# Patient Record
Sex: Male | Born: 1935 | Race: White | Hispanic: No | Smoking: Former smoker
Health system: Southern US, Community
[De-identification: ages and names within clinical notes are randomized; demographics above are authoritative.]

## PROBLEM LIST (undated history)

## (undated) DIAGNOSIS — I4891 Unspecified atrial fibrillation: Secondary | ICD-10-CM

## (undated) DIAGNOSIS — E785 Hyperlipidemia, unspecified: Secondary | ICD-10-CM

## (undated) DIAGNOSIS — M1990 Unspecified osteoarthritis, unspecified site: Secondary | ICD-10-CM

## (undated) DIAGNOSIS — IMO0001 Reserved for inherently not codable concepts without codable children: Secondary | ICD-10-CM

## (undated) DIAGNOSIS — C8590 Non-Hodgkin lymphoma, unspecified, unspecified site: Secondary | ICD-10-CM

## (undated) DIAGNOSIS — I10 Essential (primary) hypertension: Secondary | ICD-10-CM

## (undated) DIAGNOSIS — Z9089 Acquired absence of other organs: Secondary | ICD-10-CM

## (undated) DIAGNOSIS — I2510 Atherosclerotic heart disease of native coronary artery without angina pectoris: Secondary | ICD-10-CM

## (undated) DIAGNOSIS — N400 Enlarged prostate without lower urinary tract symptoms: Secondary | ICD-10-CM

## (undated) DIAGNOSIS — D0360 Melanoma in situ of unspecified upper limb, including shoulder: Secondary | ICD-10-CM

## (undated) DIAGNOSIS — I629 Nontraumatic intracranial hemorrhage, unspecified: Secondary | ICD-10-CM

## (undated) DIAGNOSIS — D696 Thrombocytopenia, unspecified: Secondary | ICD-10-CM

## (undated) DIAGNOSIS — E119 Type 2 diabetes mellitus without complications: Secondary | ICD-10-CM

## (undated) HISTORY — DX: Non-Hodgkin lymphoma, unspecified, unspecified site: C85.90

## (undated) HISTORY — DX: Hyperlipidemia, unspecified: E78.5

## (undated) HISTORY — DX: Type 2 diabetes mellitus without complications: E11.9

## (undated) HISTORY — DX: Essential (primary) hypertension: I10

## (undated) HISTORY — DX: Acquired absence of other organs: Z90.89

## (undated) HISTORY — DX: Thrombocytopenia, unspecified: D69.6

## (undated) HISTORY — DX: Nontraumatic intracranial hemorrhage, unspecified: I62.9

## (undated) HISTORY — DX: Melanoma in situ of unspecified upper limb, including shoulder: D03.60

## (undated) HISTORY — DX: Reserved for inherently not codable concepts without codable children: IMO0001

## (undated) HISTORY — PX: CHOLECYSTECTOMY: SHX55

## (undated) HISTORY — PX: TONSILLECTOMY AND ADENOIDECTOMY: SUR1326

## (undated) HISTORY — DX: Unspecified atrial fibrillation: I48.91

## (undated) HISTORY — DX: Atherosclerotic heart disease of native coronary artery without angina pectoris: I25.10

## (undated) HISTORY — DX: Unspecified osteoarthritis, unspecified site: M19.90

## (undated) HISTORY — PX: CATARACT EXTRACTION: SUR2

## (undated) HISTORY — DX: Benign prostatic hyperplasia without lower urinary tract symptoms: N40.0

---
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
## 2002-07-11 ENCOUNTER — Encounter: Payer: Self-pay | Admitting: Internal Medicine

## 2005-07-14 ENCOUNTER — Encounter: Payer: Self-pay | Admitting: Internal Medicine

## 2005-12-28 ENCOUNTER — Ambulatory Visit: Payer: Self-pay | Admitting: Internal Medicine

## 2006-02-01 ENCOUNTER — Ambulatory Visit: Payer: Self-pay | Admitting: Cardiology

## 2006-02-22 ENCOUNTER — Ambulatory Visit: Payer: Self-pay

## 2006-02-22 ENCOUNTER — Encounter: Payer: Self-pay | Admitting: Internal Medicine

## 2006-03-30 ENCOUNTER — Ambulatory Visit: Payer: Self-pay | Admitting: Internal Medicine

## 2006-05-29 ENCOUNTER — Encounter: Payer: Self-pay | Admitting: Internal Medicine

## 2006-07-28 DIAGNOSIS — I4891 Unspecified atrial fibrillation: Secondary | ICD-10-CM | POA: Insufficient documentation

## 2006-07-28 DIAGNOSIS — E785 Hyperlipidemia, unspecified: Secondary | ICD-10-CM

## 2006-07-28 DIAGNOSIS — E1142 Type 2 diabetes mellitus with diabetic polyneuropathy: Secondary | ICD-10-CM | POA: Insufficient documentation

## 2006-07-28 DIAGNOSIS — I10 Essential (primary) hypertension: Secondary | ICD-10-CM

## 2006-07-28 DIAGNOSIS — I2510 Atherosclerotic heart disease of native coronary artery without angina pectoris: Secondary | ICD-10-CM

## 2006-07-28 DIAGNOSIS — N138 Other obstructive and reflux uropathy: Secondary | ICD-10-CM

## 2006-07-28 DIAGNOSIS — N401 Enlarged prostate with lower urinary tract symptoms: Secondary | ICD-10-CM

## 2006-08-03 ENCOUNTER — Ambulatory Visit: Payer: Self-pay | Admitting: Internal Medicine

## 2006-08-04 LAB — CONVERTED CEMR LAB
ALT: 20 units/L (ref 0–40)
BUN: 10 mg/dL (ref 6–23)
CO2: 33 meq/L — ABNORMAL HIGH (ref 19–32)
Calcium: 9.7 mg/dL (ref 8.4–10.5)
Chloride: 100 meq/L (ref 96–112)
Cholesterol: 151 mg/dL (ref 0–200)
Creatinine, Ser: 0.9 mg/dL (ref 0.4–1.5)
GFR calc Af Amer: 104 mL/min
GFR calc non Af Amer: 86 mL/min
Glucose, Bld: 83 mg/dL (ref 70–99)
HDL: 47.1 mg/dL (ref >39.0)
Hgb A1c MFr Bld: 6.1 % — ABNORMAL HIGH (ref 4.6–6.0)
LDL Cholesterol: 88 mg/dL (ref 0–99)
Potassium: 4 meq/L (ref 3.5–5.1)
Sodium: 139 meq/L (ref 135–145)
Total CHOL/HDL Ratio: 3.2
Triglycerides: 82 mg/dL (ref 0–149)
VLDL: 16 mg/dL (ref 0–40)

## 2006-12-05 ENCOUNTER — Ambulatory Visit: Payer: Self-pay | Admitting: Internal Medicine

## 2006-12-06 LAB — CONVERTED CEMR LAB
Albumin: 4.6 g/dL (ref 3.5–5.2)
BUN: 8 mg/dL (ref 6–23)
CO2: 33 meq/L — ABNORMAL HIGH (ref 19–32)
Calcium: 9.9 mg/dL (ref 8.4–10.5)
Chloride: 97 meq/L (ref 96–112)
Creatinine, Ser: 0.7 mg/dL (ref 0.4–1.5)
Creatinine,U: 18 mg/dL
GFR calc Af Amer: 140 mL/min
GFR calc non Af Amer: 115 mL/min
Glucose, Bld: 91 mg/dL (ref 70–99)
Hgb A1c MFr Bld: 5.9 % (ref 4.6–6.0)
Microalb Creat Ratio: 11.1 mg/g (ref 0.0–30.0)
Microalb, Ur: 0.2 mg/dL (ref 0.0–1.9)
Phosphorus: 3.7 mg/dL (ref 2.3–4.6)
Potassium: 4.2 meq/L (ref 3.5–5.1)
Sodium: 136 meq/L (ref 135–145)

## 2007-01-05 ENCOUNTER — Encounter: Payer: Self-pay | Admitting: Internal Medicine

## 2007-01-29 ENCOUNTER — Ambulatory Visit: Payer: Self-pay | Admitting: Internal Medicine

## 2007-04-10 ENCOUNTER — Ambulatory Visit: Payer: Self-pay | Admitting: Internal Medicine

## 2007-04-11 LAB — CONVERTED CEMR LAB
ALT: 19 units/L (ref 0–53)
AST: 25 units/L (ref 0–37)
Albumin: 4.8 g/dL (ref 3.5–5.2)
Alkaline Phosphatase: 41 units/L (ref 39–117)
BUN: 6 mg/dL (ref 6–23)
Bilirubin, Direct: 0.2 mg/dL (ref 0.0–0.3)
CO2: 34 meq/L — ABNORMAL HIGH (ref 19–32)
Calcium: 10 mg/dL (ref 8.4–10.5)
Chloride: 98 meq/L (ref 96–112)
Cholesterol: 140 mg/dL (ref 0–200)
Creatinine, Ser: 0.9 mg/dL (ref 0.4–1.5)
GFR calc Af Amer: 104 mL/min
GFR calc non Af Amer: 86 mL/min
Glucose, Bld: 87 mg/dL (ref 70–99)
HDL: 49.8 mg/dL (ref >39.0)
Hgb A1c MFr Bld: 5.9 % (ref 4.6–6.0)
LDL Cholesterol: 75 mg/dL (ref 0–99)
Phosphorus: 3.7 mg/dL (ref 2.3–4.6)
Potassium: 4.4 meq/L (ref 3.5–5.1)
Sodium: 139 meq/L (ref 135–145)
Total Bilirubin: 1.1 mg/dL (ref 0.3–1.2)
Total CHOL/HDL Ratio: 2.8
Total Protein: 7.2 g/dL (ref 6.0–8.3)
Triglycerides: 76 mg/dL (ref 0–149)
VLDL: 15 mg/dL (ref 0–40)

## 2007-05-09 ENCOUNTER — Ambulatory Visit: Payer: Self-pay | Admitting: Cardiology

## 2007-08-10 ENCOUNTER — Ambulatory Visit: Payer: Self-pay | Admitting: Internal Medicine

## 2007-08-14 LAB — CONVERTED CEMR LAB
ALT: 18 units/L (ref 0–53)
AST: 23 units/L (ref 0–37)
Albumin: 4.8 g/dL (ref 3.5–5.2)
Alkaline Phosphatase: 39 units/L (ref 39–117)
BUN: 8 mg/dL (ref 6–23)
Basophils Absolute: 0 10*3/uL (ref 0.0–0.1)
Basophils Relative: 0.3 % (ref 0.0–1.0)
Bilirubin, Direct: 0.1 mg/dL (ref 0.0–0.3)
CO2: 32 meq/L (ref 19–32)
Calcium: 9.9 mg/dL (ref 8.4–10.5)
Chloride: 98 meq/L (ref 96–112)
Creatinine, Ser: 0.8 mg/dL (ref 0.4–1.5)
Eosinophils Absolute: 0 10*3/uL (ref 0.0–0.7)
Eosinophils Relative: 1 % (ref 0.0–5.0)
GFR calc Af Amer: 119 mL/min
GFR calc non Af Amer: 99 mL/min
Glucose, Bld: 79 mg/dL (ref 70–99)
HCT: 44.8 % (ref 39.0–52.0)
Hemoglobin: 15.7 g/dL (ref 13.0–17.0)
Hgb A1c MFr Bld: 6.1 % — ABNORMAL HIGH (ref 4.6–6.0)
Lymphocytes Relative: 39.7 % (ref 12.0–46.0)
MCHC: 35 g/dL (ref 30.0–36.0)
MCV: 96.3 fL (ref 78.0–100.0)
Monocytes Absolute: 0.6 10*3/uL (ref 0.1–1.0)
Monocytes Relative: 14.6 % — ABNORMAL HIGH (ref 3.0–12.0)
Neutro Abs: 2 10*3/uL (ref 1.4–7.7)
Neutrophils Relative %: 44.4 % (ref 43.0–77.0)
Phosphorus: 3 mg/dL (ref 2.3–4.6)
Platelets: 165 10*3/uL (ref 150–400)
Potassium: 4.2 meq/L (ref 3.5–5.1)
RBC: 4.65 M/uL (ref 4.22–5.81)
RDW: 12.3 % (ref 11.5–14.6)
Sodium: 140 meq/L (ref 135–145)
TSH: 0.66 microintl units/mL (ref 0.35–5.50)
Total Bilirubin: 1.1 mg/dL (ref 0.3–1.2)
Total Protein: 7.4 g/dL (ref 6.0–8.3)
WBC: 4.3 10*3/uL — ABNORMAL LOW (ref 4.5–10.5)

## 2007-12-10 ENCOUNTER — Ambulatory Visit: Payer: Self-pay | Admitting: Internal Medicine

## 2008-04-11 ENCOUNTER — Ambulatory Visit: Payer: Self-pay | Admitting: Internal Medicine

## 2008-04-14 LAB — CONVERTED CEMR LAB
ALT: 16 U/L
AST: 21 U/L
Albumin: 4.5 g/dL
Alkaline Phosphatase: 39 U/L
BUN: 11 mg/dL
Basophils Absolute: 0 K/uL
Basophils Relative: 0.4 %
Bilirubin, Direct: 0.2 mg/dL
CO2: 32 meq/L
Calcium: 9.6 mg/dL
Chloride: 99 meq/L
Cholesterol: 128 mg/dL
Creatinine, Ser: 0.8 mg/dL
Creatinine,U: 32.1 mg/dL
Eosinophils Absolute: 0 K/uL
Eosinophils Relative: 0.7 %
GFR calc Af Amer: 119 mL/min
GFR calc non Af Amer: 99 mL/min
Glucose, Bld: 89 mg/dL
HCT: 43.3 %
HDL: 44.7 mg/dL
Hemoglobin: 15 g/dL
Hgb A1c MFr Bld: 6.1 % — ABNORMAL HIGH
LDL Cholesterol: 69 mg/dL
Lymphocytes Relative: 36.1 %
MCHC: 34.7 g/dL
MCV: 97 fL
Microalb Creat Ratio: 24.9 mg/g
Microalb, Ur: 0.8 mg/dL
Monocytes Absolute: 0.6 K/uL
Monocytes Relative: 13.2 % — ABNORMAL HIGH
Neutro Abs: 2.3 K/uL
Neutrophils Relative %: 49.6 %
Phosphorus: 3.9 mg/dL
Platelets: 156 K/uL
Potassium: 4.3 meq/L
RBC: 4.46 M/uL
RDW: 12.3 %
Sodium: 137 meq/L
TSH: 2.23 u[IU]/mL
Total Bilirubin: 1.1 mg/dL
Total CHOL/HDL Ratio: 2.9
Total Protein: 6.7 g/dL
Triglycerides: 71 mg/dL
VLDL: 14 mg/dL
WBC: 4.5 10*3/microliter

## 2008-05-06 ENCOUNTER — Ambulatory Visit: Payer: Self-pay | Admitting: Cardiology

## 2008-06-12 ENCOUNTER — Telehealth: Payer: Self-pay | Admitting: Internal Medicine

## 2008-08-04 ENCOUNTER — Ambulatory Visit: Payer: Self-pay | Admitting: Internal Medicine

## 2008-08-29 ENCOUNTER — Ambulatory Visit: Payer: Self-pay | Admitting: Family Medicine

## 2009-01-16 ENCOUNTER — Ambulatory Visit: Payer: Self-pay | Admitting: Internal Medicine

## 2009-01-19 LAB — CONVERTED CEMR LAB
ALT: 23 units/L (ref 0–53)
AST: 31 units/L (ref 0–37)
Alkaline Phosphatase: 40 units/L (ref 39–117)
BUN: 8 mg/dL (ref 6–23)
Basophils Absolute: 0 10*3/uL (ref 0.0–0.1)
Bilirubin, Direct: 0.2 mg/dL (ref 0.0–0.3)
Calcium: 9.5 mg/dL (ref 8.4–10.5)
Cholesterol: 139 mg/dL (ref 0–200)
Creatinine, Ser: 0.7 mg/dL (ref 0.4–1.5)
Eosinophils Absolute: 0 10*3/uL (ref 0.0–0.7)
GFR calc non Af Amer: 114.55 mL/min (ref >60)
Glucose, Bld: 92 mg/dL (ref 70–99)
Lymphocytes Relative: 29.8 % (ref 12.0–46.0)
MCHC: 34.6 g/dL (ref 30.0–36.0)
Monocytes Relative: 12.6 % — ABNORMAL HIGH (ref 3.0–12.0)
Phosphorus: 3.9 mg/dL (ref 2.3–4.6)
Platelets: 144 10*3/uL — ABNORMAL LOW (ref 150.0–400.0)
Potassium: 4.1 meq/L (ref 3.5–5.1)
RDW: 12.5 % (ref 11.5–14.6)
TSH: 2.47 microintl units/mL (ref 0.35–5.50)
Total Protein: 7 g/dL (ref 6.0–8.3)
VLDL: 14.6 mg/dL (ref 0.0–40.0)

## 2009-04-06 ENCOUNTER — Encounter: Payer: Self-pay | Admitting: Internal Medicine

## 2009-06-05 ENCOUNTER — Ambulatory Visit: Payer: Self-pay | Admitting: Cardiovascular Disease

## 2009-06-09 ENCOUNTER — Telehealth: Payer: Self-pay | Admitting: Internal Medicine

## 2009-06-10 ENCOUNTER — Ambulatory Visit: Payer: Self-pay | Admitting: Internal Medicine

## 2009-06-19 ENCOUNTER — Ambulatory Visit: Payer: Self-pay | Admitting: Cardiovascular Disease

## 2009-08-07 ENCOUNTER — Ambulatory Visit: Payer: Self-pay | Admitting: Internal Medicine

## 2009-08-11 LAB — CONVERTED CEMR LAB
Basophils Relative: 0.5 % (ref 0.0–3.0)
Eosinophils Absolute: 0 10*3/uL (ref 0.0–0.7)
Eosinophils Relative: 0.7 % (ref 0.0–5.0)
Hgb A1c MFr Bld: 5.8 % (ref 4.6–6.5)
Lymphocytes Relative: 34 % (ref 12.0–46.0)
MCHC: 34.2 g/dL (ref 30.0–36.0)
MCV: 97.6 fL (ref 78.0–100.0)
Monocytes Absolute: 0.6 10*3/uL (ref 0.1–1.0)
Neutrophils Relative %: 50.3 % (ref 43.0–77.0)
Platelets: 132 10*3/uL — ABNORMAL LOW (ref 150.0–400.0)
RBC: 4.24 M/uL (ref 4.22–5.81)
WBC: 3.9 10*3/uL — ABNORMAL LOW (ref 4.5–10.5)

## 2009-10-08 ENCOUNTER — Encounter: Payer: Self-pay | Admitting: Internal Medicine

## 2009-11-03 ENCOUNTER — Ambulatory Visit: Payer: Self-pay | Admitting: Cardiovascular Disease

## 2009-12-14 ENCOUNTER — Ambulatory Visit: Payer: Self-pay | Admitting: Internal Medicine

## 2010-01-04 ENCOUNTER — Telehealth: Payer: Self-pay | Admitting: Internal Medicine

## 2010-01-04 ENCOUNTER — Telehealth: Payer: Self-pay | Admitting: Cardiovascular Disease

## 2010-01-08 ENCOUNTER — Encounter: Payer: Self-pay | Admitting: Internal Medicine

## 2010-03-22 ENCOUNTER — Encounter: Payer: Self-pay | Admitting: Internal Medicine

## 2010-03-22 LAB — HM DIABETES EYE EXAM

## 2010-03-31 ENCOUNTER — Other Ambulatory Visit: Payer: Self-pay | Admitting: Internal Medicine

## 2010-03-31 ENCOUNTER — Ambulatory Visit
Admission: RE | Admit: 2010-03-31 | Discharge: 2010-03-31 | Payer: Self-pay | Source: Home / Self Care | Attending: Internal Medicine | Admitting: Internal Medicine

## 2010-03-31 LAB — CBC WITH DIFFERENTIAL/PLATELET
Basophils Absolute: 0 10*3/uL (ref 0.0–0.1)
Basophils Relative: 0.4 % (ref 0.0–3.0)
Eosinophils Absolute: 0 10*3/uL (ref 0.0–0.7)
Eosinophils Relative: 1 % (ref 0.0–5.0)
HCT: 38.5 % — ABNORMAL LOW (ref 39.0–52.0)
Hemoglobin: 13.7 g/dL (ref 13.0–17.0)
Lymphocytes Relative: 33.7 % (ref 12.0–46.0)
Lymphs Abs: 1.5 10*3/uL (ref 0.7–4.0)
MCHC: 35.6 g/dL (ref 30.0–36.0)
MCV: 96.5 fl (ref 78.0–100.0)
Monocytes Absolute: 0.5 10*3/uL (ref 0.1–1.0)
Monocytes Relative: 12.4 % — ABNORMAL HIGH (ref 3.0–12.0)
Neutro Abs: 2.3 10*3/uL (ref 1.4–7.7)
Neutrophils Relative %: 52.5 % (ref 43.0–77.0)
Platelets: 119 10*3/uL — ABNORMAL LOW (ref 150.0–400.0)
RBC: 3.99 Mil/uL — ABNORMAL LOW (ref 4.22–5.81)
RDW: 13.7 % (ref 11.5–14.6)
WBC: 4.3 10*3/uL — ABNORMAL LOW (ref 4.5–10.5)

## 2010-03-31 LAB — RENAL FUNCTION PANEL
Albumin: 4.5 g/dL (ref 3.5–5.2)
BUN: 11 mg/dL (ref 6–23)
CO2: 33 mEq/L — ABNORMAL HIGH (ref 19–32)
Calcium: 9.6 mg/dL (ref 8.4–10.5)
Chloride: 96 mEq/L (ref 96–112)
Creatinine, Ser: 0.6 mg/dL (ref 0.4–1.5)
GFR: 144.77 mL/min (ref >60.00)
Glucose, Bld: 79 mg/dL (ref 70–99)
Phosphorus: 3.6 mg/dL (ref 2.3–4.6)
Potassium: 4.8 mEq/L (ref 3.5–5.1)
Sodium: 135 mEq/L (ref 135–145)

## 2010-03-31 LAB — TSH: TSH: 2.21 u[IU]/mL (ref 0.35–5.50)

## 2010-03-31 LAB — LIPID PANEL
Cholesterol: 122 mg/dL (ref 0–200)
HDL: 54 mg/dL (ref >39.00)
LDL Cholesterol: 58 mg/dL (ref 0–99)
Total CHOL/HDL Ratio: 2
Triglycerides: 51 mg/dL (ref 0.0–149.0)
VLDL: 10.2 mg/dL (ref 0.0–40.0)

## 2010-03-31 LAB — HM DIABETES FOOT EXAM

## 2010-03-31 LAB — HEPATIC FUNCTION PANEL
ALT: 19 U/L (ref 0–53)
AST: 22 U/L (ref 0–37)
Albumin: 4.5 g/dL (ref 3.5–5.2)
Alkaline Phosphatase: 39 U/L (ref 39–117)
Bilirubin, Direct: 0.2 mg/dL (ref 0.0–0.3)
Total Bilirubin: 1.1 mg/dL (ref 0.3–1.2)
Total Protein: 6.7 g/dL (ref 6.0–8.3)

## 2010-03-31 LAB — HEMOGLOBIN A1C: Hgb A1c MFr Bld: 5.9 % (ref 4.6–6.5)

## 2010-04-13 NOTE — Assessment & Plan Note (Signed)
Summary: PULLED MUSCLE, OK PER DEE/NT   Vital Signs:  Patient profile:   77 year old male Weight:      140 pounds Temp:     98 degrees F oral BP sitting:   120 / 68  (left arm) Cuff size:   large  Vitals Entered By: Mervin Hack CMA Duncan Dull) (June 10, 2009 12:52 PM) CC: pulled muscle   History of Present Illness: Doing exercises about 1 month ago Noted sudden pain in right low back Felt it would just improve but it is worsening  Gets intermittent sharp, stabbing pain that are more severe Grabbing feeling hard to walk at times  No radiation up back or to legs No leg weakness  Hasn't used any meds, heat, etc  Allergies: No Known Drug Allergies  Past History:  Past medical, surgical, family and social histories (including risk factors) reviewed for relevance to current acute and chronic problems.  Past Medical History: Reviewed history from 08/10/2007 and no changes required. Atrial fibrillation--1980's--------------------------------------------Dr Wall Coronary artery disease--1995 Hyperlipidemia--1990's Hypertension--1990's Benign prostatic hypertrophy------------------------------------Dr Cope Lymphoma(?)--2002 NIDDM with neuropathy----------------------------------------Dr [facility] (podiatry)  Past Surgical History: Reviewed history from 07/28/2006 and no changes required. 1931 T&A 1949 LIH 1982--Cholecystectomy 1980's--Cranial hemorrhage while on coumadin 1986--melanoma (L shoulder) 1999--several nevi removed 2002--lumps on head (cancer?)  Family History: Reviewed history from 07/28/2006 and no changes required. Dad died @91  old age, had prostate cancer Mom died@65   CHF?, DM, HTN 1 sister with arthritis No history of colon cancer  Social History: Reviewed history from 12/31/2008 and no changes required. Producer, television/film/video Married--3 sons Mila [place], [place], [place]) Former Smoker--quit 1982, 20 pack year history Alcohol  use-yes Enjoys golf and bowling Regular Exercise - yes Drug Use - no  Review of Systems       No change in bowel or bladder habits  Physical Exam  General:  alert.  NAD Msk:  no spine tenderness mild spasm and tenderness along lumbar paraspinals SLR negative bilaterally fairly normal back flexion Extremities:  no edema Neurologic:  strength normal in all extremities and DTRs symmetrical and normal.   Gait not ataxic but leans forward   Impression & Recommendations:  Problem # 1:  BACK PAIN (ICD-724.5) Assessment New  clearly seems to be just muscular will try heat, very short course of muscle relaxer and NSAIDs if not improving, will make PT referral  Orders: Prescription Created Electronically [phone])  Complete Medication List: 1)  Multivitamins Caps (Multiple vitamin) .Marland Kitchen.. 1 daily 2)  Diltiazem Hcl Cr 120 Mg Xr12h-cap (Diltiazem hcl) .Marland Kitchen.. 1 tab by mouth daily 3)  Aspirin 325 Mg Tabs (Aspirin) .Marland Kitchen.. 1 daily 4)  Hydrochlorothiazide 25 Mg Tabs (Hydrochlorothiazide) .Marland Kitchen.. 1 daily 5)  Crestor 5 Mg Tabs (Rosuvastatin calcium) .Marland Kitchen.. 1 every other day 6)  Lisinopril 20 Mg Tabs (Lisinopril) .Marland Kitchen.. 1 daily 7)  Onetouch Ultra Test Strp (Glucose blood) .... Use as directed, dx: 250.00 patient tests once daily. 8)  Onetouch Ultra Control Soln (Blood glucose calibration) .... Use as directed 9)  Onetouch Ultrasoft Lancets Misc (Lancets) .... Use as directed dx: 250.00 patient tests once daily 10)  Chlorzoxazone 500 Mg Tabs (Chlorzoxazone) .Marland Kitchen.. 1 tab by mouth three times a day as needed for muscle spasm  Patient Instructions: 1)  Please try a heat rub over the painful area--like ben gay or icy hot 2)  Please try ibuprofen --  400mg  (2 of the 200mg  tabs) three times a day after meals for 1-2 weeks 3)  use the prescription muscle  relaxer if you get the sharp pains 4)  If the pain is no better in 1-2 weeks, call for referral to the physical therapists on campus for treatment 5)  Keep  your May appt Prescriptions: CHLORZOXAZONE 500 MG TABS (CHLORZOXAZONE) 1 tab by mouth three times a day as needed for muscle spasm  #30 x 0   Entered and Authorized by:   Cindee Salt MD   Signed by:   Cindee Salt MD on 06/10/2009   Method used:   Electronically to        [facility] [phone]* (retail)       [address] [place], [place]  [address]       Ph: [phone]       Fax: [phone]   RxID:   [id number]   Current Allergies (reviewed today): No known allergies

## 2010-04-13 NOTE — Letter (Signed)
Summary: [place] [facility]  [place] [facility]   Imported By: Maryln Gottron 10/12/2009 15:04:18  _____________________________________________________________________  External Attachment:    Type:   Image     Comment:   External Document

## 2010-04-13 NOTE — Progress Notes (Signed)
Summary: RX  Phone Note Refill Request Call back at Home Phone [phone] Message from:  Patient on January 04, 2010 8:46 AM  Refills Requested: Medication #1:  DILTIAZEM HCL 30 MG TABS Take 1 tablet by mouth two times a day   Notes: 90 DAY SUPPLY FOR 1 YEAR SEND IT TO THE PT'S HOME ADDRESS  Initial call taken by: Harlon Flor,  January 04, 2010 8:47 AM    Prescriptions: DILTIAZEM HCL 30 MG TABS (DILTIAZEM HCL) Take 1 tablet by mouth two times a day  #180 x 3   Entered by:   Bishop Dublin, CMA   Authorized by:   Dossie Arbour MD   Signed by:   Bishop Dublin, CMA on 01/04/2010   Method used:   Print then Give to Patient   RxID:   [id number]

## 2010-04-13 NOTE — Assessment & Plan Note (Signed)
Summary: EKG/AMD  Nurse Visit   Allergies: No Known Drug Allergies  Orders Added: 1)  EKG w/ Interpretation [93000]  Appended Document: EKG/AMD EKG looks better per Dr Mariah Milling, called pt to let him know and check on dizziness.  pt states dizziness is better. told pt to cont current regiment. Cala Bradford :)

## 2010-04-13 NOTE — Progress Notes (Signed)
Summary: shingles vaccine  Phone Note Call from Patient Call back at Home Phone [phone]   Caller: Patient Call For: Cindee Salt MD Summary of Call: Patient is asking for a rx for the shingles vaccine be faxed  to [facility] on [address]  Initial call taken by: Melody Comas,  January 04, 2010 4:47 PM  Follow-up for Phone Call        [facility] [place], Spoke with patient and advised results.  Follow-up by: Mervin Hack CMA Duncan Dull),  January 04, 2010 5:05 PM    New/Updated Medications: ZOSTAVAX [id number] UNT/0.65ML SOLR (ZOSTER VACCINE LIVE) inject 0.32ml IM Prescriptions: ZOSTAVAX [id number] UNT/0.65ML SOLR (ZOSTER VACCINE LIVE) inject 0.28ml IM  #1 x 0   Entered by:   Mervin Hack CMA (AAMA)   Authorized by:   Cindee Salt MD   Signed by:   Mervin Hack CMA (AAMA) on 01/04/2010   Method used:   Electronically to        [facility]. [address] [phone]* (retail)       [address] [place], [place]  [address]       Ph: [phone]       Fax: [phone]   RxID:   [id number]

## 2010-04-13 NOTE — Letter (Signed)
Summary: [facility]  [facility]   Imported By: Lanelle Bal 05/05/2009 09:40:11  _____________________________________________________________________  External Attachment:    Type:   Image     Comment:   External Document  Appended Document: [facility]    Clinical Lists Changes  Observations: Added new observation of DIAB EYE EX: No diabetic retinopathy.   Cataracts noted and referral made (04/26/2009 20:12)       Diabetic Eye Exam  Procedure date:  04/26/2009  Findings:      No diabetic retinopathy.   Cataracts noted and referral made

## 2010-04-13 NOTE — Miscellaneous (Signed)
Summary: ZOSTAVAX  Clinical Lists Changes  Observations: Added new observation of ZOSTAVAX: Zostavax (01/05/2010 9:46)      Other Immunization History:    Zostavax # 1:  Zostavax (01/05/2010)

## 2010-04-13 NOTE — Assessment & Plan Note (Signed)
Summary: ec6   Visit Type:  Follow-up Referring Provider:  WALL Primary Provider:  Cindee Salt MD  CC:  gets slightly lightheaded with quick changes in positions.  History of Present Illness: Jimmy Riggs is a patient of Dr. Alphonsus Sias with history of chronic atrial fibrillation, hypertension, hyperlipidemia, nonobstructive coronary artery disease and a negative stress test in December 2007 with ejection fraction 65% who presents for routine followup. He was last seen February 2010 by Dr. wall.  He states that he's been doing well he does have episodes of dizziness sometimes when he walks. He tries to walk at least a mile several times per week. He denies any chest pain, syncope, diaphoresis. No significant lower extremity edema. He's been taking his medications without any difficulty.  Labs from 2010 shows well-controlled cholesterol of 130, LDL 70, HDL greater than 40  Current Problems (verified): 1)  Uri  (ICD-465.9) 2)  Chest Pain  (ICD-786.50) 3)  Carbuncle and Furuncle of Buttock  (ICD-680.5) 4)  Diabetes Mellitus, Type II, With Neurological Complications  (ICD-250.60) 5)  Benign Prostatic Hypertrophy  (ICD-600.00) 6)  Hypertension  (ICD-401.9) 7)  Hyperlipidemia  (ICD-272.4) 8)  Coronary Artery Disease  (ICD-414.00) 9)  Atrial Fibrillation  (ICD-427.31)  Current Medications (verified): 1)  Multivitamins  Caps (Multiple Vitamin) .Marland Kitchen.. 1 Daily 2)  Cartia Xt 240 Mg Cp24 (Diltiazem Hcl Coated Beads) .Marland Kitchen.. 1 Daily 3)  Aspirin 325 Mg Tabs (Aspirin) .Marland Kitchen.. 1 Daily 4)  Hydrochlorothiazide 25 Mg Tabs (Hydrochlorothiazide) .Marland Kitchen.. 1 Daily 5)  Crestor 5 Mg Tabs (Rosuvastatin Calcium) .Marland Kitchen.. 1 Every Other Day 6)  Lisinopril 20 Mg Tabs (Lisinopril) .Marland Kitchen.. 1 Daily 7)  Onetouch Ultra Test  Strp (Glucose Blood) .... Use As Directed, Dx: 250.00 Patient Tests Once Daily. 8)  Onetouch Ultra Control  Soln (Blood Glucose Calibration) .... Use As Directed 9)  Onetouch Ultrasoft Lancets  Misc (Lancets)  .... Use As Directed Dx: 250.00 Patient Tests Once Daily  Allergies (verified): No Known Drug Allergies  Review of Systems  The patient denies fever, weight loss, weight gain, vision loss, decreased hearing, hoarseness, chest pain, syncope, dyspnea on exertion, peripheral edema, prolonged cough, abdominal pain, incontinence, muscle weakness, depression, and enlarged lymph nodes.         Dizziness  Vital Signs:  Patient profile:   77 year old male Height:      66 inches Weight:      145.50 pounds BMI:     23.57 Pulse rate:   51 / minute Pulse rhythm:   regular BP sitting:   130 / 58  (left arm) Cuff size:   regular  Vitals Entered By: Charlena Cross, RN, BSN (June 05, 2009 9:53 AM)  Physical Exam  General:  elderly gentleman in no apparent distress, HEENT exam is benign, oropharynx is clear, neck is supple with no JVP or carotid bruits, heart sounds are regular with normal S1-S2 and no murmurs appreciated, lungs are clear to auscultation with no wheezes Rales, abdominal exam is benign, no significant lower extremity edema, neurologic exam is nonfocal skin is dry, pulses are equal and symmetrical in his upper and lower extremities.    EKG  Procedure date:  06/05/2009  Findings:      atrial fibrillation with ventricular rate 51 beats per minute, no significant ST or T wave changes noted, left anterior fascicular block.  Impression & Recommendations:  Problem # 1:  ATRIAL FIBRILLATION (ICD-427.31) he is bradycardic today, has episodes of dizziness. Previous heart rates last year was  55. We will decrease his Cardizem from 240 mg daily to 120 mg daily.we have suggested that he have a repeat EKG in 2 weeks' time to make sure that his rate has improved.  His updated medication list for this problem includes:    Aspirin 325 Mg Tabs (Aspirin) .Marland Kitchen... 1 daily  Problem # 2:  HYPERTENSION (ICD-401.9) History of hypertension though this is reasonably well controlled. We will cut  back on his diltiazem and monitor his blood pressure. I've asked him to help [place] with this at home.  His updated medication list for this problem includes:    Diltiazem Hcl Cr 120 Mg Xr12h-cap (Diltiazem hcl) .Marland Kitchen... 1 tab by mouth daily    Aspirin 325 Mg Tabs (Aspirin) .Marland Kitchen... 1 daily    Hydrochlorothiazide 25 Mg Tabs (Hydrochlorothiazide) .Marland Kitchen... 1 daily    Lisinopril 20 Mg Tabs (Lisinopril) .Marland Kitchen... 1 daily  Problem # 3:  CORONARY ARTERY DISEASE (ICD-414.00) He turned he takes Crestor with excellent lipid control. He is on aspirin he is a nonsmoker.  His updated medication list for this problem includes:    Diltiazem Hcl Cr 120 Mg Xr12h-cap (Diltiazem hcl) .Marland Kitchen... 1 tab by mouth daily    Aspirin 325 Mg Tabs (Aspirin) .Marland Kitchen... 1 daily    Lisinopril 20 Mg Tabs (Lisinopril) .Marland Kitchen... 1 daily  Patient Instructions: 1)  Your physician recommends that you schedule a follow-up appointment in: 2 weeks for EKG.  If everything looks ok we will see you in 6 months.  2)  Your physician has recommended you make the following change in your medication: decrease diltiazem to 120mg  daily Prescriptions: DILTIAZEM HCL CR 120 MG XR12H-CAP (DILTIAZEM HCL) 1 tab by mouth daily  #30 x 6   Entered by:   Charlena Cross, RN, BSN   Authorized by:   Dossie Arbour MD   Signed by:   Charlena Cross, RN, BSN on 06/05/2009   Method used:   Electronically to        [facility]. [address] [phone]* (retail)       [address] [place], [place]  [id number]       Ph: [phone]       Fax: [phone]   RxID:   [id number]

## 2010-04-13 NOTE — Assessment & Plan Note (Signed)
Summary: F6M/AMD   Visit Type:  Follow-up Referring Provider:  WALL Primary Provider:  Cindee Salt MD  CC:  "Doing well".  Denies chest pain or shortness of breath..  History of Present Illness: Jimmy Riggs is a patient of Dr. Alphonsus Sias with history of chronic atrial fibrillation, not on Coumadin due to a remote history of fall with subdural hematoma, hypertension, hyperlipidemia, nonobstructive coronary artery disease and a negative stress test in December 2007 with ejection fraction 65% who presents for routine followup. On his visit today, he seems to have problems with memory/recall.  he continues to have episodes of dizziness almost daily when he first wakes up in the morning. He goes for a walk for one to one and 1/2 miles and this seems to make his dizziness go away. Through the course of the day, he then typically feels better.  He denies any falls or balance problems.He denies any chest pain, syncope, diaphoresis. No significant lower extremity edema. He's been taking his medications without any difficulty.  Labs from 2010 shows well-controlled cholesterol of 130, LDL 70, HDL greater than 40  EKG shows atrial fibrillation with ventricular rate of 49 beats per minute, nonspecific T wave abnormality in leads V4 through V6  Current Medications (verified): 1)  Diltiazem Hcl Er Beads 240 Mg Xr24h-Cap (Diltiazem Hcl Er Beads) .... One Tablet Once Daily 2)  Aspirin 325 Mg Tabs (Aspirin) .Marland Kitchen.. 1 Daily 3)  Hydrochlorothiazide 25 Mg Tabs (Hydrochlorothiazide) .Marland Kitchen.. 1 Daily 4)  Crestor 5 Mg Tabs (Rosuvastatin Calcium) .Marland Kitchen.. 1 Every Other Day 5)  Lisinopril 20 Mg Tabs (Lisinopril) .Marland Kitchen.. 1 Daily 6)  Chlorzoxazone 500 Mg Tabs (Chlorzoxazone) .Marland Kitchen.. 1 Tab By Mouth Three Times A Day As Needed For Muscle Spasm 7)  Onetouch Ultra Test  Strp (Glucose Blood) .... Use As Directed, Dx: 250.00 Patient Tests Once Daily. 8)  Onetouch Ultra Control  Soln (Blood Glucose Calibration) .... Use As Directed 9)   Onetouch Ultrasoft Lancets  Misc (Lancets) .... Use As Directed Dx: 250.00 Patient Tests Once Daily 10)  Multivitamins  Caps (Multiple Vitamin) .Marland Kitchen.. 1 Daily  Allergies (verified): No Known Drug Allergies  Past History:  Past Medical History: Last updated: 08/07/2009 Atrial fibrillation--1980's--------------------------------------------Dr Wall Coronary artery disease--1995 Hyperlipidemia--1990's Hypertension--1990's Benign prostatic hypertrophy------------------------------------Dr Cope Lymphoma(?)--2002 NIDDM with neuropathy----------------------------------------Dr [facility] (podiatry) Osteoarthritis  Past Surgical History: Last updated: 08/07/2009 1931 T&A 1949 LIH 1982--Cholecystectomy 1980's--Cranial hemorrhage while on coumadin 1986--melanoma (L shoulder) 1999--several nevi removed 2002--lumps on head (cancer?) 2011 Cataracts OU  Family History: Last updated: 2006/08/19 Dad died @91  old age, had prostate cancer Mom died@65   CHF?, DM, HTN 1 sister with arthritis No history of colon cancer  Social History: Last updated: 12/31/2008 Retired--electrical engineer Married--3 sons Jerline Pain [place], [place], [place]) Former Smoker--quit 1982, 20 pack year history Alcohol use-yes Enjoys golf and bowling Regular Exercise - yes Drug Use - no  Risk Factors: Exercise: yes (12/31/2008)  Risk Factors: Smoking Status: quit (08/19/06)  Review of Systems  The patient denies fever, weight loss, weight gain, vision loss, decreased hearing, hoarseness, chest pain, syncope, dyspnea on exertion, peripheral edema, prolonged cough, abdominal pain, incontinence, muscle weakness, depression, and enlarged lymph nodes.         Dizzy in the AM almost daily  Vital Signs:  Patient profile:   77 year old male Height:      66 inches Weight:      140 pounds BMI:     22.68 Pulse rate:   68 / minute BP sitting:  151 / 86  (left arm) Cuff size:   regular  Vitals Entered By: Bishop Dublin, CMA (November 03, 2009 10:07 AM)  Physical Exam  General:  alert and normal appearance.   Head:  no maxillary sinus ttp Neck:  supple, no masses, no thyromegaly, no carotid bruits, and no cervical lymphadenopathy.   Lungs:  normal respiratory effort and normal breath sounds.   Heart:  normal rate, no murmur, no gallop, and irregular rhythm.   Abdomen:  soft and non-tender.   Msk:  Back normal, normal gait. Muscle strength and tone normal. Pulses:  pulses normal in all 4 extremities Extremities:  No clubbing or cyanosis. Neurologic:  Alert and oriented, poor memory Skin:  Intact without lesions or rashes. Psych:  Normal affect.   Impression & Recommendations:  Problem # 1:  ATRIAL FIBRILLATION (ICD-427.31) chronic atrial fibrillation, not on warfarin due to a history of falls. His rate is slow and likely contributing to his dizziness in the morning. Given his history of falls, we will decrease his diltiazem in an effort to increase his heart rate. We will change his Cardizem CD to  diltiazem 30 mg b.i.d. I have asked him to call me if he continues to have episodes of dizziness in the morning  His updated medication list for this problem includes:    Aspirin 325 Mg Tabs (Aspirin) .Marland Kitchen... 1 daily  Orders: EKG w/ Interpretation (93000)  Problem # 2:  CORONARY ARTERY DISEASE (ICD-414.00) Mild nonobstructive coronary artery disease. We will continue aggressive lipid management.  His updated medication list for this problem includes:    Diltiazem Hcl 30 Mg Tabs (Diltiazem hcl) .Marland Kitchen... Take 1 tablet by mouth two times a day    Aspirin 325 Mg Tabs (Aspirin) .Marland Kitchen... 1 daily    Lisinopril 20 Mg Tabs (Lisinopril) .Marland Kitchen... 1 daily  Orders: EKG w/ Interpretation (93000)  Problem # 3:  HYPERLIPIDEMIA (ICD-272.4) Cholesterol is well controlled on his current regimen of Crestor daily.  His updated medication list for this problem includes:    Crestor 5 Mg Tabs (Rosuvastatin calcium) .Marland Kitchen...  1 every other day  Problem # 4:  HYPERTENSION (ICD-401.9) We will continue to monitor his blood pressure and have asked him to do so at home. Interestingly, he takes his blood pressure everyday though he cannot remember what the numbers are. On repeat check, his systolic was 140, diastolic 85.  His updated medication list for this problem includes:    Diltiazem Hcl 30 Mg Tabs (Diltiazem hcl) .Marland Kitchen... Take 1 tablet by mouth two times a day    Aspirin 325 Mg Tabs (Aspirin) .Marland Kitchen... 1 daily    Hydrochlorothiazide 25 Mg Tabs (Hydrochlorothiazide) .Marland Kitchen... 1 daily    Lisinopril 20 Mg Tabs (Lisinopril) .Marland Kitchen... 1 daily  Patient Instructions: 1)  Your physician has recommended you make the following change in your medication: STOP diltiazem 240mg  START diltiazem 30mg  two times a day  2)  Your physician wants you to follow-up in:   6 months You will receive a reminder letter in the mail two months in advance. If you don't receive a letter, please call our office to schedule the follow-up appointment. 3)  Your physician has requested that you regularly monitor and record your blood pressure readings at home.  4)  Please call office if you are still dizzy after medication change.  Prescriptions: DILTIAZEM HCL 30 MG TABS (DILTIAZEM HCL) Take 1 tablet by mouth two times a day  #60 x 2   Entered by:  Benedict Needy, RN   Authorized by:   Dossie Arbour MD   Signed by:   Benedict Needy, RN on 11/03/2009   Method used:   Electronically to        [facility]. [address] [phone]* (retail)       [address] [place], [place]  [id number]       Ph: [phone]       Fax: [phone]   RxID:   [id number]

## 2010-04-13 NOTE — Assessment & Plan Note (Signed)
Summary: follow up   Vital Signs:  Patient profile:   77 year old male Height:      66 inches Weight:      139 pounds BMI:     22.52 Temp:     97.5 degrees F oral Pulse rate:   84 / minute Pulse rhythm:   regular BP sitting:   140 / 80  (left arm) Cuff size:   regular  Vitals Entered By: Delilah Shan CMA Duncan Dull) (Aug 07, 2009 10:36 AM) CC: Follow up   History of Present Illness: Doing about the same Had right then left cataract surgery Still with some blurriness up close ---needs reader  Heart has been fine on decreased meds No fainting or sig dizziness No palpitations  No chest pain No SOB  Checks sugars daily Highest is 130s No sores or pain in feet  Voiding okay nocturia x 1-2  Notes some pain in medial left knee Still able to walk okay--gets it irritated No limitatin of motion no swelling hasn't used anything for this  Allergies: No Known Drug Allergies  Past History:  Past medical, surgical, family and social histories (including risk factors) reviewed for relevance to current acute and chronic problems.  Past Medical History: Atrial fibrillation--1980's--------------------------------------------Dr Wall Coronary artery disease--1995 Hyperlipidemia--1990's Hypertension--1990's Benign prostatic hypertrophy------------------------------------Dr Cope Lymphoma(?)--2002 NIDDM with neuropathy----------------------------------------Dr Hyatt (podiatry) Osteoarthritis  Past Surgical History: 1931 T&A 1949 LIH 1982--Cholecystectomy 1980's--Cranial hemorrhage while on coumadin 1986--melanoma (L shoulder) 1999--several nevi removed 2002--lumps on head (cancer?) 2011 Cataracts OU  Family History: Reviewed history from 07/28/2006 and no changes required. Dad died @91  old age, had prostate cancer Mom died@65   CHF?, DM, HTN 1 sister with arthritis No history of colon cancer  Social History: Reviewed history from 12/31/2008 and no changes  required. Producer, television/film/video Married--3 sons Mila Merry, [place], [place]) Former Smoker--quit 1982, 20 pack year history Alcohol use-yes Enjoys golf and bowling Regular Exercise - yes Drug Use - no  Review of Systems       appetite is fine weight stable sleeps okay  Physical Exam  General:  alert and normal appearance.   Neck:  supple, no masses, no thyromegaly, no carotid bruits, and no cervical lymphadenopathy.   Lungs:  normal respiratory effort and normal breath sounds.   Heart:  normal rate, no murmur, no gallop, and irregular rhythm.   Abdomen:  soft and non-tender.   Msk:  left knee is thickened No effusion no meniscus or ligament findings full ROM Pulses:  1+ on right, trace to absent on left Extremities:  no edema Skin:  no suspicious lesions and no ulcerations.   Psych:  normally interactive, good eye contact, not anxious appearing, and not depressed appearing.    Diabetes Management Exam:    Foot Exam (with socks and/or shoes not present):       Sensory-Pinprick/Light touch:          Left medial foot (L-4): diminished          Left dorsal foot (L-5): diminished          Left lateral foot (S-1): diminished          Right medial foot (L-4): diminished          Right dorsal foot (L-5): diminished          Right lateral foot (S-1): diminished       Inspection:          Left foot: normal  Right foot: normal       Nails:          Left foot: fungal infection          Right foot: fungal infection   Impression & Recommendations:  Problem # 1:  OSTEOARTHRITIS (ICD-715.90) Assessment New in knee not really limiting discussed using tylenol as needed   Problem # 2:  DIABETES MELLITUS, TYPE II, WITH NEUROLOGICAL COMPLICATIONS (ICD-250.60) Assessment: Unchanged  will just check labs  His updated medication list for this problem includes:    Aspirin 325 Mg Tabs (Aspirin) .Marland Kitchen... 1 daily    Lisinopril 20 Mg Tabs (Lisinopril) .Marland Kitchen... 1  daily  Labs Reviewed: Creat: 0.7 (01/16/2009)     Last Eye Exam: No diabetic retinopathy.   Cataracts noted and referral made (04/26/2009) Reviewed HgBA1c results: 5.9 (01/16/2009)  6.1 (04/11/2008)  Orders: TLB-CBC Platelet - w/Differential (85025-CBCD) Venipuncture (16109) TLB-A1C / Hgb A1C (Glycohemoglobin) (83036-A1C)  Problem # 3:  HYPERTENSION (ICD-401.9) Assessment: Unchanged good control bradycardia improved on lower diltiazem  His updated medication list for this problem includes:    Diltiazem Hcl Cr 120 Mg Xr12h-cap (Diltiazem hcl) .Marland Kitchen... 1 tab by mouth daily    Hydrochlorothiazide 25 Mg Tabs (Hydrochlorothiazide) .Marland Kitchen... 1 daily    Lisinopril 20 Mg Tabs (Lisinopril) .Marland Kitchen... 1 daily  BP today: 140/80 Prior BP: 120/68 (06/10/2009)  Prior 10 Yr Risk Heart Disease: N/A (08/03/2006)  Labs Reviewed: K+: 4.1 (01/16/2009) Creat: : 0.7 (01/16/2009)   Chol: 139 (01/16/2009)   HDL: 50.80 (01/16/2009)   LDL: 74 (01/16/2009)   TG: 73.0 (01/16/2009)  Problem # 4:  ATRIAL FIBRILLATION (ICD-427.31) Assessment: Comment Only rate better no dizziness  on aspirin  His updated medication list for this problem includes:    Diltiazem Hcl Cr 120 Mg Xr12h-cap (Diltiazem hcl) .Marland Kitchen... 1 tab by mouth daily    Aspirin 325 Mg Tabs (Aspirin) .Marland Kitchen... 1 daily  Problem # 5:  BENIGN PROSTATIC HYPERTROPHY (ICD-600.00) Assessment: Comment Only voiding okay  Complete Medication List: 1)  Diltiazem Hcl Cr 120 Mg Xr12h-cap (Diltiazem hcl) .Marland Kitchen.. 1 tab by mouth daily 2)  Aspirin 325 Mg Tabs (Aspirin) .Marland Kitchen.. 1 daily 3)  Hydrochlorothiazide 25 Mg Tabs (Hydrochlorothiazide) .Marland Kitchen.. 1 daily 4)  Crestor 5 Mg Tabs (Rosuvastatin calcium) .Marland Kitchen.. 1 every other day 5)  Lisinopril 20 Mg Tabs (Lisinopril) .Marland Kitchen.. 1 daily 6)  Chlorzoxazone 500 Mg Tabs (Chlorzoxazone) .Marland Kitchen.. 1 tab by mouth three times a day as needed for muscle spasm 7)  Onetouch Ultra Test Strp (Glucose blood) .... Use as directed, dx: 250.00 patient tests  once daily. 8)  Onetouch Ultra Control Soln (Blood glucose calibration) .... Use as directed 9)  Onetouch Ultrasoft Lancets Misc (Lancets) .... Use as directed dx: 250.00 patient tests once daily 10)  Multivitamins Caps (Multiple vitamin) .Marland Kitchen.. 1 daily  Patient Instructions: 1)  Please schedule a follow-up appointment in 4 months .  Prescriptions: LISINOPRIL 20 MG TABS (LISINOPRIL) 1 daily  #90 x 3   Entered and Authorized by:   Cindee Salt MD   Signed by:   Cindee Salt MD on 08/07/2009   Method used:   Print then Give to Patient   RxID:   [id number] CRESTOR 5 MG TABS (ROSUVASTATIN CALCIUM) 1 every other day  #45 x 3   Entered and Authorized by:   Cindee Salt MD   Signed by:   Cindee Salt MD on 08/07/2009   Method used:   Print then Give to Patient  RxID:   [id number] HYDROCHLOROTHIAZIDE 25 MG TABS (HYDROCHLOROTHIAZIDE) 1 daily  #90 x 3   Entered and Authorized by:   Cindee Salt MD   Signed by:   Cindee Salt MD on 08/07/2009   Method used:   Print then Give to Patient   RxID:   [id number] DILTIAZEM HCL CR 120 MG XR12H-CAP (DILTIAZEM HCL) 1 tab by mouth daily  #90 x 3   Entered and Authorized by:   Cindee Salt MD   Signed by:   Cindee Salt MD on 08/07/2009   Method used:   Print then Give to Patient   RxID:   [id number]   Current Allergies (reviewed today): No known allergies

## 2010-04-13 NOTE — Assessment & Plan Note (Signed)
Summary: 4 MONTH FOLLOW UP/[facility]   Vital Signs:  Patient profile:   77 year old male Weight:      139 pounds Temp:     97.8 degrees F oral Pulse rate:   68 / minute Pulse rhythm:   regular BP sitting:   150 / 80  (left arm) Cuff size:   large  Vitals Entered By: Mervin Hack CMA Duncan Dull) (December 14, 2009 10:41 AM) CC: 4 month follow-up   History of Present Illness: has noticed some increased BP at times Gets over 140 but not specific about the numbers Clearly higher on lower diltiazem  Occ gets slight pains in chest as well Usually occurs at rest when sitting around in evening Not bad but is "new" No diaphoresis, SOB or nausea Present for a few minutes then just goes away  No palpitations No dizziness or syncope Still gets lightheadedness --like when he just starts off walking  No edema  Still checks sugars fine as usual--  under 130   Still with slow voids Noctuira x 1-2 --no change no pain  Allergies: No Known Drug Allergies  Past History:  Past medical, surgical, family and social histories (including risk factors) reviewed for relevance to current acute and chronic problems.  Past Medical History: Reviewed history from 08/07/2009 and no changes required. Atrial fibrillation--1980's--------------------------------------------Dr Wall Coronary artery disease--1995 Hyperlipidemia--1990's Hypertension--1990's Benign prostatic hypertrophy------------------------------------Dr Cope Lymphoma(?)--2002 NIDDM with neuropathy----------------------------------------Dr [facility] (podiatry) Osteoarthritis  Past Surgical History: Reviewed history from 08/07/2009 and no changes required. 1931 T&A 1949 LIH 1982--Cholecystectomy 1980's--Cranial hemorrhage while on coumadin 1986--melanoma (L shoulder) 1999--several nevi removed 2002--lumps on head (cancer?) 2011 Cataracts OU  Family History: Reviewed history from 07/28/2006 and no changes required. Dad died @91  old  age, had prostate cancer Mom died@65   CHF?, DM, HTN 1 sister with arthritis No history of colon cancer  Social History: Reviewed history from 12/31/2008 and no changes required. Producer, television/film/video Married--3 sons Mila Merry, [place], [place]) Former Smoker--quit 1982, 20 pack year history Alcohol use-yes Enjoys golf and bowling Regular Exercise - yes Drug Use - no  Review of Systems       No sig arthritis issues appetite is okay weight stable generally sleeps okay  Physical Exam  General:  alert and normal appearance.   Neck:  supple, no masses, no thyromegaly, no carotid bruits, and no cervical lymphadenopathy.   Lungs:  normal respiratory effort, no intercostal retractions, no accessory muscle use, and normal breath sounds.   Heart:  normal rate, no murmur, no gallop, and irregular rhythm.  HR  ~72 Msk:  no joint tenderness.   slight lump along medial side of left knee--posteriorly (?muscular) Pulses:  absent on left, 1+ on right Extremities:  no edema Skin:  no suspicious lesions and no ulcerations.   Psych:  normally interactive, good eye contact, not anxious appearing, and not depressed appearing.    Diabetes Management Exam:    Foot Exam (with socks and/or shoes not present):       Sensory-Pinprick/Light touch:          Left medial foot (L-4): normal          Left dorsal foot (L-5): normal          Left lateral foot (S-1): normal          Right medial foot (L-4): normal          Right dorsal foot (L-5): normal          Right lateral foot (  S-1): normal       Inspection:          Left foot: abnormal             Comments: dry flaking skin along plantar surface          Right foot: abnormal             Comments: dry flaking skin along plantar surface       Nails:          Left foot: fungal infection          Right foot: fungal infection   Impression & Recommendations:  Problem # 1:  ATRIAL FIBRILLATION (ICD-427.31) Assessment Unchanged rate  normal now on asa due to past bleed  His updated medication list for this problem includes:    Diltiazem Hcl 30 Mg Tabs (Diltiazem hcl) .Marland Kitchen... Take 1 tablet by mouth two times a day    Aspirin 325 Mg Tabs (Aspirin) .Marland Kitchen... 1 daily  Problem # 2:  CORONARY ARTERY DISEASE (ICD-414.00) Assessment: Unchanged vague chest symptoms since decreased diltiazem---doesn't seem ischemic continue current meds  His updated medication list for this problem includes:    Diltiazem Hcl 30 Mg Tabs (Diltiazem hcl) .Marland Kitchen... Take 1 tablet by mouth two times a day    Aspirin 325 Mg Tabs (Aspirin) .Marland Kitchen... 1 daily    Hydrochlorothiazide 25 Mg Tabs (Hydrochlorothiazide) .Marland Kitchen... 1 daily    Lisinopril 20 Mg Tabs (Lisinopril) .Marland Kitchen... 1 daily  Problem # 3:  OSTEOARTHRITIS (ICD-715.90) Assessment: Unchanged notes weakening in knees discussed exercise program limited by care his wife needs  Problem # 4:  DIABETES MELLITUS, TYPE II, WITH NEUROLOGICAL COMPLICATIONS (ICD-250.60) Assessment: Unchanged normoglycemic without meds no changes needed  His updated medication list for this problem includes:    Aspirin 325 Mg Tabs (Aspirin) .Marland Kitchen... 1 daily    Lisinopril 20 Mg Tabs (Lisinopril) .Marland Kitchen... 1 daily  Labs Reviewed: Creat: 0.7 (01/16/2009)     Last Eye Exam: No diabetic retinopathy.   Cataracts noted and referral made (04/26/2009) Reviewed HgBA1c results: 5.8 (08/07/2009)  5.9 (01/16/2009)  Complete Medication List: 1)  Diltiazem Hcl 30 Mg Tabs (Diltiazem hcl) .... Take 1 tablet by mouth two times a day 2)  Aspirin 325 Mg Tabs (Aspirin) .Marland Kitchen.. 1 daily 3)  Hydrochlorothiazide 25 Mg Tabs (Hydrochlorothiazide) .Marland Kitchen.. 1 daily 4)  Crestor 5 Mg Tabs (Rosuvastatin calcium) .Marland Kitchen.. 1 every other day 5)  Lisinopril 20 Mg Tabs (Lisinopril) .Marland Kitchen.. 1 daily 6)  Chlorzoxazone 500 Mg Tabs (Chlorzoxazone) .Marland Kitchen.. 1 tab by mouth three times a day as needed for muscle spasm 7)  Onetouch Ultra Test Strp (Glucose blood) .... Use as directed, dx: 250.00  patient tests once daily. 8)  Onetouch Ultra Control Soln (Blood glucose calibration) .... Use as directed 9)  Onetouch Ultrasoft Lancets Misc (Lancets) .... Use as directed dx: 250.00 patient tests once daily 10)  Multivitamins Caps (Multiple vitamin) .Marland Kitchen.. 1 daily  Patient Instructions: 1)  Please schedule a follow-up appointment in 4 months .   Current Allergies (reviewed today): No known allergies

## 2010-04-13 NOTE — Progress Notes (Signed)
Summary: Pulled muscle  Phone Note Call from Patient   Caller: Patient Call For: Jimmy Salt MD Summary of Call: Patient states he has a pulled muscle in this back, hip area on the right side.  Had this for a couple of weeks,  now it's getting worse where he can barely move at times.  He hasn't taken any pain medication or used any heating pad etc.  Wants to be seen either today or tomorrow if possible.  Spoke with [facility] and she told me to put him in tomorrow 03/30 at 12:30.  Patient advised. Initial call taken by: Linde Gillis CMA Duncan Dull),  June 09, 2009 11:44 AM

## 2010-04-15 NOTE — Assessment & Plan Note (Signed)
Summary: 4 m f/u dlo   Vital Signs:  Patient profile:   77 year old male Weight:      137 pounds Temp:     97.9 degrees F oral Pulse rate:   71 / minute Pulse rhythm:   regular BP sitting:   142 / 88  (left arm) Cuff size:   large  Vitals Entered By: Mervin Hack CMA Duncan Dull) (March 31, 2010 10:55 AM) CC: 4 month follow-up   History of Present Illness: Has had sig stress Wife fell, got intracerebral bleed. Was briefly at [facility] and then health care Now back home for about 1 week seems to be settling back into routine  Still checks sugars daily Generally under 140 No sores or pain in feet Had eye exam  Checks BP  Has been slightly higher of late due to less exercise when wife ill Now back to [facility] center [facility] doesn't remember the numbers  No chest pain No SOB No edema  Voids okay stable nocturia  Still with knee pain swelling there is some better (left)  Allergies: No Known Drug Allergies  Past History:  Past medical, surgical, family and social histories (including risk factors) reviewed for relevance to current acute and chronic problems.  Past Medical History: Reviewed history from 08/07/2009 and no changes required. 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 1931 T&A 1949 LIH 1982--Cholecystectomy 1980's--Cranial hemorrhage while on coumadin 1986--melanoma (L shoulder) 1999--several nevi removed 2002--lumps on head (cancer?) 2011 Cataracts OU  Family History: Reviewed history from 07/28/2006 and no changes required. Dad died @91  old age, had prostate  cancer Mom died@65   CHF?, DM, HTN 1 sister with arthritis No history of colon cancer  Social History: Reviewed history from 12/31/2008 and no changes required. Producer, television/film/video Married--3 sons Mila Merry, [place], [place]) Former Smoker--quit 1982, 20 pack year history Alcohol use-yes Enjoys golf and bowling Regular Exercise - yes Drug Use - no  Review of Systems       weight fairly stable despite the stress Now getting back to a more normal sleep schedule---wife's schedule had been reversed and was keeping him up at night Occ AM dizziness  Physical Exam  General:  alert and normal appearance.   Neck:  supple, no masses, no thyromegaly, no carotid bruits, and no cervical lymphadenopathy.   Lungs:  normal respiratory effort, no intercostal retractions, no accessory muscle use, and normal breath sounds.   Heart:  normal rate, regular rhythm, no murmur, and no gallop.   Msk:  no joint tenderness and no joint swelling.   Points to medial hamstring insertion as swelling point Pulses:  normal on right, faint in left foot Extremities:  no edema Skin:  no suspicious lesions and no ulcerations.   Psych:  normally interactive, good eye contact, not anxious appearing, and not depressed appearing.    Diabetes Management Exam:    Foot Exam (with socks and/or shoes not present):       Sensory-Pinprick/Light touch:          Left medial foot (L-4): normal          Left dorsal foot (L-5): normal          Left lateral foot (S-1): normal          Right medial foot (L-4): normal          Right  dorsal foot (L-5): normal          Right lateral foot (S-1): normal       Inspection:          Left foot: abnormal             Comments: scaling, slight redness on plantar surface          Right foot: abnormal             Comments: scaling, slight redness on plantar surface       Nails:          Left foot: fungal infection          Right foot: fungal infection   Impression &  Recommendations:  Problem # 1:  DIABETES MELLITUS, TYPE II, WITH NEUROLOGICAL COMPLICATIONS (ICD-250.60) Assessment Unchanged  seems to still have good contol will check labs  His updated medication list for this problem includes:    Lisinopril 20 Mg Tabs (Lisinopril) .Marland Kitchen... 1 daily    Aspirin 325 Mg Tabs (Aspirin) .Marland Kitchen... 1 daily  Labs Reviewed: Creat: 0.7 (01/16/2009)     Last Eye Exam: No diabetic retinopathy.   Cataracts noted and referral made (04/26/2009) Reviewed HgBA1c results: 5.8 (08/07/2009)  5.9 (01/16/2009)  Orders: TLB-A1C / Hgb A1C (Glycohemoglobin) (83036-A1C)  Problem # 2:  HYPERTENSION (ICD-401.9) Assessment: Unchanged  BP still okay will check labs  His updated medication list for this problem includes:    Diltiazem Hcl 30 Mg Tabs (Diltiazem hcl) .Marland Kitchen... Take 1 tablet by mouth two times a day    Hydrochlorothiazide 25 Mg Tabs (Hydrochlorothiazide) .Marland Kitchen... 1 daily    Lisinopril 20 Mg Tabs (Lisinopril) .Marland Kitchen... 1 daily  BP today: 142/88 Prior BP: 150/80 (12/14/2009)  Prior 10 Yr Risk Heart Disease: N/A (08/03/2006)  Labs Reviewed: K+: 4.1 (01/16/2009) Creat: : 0.7 (01/16/2009)   Chol: 139 (01/16/2009)   HDL: 50.80 (01/16/2009)   LDL: 74 (01/16/2009)   TG: 73.0 (01/16/2009)  Orders: TLB-Renal Function Panel (80069-RENAL) TLB-CBC Platelet - w/Differential (85025-CBCD) TLB-TSH (Thyroid Stimulating Hormone) (84443-TSH) Venipuncture (16109)  Problem # 3:  ATRIAL FIBRILLATION (ICD-427.31) rate normal now on asa due to past bleed  His updated medication list for this problem includes:    Diltiazem Hcl 30 Mg Tabs (Diltiazem hcl) .Marland Kitchen... Take 1 tablet by mouth two times a day    Aspirin 325 Mg Tabs (Aspirin) .Marland Kitchen... 1 daily  Problem # 4:  HYPERLIPIDEMIA (ICD-272.4) Assessment: Unchanged  doing okay due for labs  His updated medication list for this problem includes:    Crestor 5 Mg Tabs (Rosuvastatin calcium) .Marland Kitchen... 1 every other day  Labs Reviewed: SGOT:  31 (01/16/2009)   SGPT: 23 (01/16/2009)  Prior 10 Yr Risk Heart Disease: N/A (08/03/2006)   HDL:50.80 (01/16/2009), 44.7 (04/11/2008)  LDL:74 (01/16/2009), 69 (04/11/2008)  Chol:139 (01/16/2009), 128 (04/11/2008)  Trig:73.0 (01/16/2009), 71 (04/11/2008)  Orders: TLB-Lipid Panel (80061-LIPID) TLB-Hepatic/Liver Function Pnl (80076-HEPATIC)  Problem # 5:  OSTEOARTHRITIS (ICD-715.90) Assessment: Unchanged reasonably stable able to walk okay  Complete Medication List: 1)  Diltiazem Hcl 30 Mg Tabs (Diltiazem hcl) .... Take 1 tablet by mouth two times a day 2)  Hydrochlorothiazide 25 Mg Tabs (Hydrochlorothiazide) .Marland Kitchen.. 1 daily 3)  Crestor 5 Mg Tabs (Rosuvastatin calcium) .Marland Kitchen.. 1 every other day 4)  Lisinopril 20 Mg Tabs (Lisinopril) .Marland Kitchen.. 1 daily 5)  Chlorzoxazone 500 Mg Tabs (Chlorzoxazone) .Marland Kitchen.. 1 tab by mouth three times a day as needed for muscle spasm 6)  Onetouch Ultra Test Strp (Glucose blood) .Marland KitchenMarland KitchenMarland Kitchen  Use as directed, dx: 250.00 patient tests once daily. 7)  Onetouch Ultrasoft Lancets Misc (Lancets) .... Use as directed dx: 250.00 patient tests once daily 8)  Multivitamins Caps (Multiple vitamin) .Marland Kitchen.. 1 daily 9)  Aspirin 325 Mg Tabs (Aspirin) .Marland Kitchen.. 1 daily  Patient Instructions: 1)  Please schedule a follow-up appointment in 4 months .    Orders Added: 1)  Est. Patient Level IV [99214] 2)  TLB-A1C / Hgb A1C (Glycohemoglobin) [83036-A1C] 3)  TLB-Renal Function Panel [80069-RENAL] 4)  TLB-CBC Platelet - w/Differential [85025-CBCD] 5)  TLB-TSH (Thyroid Stimulating Hormone) [84443-TSH] 6)  Venipuncture [36415] 7)  TLB-Lipid Panel [80061-LIPID] 8)  TLB-Hepatic/Liver Function Pnl [80076-HEPATIC]    Current Allergies (reviewed today): No known allergies

## 2010-04-29 NOTE — Letter (Signed)
Summary: [facility]  [facility]   Imported By: Kassie Mends 04/19/2010 11:03:55  _____________________________________________________________________  External Attachment:    Type:   Image     Comment:   External Document  Appended Document: [facility]    Clinical Lists Changes  Observations: Added new observation of DIAB EYE EX: No diabetic retinopathy.    (03/22/2010 15:32)       Diabetic Eye Exam  Procedure date:  03/22/2010  Findings:      No diabetic retinopathy.

## 2010-05-10 ENCOUNTER — Encounter: Payer: Self-pay | Admitting: Cardiovascular Disease

## 2010-05-10 ENCOUNTER — Ambulatory Visit (INDEPENDENT_AMBULATORY_CARE_PROVIDER_SITE_OTHER): Payer: Medicare Other | Admitting: Cardiovascular Disease

## 2010-05-10 DIAGNOSIS — I10 Essential (primary) hypertension: Secondary | ICD-10-CM

## 2010-05-10 DIAGNOSIS — E785 Hyperlipidemia, unspecified: Secondary | ICD-10-CM

## 2010-05-10 DIAGNOSIS — I4891 Unspecified atrial fibrillation: Secondary | ICD-10-CM

## 2010-05-10 DIAGNOSIS — R42 Dizziness and giddiness: Secondary | ICD-10-CM | POA: Insufficient documentation

## 2010-05-20 NOTE — Assessment & Plan Note (Signed)
Summary: F/U 6 MONTHS/SAB   Visit Type:  Follow-up Referring Provider:  WALL Primary Provider:  Cindee Salt MD  CC:  c/o numbness & tingling in left arm.Marland Kitchen  History of Present Illness: Jimmy Riggs is a patient of Dr. Alphonsus Sias with history of chronic atrial fibrillation, not on Coumadin due to a remote history of fall with subdural hematoma, hypertension, hyperlipidemia, nonobstructive coronary artery disease and a negative stress test in December 2007 with ejection fraction 65% who presents for routine followup.   On his last clinic visit, we decreased his diltiazem to 30 mg b.i.d. He was bradycardic, had symptoms of dizziness with standing. Today he reports that he has not noticed much of a change in his dizziness. Though when pushed about details, he states he is unsteady when he gets up too quickly and that his legs are sometimes weak. He has not had any episodes of falling.  We did discuss his atrial fibrillation with him and he does not want to be on anticoagulation  He denies any chest pain, syncope, diaphoresis. No significant lower extremity edema. He's been taking his medications without any difficulty.  EKG shows atrial fibrillation with ventricular rate 68 beats per minute, intraventricular conduction delay, left anterior fascicular block  Current Medications (verified): 1)  Diltiazem Hcl 30 Mg Tabs (Diltiazem Hcl) .... Take 1 Tablet By Mouth Two Times A Day 2)  Hydrochlorothiazide 25 Mg Tabs (Hydrochlorothiazide) .Marland Kitchen.. 1 Daily 3)  Crestor 5 Mg Tabs (Rosuvastatin Calcium) .Marland Kitchen.. 1 Every Other Day 4)  Lisinopril 20 Mg Tabs (Lisinopril) .Marland Kitchen.. 1 Daily 5)  Chlorzoxazone 500 Mg Tabs (Chlorzoxazone) .Marland Kitchen.. 1 Tab By Mouth Three Times A Day As Needed For Muscle Spasm 6)  Onetouch Ultra Test  Strp (Glucose Blood) .... Use As Directed, Dx: 250.00 Patient Tests Once Daily. 7)  Onetouch Ultrasoft Lancets  Misc (Lancets) .... Use As Directed Dx: 250.00 Patient Tests Once Daily 8)   Multivitamins  Caps (Multiple Vitamin) .Marland Kitchen.. 1 Daily 9)  Aspirin 325 Mg Tabs (Aspirin) .Marland Kitchen.. 1 Daily  Allergies (verified): No Known Drug Allergies  Past History:  Past Medical History: Last updated: 08/07/2009 Atrial fibrillation--1980's--------------------------------------------Dr Wall Coronary artery disease--1995 Hyperlipidemia--1990's Hypertension--1990's Benign prostatic hypertrophy------------------------------------Dr Cope Lymphoma(?)--2002 NIDDM with neuropathy----------------------------------------Dr [facility] (podiatry) Osteoarthritis  Past Surgical History: Last updated: 08/07/2009 1931 T&A 1949 LIH 1982--Cholecystectomy 1980's--Cranial hemorrhage while on coumadin 1986--melanoma (L shoulder) 1999--several nevi removed 2002--lumps on head (cancer?) 2011 Cataracts OU  Family History: Last updated: Aug 10, 2006 Dad died @91  old age, had prostate cancer Mom died@65   CHF?, DM, HTN 1 sister with arthritis No history of colon cancer  Social History: Last updated: 12/31/2008 Retired--electrical engineer Married--3 sons Jerline Pain [place], [place], [place]) Former Smoker--quit 1982, 20 pack year history Alcohol use-yes Enjoys golf and bowling Regular Exercise - yes Drug Use - no  Risk Factors: Exercise: yes (12/31/2008)  Risk Factors: Smoking Status: quit (08/10/2006)  Review of Systems  The patient denies fever, weight loss, weight gain, vision loss, decreased hearing, hoarseness, chest pain, syncope, dyspnea on exertion, peripheral edema, prolonged cough, abdominal pain, incontinence, muscle weakness, depression, and enlarged lymph nodes.         dizziness with standing, weak legs  Vital Signs:  Patient profile:   77 year old male Height:      66 inches Weight:      137 pounds BMI:     22.19 Pulse rate:   76 / minute BP sitting:   161 / 90  (left arm) Cuff size:   regular  Vitals Entered By: Bishop Dublin, CMA (May 10, 2010 10:17 AM)  Physical  Exam  General:  Well developed, well nourished, in no acute distress. Head:  normocephalic and atraumatic Neck:  Neck supple, no JVD. No masses, thyromegaly or abnormal cervical nodes. Lungs:  Clear bilaterally to auscultation and percussion. Heart:  Non-displaced PMI, chest non-tender; regular rate and rhythm, S1, S2 without murmurs, rubs or gallops. Carotid upstroke normal, no bruit.  Pedals normal pulses. No edema, no varicosities. Abdomen:  Bowel sounds positive; abdomen soft and non-tender without masses Msk:  Back normal, normal gait. Muscle strength and tone normal. Pulses:  pulses normal in all 4 extremities Extremities:  No clubbing or cyanosis. Neurologic:  Alert and oriented x 3. Skin:  Intact without lesions or rashes. Psych:  Normal affect.   Impression & Recommendations:  Problem # 1:  CORONARY ARTERY DISEASE (ICD-414.00) mild coronary artery disease by previous studies. No recent chest pain. No further workup ordered.  His updated medication list for this problem includes:    Diltiazem Hcl 30 Mg Tabs (Diltiazem hcl) .Marland Kitchen... Take 1 tablet by mouth two times a day    Lisinopril 20 Mg Tabs (Lisinopril) .Marland Kitchen... 1 daily    Aspirin 325 Mg Tabs (Aspirin) .Marland Kitchen... 1 daily  Problem # 2:  ATRIAL FIBRILLATION (ICD-427.31) Chronic atrial fibrillation. Rate is well-controlled on lower dose diltiazem Continue diltiazem two times a day. he does not want anticoagulation after long discussion of risks and benefits.  His updated medication list for this problem includes:    Aspirin 325 Mg Tabs (Aspirin) .Marland Kitchen... 1 daily  Orders: EKG w/ Interpretation (93000)  Problem # 3:  HYPERLIPIDEMIA (ICD-272.4) Cholesterol is at goal with LDL close to 70.  His updated medication list for this problem includes:    Crestor 5 Mg Tabs (Rosuvastatin calcium) .Marland Kitchen... 1 every other day  Problem # 4:  DIZZINESS (ICD-780.4) etiology of his dizziness is uncertain. His blood pressure is not low, he has had no  improvement with significant reduction of his medication. I suspect some of his "dizziness "is some unsteadiness in his legs. Details of his dizziness is sparing and he does not have very good specifics.  Problem # 5:  HYPERTENSION (ICD-401.9) initial blood pressure was elevated though recheck of his blood pressure was 140/85. No other medication changes made at this time.  His updated medication list for this problem includes:    Diltiazem Hcl 30 Mg Tabs (Diltiazem hcl) .Marland Kitchen... Take 1 tablet by mouth two times a day    Hydrochlorothiazide 25 Mg Tabs (Hydrochlorothiazide) .Marland Kitchen... 1 daily    Lisinopril 20 Mg Tabs (Lisinopril) .Marland Kitchen... 1 daily    Aspirin 325 Mg Tabs (Aspirin) .Marland Kitchen... 1 daily

## 2010-07-27 NOTE — Assessment & Plan Note (Signed)
Jimmy Riggs, Jimmy Riggs                         MRN:          [id number]  DATE:05/06/2008                            DOB:          02/10/1925    Jimmy Riggs comes in today for followup.  He is totally asymptomatic.  He  takes remarkably good care of himself, walks on a daily basis.  He is  having no symptoms of angina, ischemic equivalents, tachy palpitations,  presyncope, or syncope.   PROBLEM LIST:  1. Chronic atrial fibrillation.  Rate control and aspirin.  He has had      a previous subdural hematoma years ago in [place] on Coumadin.  2. Nonobstructive coronary artery disease.  Negative stress Myoview      December 2007, EF 65% with a reasonable heart rate response and      control with exercise.  3. Hypertension.  4. Hyperlipidemia.   CURRENT MEDICATIONS:  1. Lisinopril 20 mg per day.  2. Hydrochlorothiazide 25 mg per day.  3. Cartia XT 240 mg a day.  4. Crestor 5 mg a day.  5. Aspirin 325 mg a day.   Recent blood work showed a normal liver panel, BMET, TSH, hemoglobin A1c  borderline high at 6.1%, total cholesterol 128, triglycerides 71, HDL  44.7, LDL 69.   PHYSICAL EXAMINATION:  VITAL SIGNS:  His blood pressure is 140/80, his  pulse 56 and atrial fib.  EKG shows left axis deviation, stable from his  previous ECG.  His weight is 156, down 5.  HEENT:  Normal.  NECK:  Supple.  Carotid upstrokes are equal bilaterally without bruits.  No JVD.  Thyroid is not enlarged.  Trachea is midline.  LUNGS:  Clear to  auscultation and percussion.  HEART:  Irregular rate and rhythm.  No gallop.  ABDOMEN:  Soft, good bowel sounds.  No midline bruit.  No hepatomegaly.  EXTREMITIES:  No cyanosis, clubbing, or edema.  Pulses are present.  NEURO:  Intact.   ASSESSMENT AND PLAN:  Jimmy Riggs is doing well.  I have made no changes  in his medical program.  We will see him back in a year.     Thomas C. Daleen Squibb, MD, [facility]  Electronically Signed   TCW/MedQ  DD: 05/06/2008  DT: 05/07/2008  Job #: [id number]

## 2010-07-27 NOTE — Assessment & Plan Note (Signed)
[facility] OFFICE NOTE   KEMONTE, ULLMAN                         MRN:          [id number]  DATE:05/08/2007                            DOB:          01/20/1925    Mr. Bunton returns today.   PROBLEM LIST:  1. Chronic atrial fibrillation.  We are going with rate control and      treated with aspirin.  He had a previous subdural hematoma years      ago in [place] on Coumadin.  2. Nonobstructive coronary artery disease.  He had a negative stress      Myoview February 22, 2006.  Ejection fraction was 65%, heart rate      response and control was reasonable with exercise.  3. Hypertension.   He has no complaints today except occasional postural dizziness.  I told  he could expect some of this.   CURRENT MEDICATIONS:  1. Lisinopril 20 mg a day.  2. Hydrochlorothiazide 25 mg a day.  3. Cardia XT 240 mg a day.  4. Crestor 5 mg every other day.  5. Aspirin 325 mg a day.   His blood pressure 132/80; his pulse is 55 and sinus brady. EKG confirms  atrial fib with a left axis deviation.  He has ST-segment flattening and  T-wave inversion laterally.  His weight is 161. He is in no acute  distress.  HEENT:  Unchanged.  Carotids are full without bruits.  No  JVD.  Thyroid is not enlarged.  Trachea is midline.  LUNGS:  Clear.  HEART:  Reveals a slow rate and rhythm that is irregular.  Variable S1-  S2.  ABDOMEN: Soft, good bowel sounds.  No midline bruits.  EXTREMITIES:  reveal no edema.  Pulses are intact.  NEURO:  Exam is intact.   Mr. Braxton is doing well from our standpoint.  I have made no changes in  his program.  Will see him back again in a year.     Thomas C. Daleen Squibb, MD, [facility]  Electronically Signed    TCW/MedQ  DD: 05/09/2007  DT: 05/09/2007  Job #: [id number]

## 2010-07-30 NOTE — Assessment & Plan Note (Signed)
[facility] OFFICE NOTE   Jimmy Riggs                         MRN:          [id number]  DATE:02/01/2006                            DOB:          10/16/1924    I was asked by Dr. Tillman Abide to evaluate and establish cardiology care  with [name]. He just moved here several months ago from [place],  [place].  He has children here and actually lived in [place] for five years  prior to this.   HISTORY OF PRESENT ILLNESS:  Jimmy Riggs is a 77 year old married white male  who has a long cardiac history.  He has a history of atrial fibrillation  dating back to the 1980s.  He actually fell years ago and experienced a  subdural hematoma that had to be evacuated in [place].  He has been on  aspirin ever since.  His aspirin dose was decreased several years ago from  325 a day to 81 mg a day.   He also has a history of nonobstructive coronary artery disease since the  mid 90s. Apparently his last catheterization in January, 2002 showed a 50%  LAD, 30% RCA, normal left ventricular systolic function.  He is due a  Myoview and an echo per his previous cardiologist in [place].   He has no symptoms of ischemia or angina at present.  He is fairly active,  walking a mile or two a day.  He denies any presyncope, syncope, or tachy  palpitations.   His past medical history is also significant for hypercholesterolemia, on  Crestor, followed by Dr. Alphonsus Sias.  Type 2 diabetes, and hypertension.  He has  a history of lymphoma, BPH, melanoma of his shoulder, status post excision,  history of cholecystectomy, history of a TURP, and a history of cranial  hematoma and craniotomy.   CURRENT MEDICATIONS:  1. Lisinopril 20 mg a day.  2. HCTZ 25 mg a day.  3. Cartia XT 240 daily.  4. Aspirin 81 mg a day.  5. Crestor 5 mg every other day.   He has no known drug allergies.   He does not smoke.  He smoked and  quit in 1981.  He does not use any  recreational drugs.  He drinks alcohol, probably one glass of wine per week.  He does enjoy caffeinated beverages.   His family history is remarkable for premature coronary disease in his  mother.   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  He has  three children.  He is married.  He just moved here, as mentioned in the  HPI.   REVIEW OF SYSTEMS:  CONSTITUTIONAL:  No fever, weight loss, or other  constitutional symptoms.  HEENT:  No recent visual changes, hearing loss, or  changes.  No recent dental problems.  He does wears glasses.  NECK:  No  stiffness, no pain.  No history of masses.  No history of thyroid  enlargement or thyroid disease.  CHEST:  See HPI for cardiac.  Pulmonary-  wise, he  has no major dyspnea on exertion, cough, congestion, hemoptysis.  GASTROINTESTINAL:  No recent history of nausea, vomiting, dysphagia, reflux  symptoms, melena, hematochezia, hematemesis.  GENITOURINARY:  Other than  some frequency, is negative.  EXTREMITIES:  He has some chronic degenerative  joint disease but no major problems with myalgias or arthralgias.  SKIN:  He  is due a skin check for melanomatous screen soon.  He is very protective  with the sun.  ENDOCRINE:  Negative or unremarkable.   PHYSICAL EXAMINATION:  VITAL SIGNS:  His blood pressure is 130/79.  His  pulse is 52 and irregular.  EKG confirms atrial fib with a left anterior  fascicular block and nonspecific ST segment T wave changes.  He is 5 feet 8  and weighs 152 pounds.  HEENT:  Sclerae are clear.  Male balding pattern.  He wears glasses.  Facial  asymmetry is normal.  Dentition satisfactory.  PERRLA.  Extraocular  movements are intact.  Sclerae are clear.  NECK:  Carotids are equal bilaterally without bruits.  There is no JVD.  Thyroid is not enlarged.  His neck is supple, no tenderness.  There is no  lymphadenopathy.  LUNGS:  Clear to auscultation.  HEART:  A  nondisplaced PMI.  He has an irregular rate and rhythm without  murmur, rub or gallop.  ABDOMEN:  Soft with good bowel sounds.  There is no midline bruit.  There is  no hepatomegaly.  EXTREMITIES:  No clubbing, cyanosis or edema.  Pulses were present.  NEUROLOGIC:  Intact.  MUSCULOSKELETAL:  Some chronic degenerative changes, particularly in his  metaphalangeal joints in his hands.  LYMPH:  Negative.   ASSESSMENT:  1. Chronic atrial fibrillation with a slow ventricular rate.  He is      totally asymptomatic and doing well with his current medical therapy.      I do think he needs to be on a 325 mg aspirin per day since the data      more strongly supports stroke prevention with that dose versus 81.  He      agrees to change.  2. Hypertension:  Under relatively good control.  3. Nonobstructive coronary disease.  He is asymptomatic at present but      says he is due a stress nuclear study.  4. Hyperlipidemia.   PLAN:  1. A 2D echocardiogram to follow up on LV function, mitral valve function,      and right-sided pressures.  2. A stress nuclear study.  He prefers to an exercise treadmill.  We will      do this on Cartia XT to see about rate control.  3. Increase aspirin from 81 to 325.  4. If his tests are stable, will see him back in a year.     Jimmy C. Daleen Squibb, MD, [facility]  Electronically Signed    TCW/MedQ  DD: 02/01/2006  DT: 02/01/2006  Job #: [id number]   cc:   Karie Schwalbe, MD

## 2010-08-03 ENCOUNTER — Encounter: Payer: Self-pay | Admitting: Internal Medicine

## 2010-08-04 ENCOUNTER — Encounter: Payer: Self-pay | Admitting: Internal Medicine

## 2010-08-04 ENCOUNTER — Ambulatory Visit (INDEPENDENT_AMBULATORY_CARE_PROVIDER_SITE_OTHER): Payer: Medicare Other | Admitting: Internal Medicine

## 2010-08-04 VITALS — BP 157/90 | HR 71 | Temp 97.6°F | Ht 66.0 in | Wt 137.0 lb

## 2010-08-04 DIAGNOSIS — E785 Hyperlipidemia, unspecified: Secondary | ICD-10-CM

## 2010-08-04 DIAGNOSIS — E1149 Type 2 diabetes mellitus with other diabetic neurological complication: Secondary | ICD-10-CM

## 2010-08-04 DIAGNOSIS — I10 Essential (primary) hypertension: Secondary | ICD-10-CM

## 2010-08-04 DIAGNOSIS — N400 Enlarged prostate without lower urinary tract symptoms: Secondary | ICD-10-CM

## 2010-08-04 DIAGNOSIS — I4891 Unspecified atrial fibrillation: Secondary | ICD-10-CM

## 2010-08-04 DIAGNOSIS — I2510 Atherosclerotic heart disease of native coronary artery without angina pectoris: Secondary | ICD-10-CM

## 2010-08-04 NOTE — Progress Notes (Signed)
Subjective:    Patient ID: Jimmy Riggs, male    DOB: 15-Nov-1924, 77 y.o.   MRN: [id number]  HPI Has had anger issues dealing with wife in Memory Care He is freq told things and he forgets or claims he isn't informed He doesn't notice other memory problems----no troubles with bills, getting lost, etc  No heart problems No palpitaitons No chest pain  No SOB Doing regular walking---routine off some by needing to help with AM care with wife due to her agitation Checks BP daily--140/90 usually   Checks sugars daily Forgot record--often below 100 and almost never above 120  Voids okay  Still has some mild dizziness if he gets up too quickly Nothing that limits him  Current outpatient prescriptions:aspirin 325 MG EC tablet, Take 325 mg by mouth daily.  , Disp: , Rfl: ;  diltiazem (CARDIZEM CD) 120 MG 24 hr capsule, Take 1 by mouth twice daily, Disp: , Rfl: ;  diltiazem (CARDIZEM) 30 MG tablet, Take 30 mg by mouth 2 (two) times daily.  , Disp: , Rfl: ;  hydrochlorothiazide 25 MG tablet, Take 25 mg by mouth daily.  , Disp: , Rfl:  lisinopril (PRINIVIL,ZESTRIL) 20 MG tablet, Take 20 mg by mouth daily.  , Disp: , Rfl: ;  Multiple Vitamin (MULTIVITAMIN) tablet, Take 1 tablet by mouth daily.  , Disp: , Rfl: ;  rosuvastatin (CRESTOR) 5 MG tablet, Take 5 mg by mouth every other day.  , Disp: , Rfl: ;  DISCONTD: chlorzoxazone (PARAFON) 500 MG tablet, Take 500 mg by mouth 3 (three) times daily as needed.  , Disp: , Rfl:   Past Medical History  Diagnosis Date  . Atrial fibrillation   . Coronary artery disease   . Hypertension   . Hyperlipidemia   . Benign prostatic hypertrophy   . Lymphoma   . NIDDM (non-insulin dependent diabetes mellitus)   . Osteoarthritis   . Acute intra-cranial hemorrhage     w/ coumadin  . Melanoma in situ of shoulder   . S/P tonsillectomy and adenoidectomy 1931  . Inguinal hernia     Past Surgical History  Procedure Date  . Tonsillectomy and adenoidectomy   .  Cholecystectomy   . Cataract extraction     Family History  Problem Relation Age of Onset  . Prostate cancer Father   . Heart failure Mother   . Diabetes Mother   . Hypertension Mother   . Arthritis Sister     History   Social History  . Marital Status: Married    Spouse Name: N/A    Number of Children: 3  . Years of Education: N/A   Occupational History  . retired- Acupuncturist    Social History Main Topics  . Smoking status: Former Smoker -- 20 years    Quit date: 03/14/1980  . Smokeless tobacco: Never Used  . Alcohol Use: Yes  . Drug Use: No  . Sexually Active:    Other Topics Concern  . Not on file   Social History Narrative   Enjoys golf and bowling   Review of Systems Sleeps okay Appetite fine Weight fairly stable    Objective:   Physical Exam  Constitutional: He appears well-developed and well-nourished. No distress.  Neck: Normal range of motion. Neck supple.  Cardiovascular: Normal rate, normal heart sounds and intact distal pulses.  Exam reveals no gallop.   No murmur heard.      Irregular Faint but palpable pedal pulses  Pulmonary/Chest: Effort normal. No  respiratory distress. He has no wheezes. He has no rales.       Decreased breath sounds but clear  Abdominal: Soft. There is no tenderness.  Musculoskeletal: Normal range of motion. He exhibits no edema and no tenderness.  Lymphadenopathy:    He has no cervical adenopathy.  Skin:       Slight plantar erythema around nails and plantar feet No ulcers  Psychiatric: His behavior is normal. Judgment and thought content normal.       Quick anger Limited insight into his behavior          Assessment & Plan:

## 2010-09-03 ENCOUNTER — Other Ambulatory Visit: Payer: Self-pay | Admitting: Internal Medicine

## 2010-09-20 ENCOUNTER — Other Ambulatory Visit: Payer: Self-pay | Admitting: *Deleted

## 2010-09-20 MED ORDER — DILTIAZEM HCL 30 MG PO TABS: 30.0000 mg | ORAL_TABLET | Freq: Two times a day (BID) | ORAL | Status: DC

## 2010-09-20 MED ORDER — ROSUVASTATIN CALCIUM 5 MG PO TABS: 5.0000 mg | ORAL_TABLET | ORAL | Status: DC

## 2010-09-20 MED ORDER — LISINOPRIL 20 MG PO TABS: 20.0000 mg | ORAL_TABLET | Freq: Every day | ORAL | Status: DC

## 2010-09-20 MED ORDER — HYDROCHLOROTHIAZIDE 25 MG PO TABS: 25.0000 mg | ORAL_TABLET | Freq: Every day | ORAL | Status: DC

## 2010-09-20 NOTE — Telephone Encounter (Signed)
Pt is asking that a 90 day refill on diltiazem [facility].  He says he takes 30 mgs twice a day, which is in centricity, but diltiazem 120 mg's is on med list in epic.  Please advise on whether you increased his dose.

## 2010-09-20 NOTE — Telephone Encounter (Signed)
rx sent to pharmacy by e-script, 30mg  bid

## 2010-09-20 NOTE — Telephone Encounter (Signed)
As far as I know, he is still on 30mg  bid (on Dr Windell Hummingbird note also) Okay to refill x 1 year

## 2010-10-18 ENCOUNTER — Encounter: Payer: Self-pay | Admitting: Cardiovascular Disease

## 2010-10-18 ENCOUNTER — Ambulatory Visit (INDEPENDENT_AMBULATORY_CARE_PROVIDER_SITE_OTHER): Payer: Medicare Other | Admitting: Cardiovascular Disease

## 2010-10-18 DIAGNOSIS — I10 Essential (primary) hypertension: Secondary | ICD-10-CM

## 2010-10-18 DIAGNOSIS — R42 Dizziness and giddiness: Secondary | ICD-10-CM

## 2010-10-18 DIAGNOSIS — I2510 Atherosclerotic heart disease of native coronary artery without angina pectoris: Secondary | ICD-10-CM

## 2010-10-18 DIAGNOSIS — I4891 Unspecified atrial fibrillation: Secondary | ICD-10-CM

## 2010-10-18 DIAGNOSIS — E785 Hyperlipidemia, unspecified: Secondary | ICD-10-CM

## 2010-10-18 MED ORDER — DILTIAZEM HCL ER 120 MG PO CP12: 120.0000 mg | ORAL_CAPSULE | Freq: Every day | ORAL | Status: DC

## 2010-10-18 NOTE — Assessment & Plan Note (Signed)
Currently with no symptoms of angina. No further workup at this time. Continue current medication regimen. 

## 2010-10-18 NOTE — Assessment & Plan Note (Signed)
Adequate rate control on diltiazem and will increase the dose for blood pressure control.

## 2010-10-18 NOTE — Assessment & Plan Note (Signed)
Etiology of his dizziness is uncertain there does not seem to be related to his diltiazem, or blood pressure or even heart rate. He did not want [place] to change his other medications at this time as his symptoms are mild and he is able to keep going without any hesitation despite his symptoms.

## 2010-10-18 NOTE — Assessment & Plan Note (Signed)
Blood pressure is elevated in the office as well as at home. He did not have any improvement in his dizziness by decreasing his diltiazem. We will go back to his previous dose of diltiazem SR 120 mg daily.

## 2010-10-18 NOTE — Patient Instructions (Signed)
You are doing well. Please stop the diltiazem 30 mg twice a day Start the diltiazem 120 mg once a day Please call us if you have new issues that need to be addressed before your next appt.  We will call you for a follow up Appt. In 12 months

## 2010-10-18 NOTE — Progress Notes (Signed)
Patient ID: Jimmy Riggs, male    DOB: 20-Oct-1924, 77 y.o.   MRN: [id number]  HPI Comments: Jimmy Riggs is a patient of Dr. Alphonsus Sias with history of chronic atrial fibrillation, not on Coumadin due to a remote history of fall with subdural hematoma, hypertension, hyperlipidemia, nonobstructive coronary artery disease and a negative stress test in December 2007 with ejection fraction 65% who presents for routine followup.     He reports that his blood pressure has been elevated. His dizziness has not been approved on the decreased diltiazem dose. He reports systolic pressures in the 150s at home. The dizziness does not seem to bother him and he is able to walk a mile in the morning without stopping. Dizziness might be a little bit worse in the morning of the does have mild symptoms later in the day. It is not a spinning, it is a lightheadedness.    he does not want to be on anticoagulation for atrial fibrillation   He denies any chest pain, syncope, diaphoresis. No significant lower extremity edema. He's been taking his medications without any difficulty.   EKG shows atrial fibrillation with ventricular rate 72 beats per minute, Right bundle branch block,  left anterior fascicular block     Outpatient Encounter Prescriptions as of 10/18/2010  Medication Sig Dispense Refill  . aspirin 325 MG EC tablet Take 325 mg by mouth daily.        . hydrochlorothiazide 25 MG tablet Take 1 tablet (25 mg total) by mouth daily.  30 tablet  0  . Lancets (ONETOUCH ULTRASOFT) lancets TEST as directed once daily  100 each  11  . lisinopril (PRINIVIL,ZESTRIL) 20 MG tablet Take 1 tablet (20 mg total) by mouth daily.  90 tablet  3  . Multiple Vitamin (MULTIVITAMIN) tablet Take 1 tablet by mouth daily.        . ONE TOUCH ULTRA TEST test strip TEST as directed once daily  100 each  11  . rosuvastatin (CRESTOR) 5 MG tablet Take 1 tablet (5 mg total) by mouth every other day.  90 tablet  3  .  diltiazem (CARDIZEM) 30 MG  tablet Take 1 tablet (30 mg total) by mouth 2 (two) times daily.  180 tablet  3      Review of Systems  Constitutional: Negative.   HENT: Negative.   Eyes: Negative.   Respiratory: Negative.   Cardiovascular: Negative.   Gastrointestinal: Negative.   Musculoskeletal: Negative.   Skin: Negative.   Neurological: Positive for dizziness.  Hematological: Negative.   Psychiatric/Behavioral: Negative.   All other systems reviewed and are negative.    BP 143/99  Pulse 71  Ht 5\' 7"  (1.702 m)  Wt 137 lb (62.143 kg)  BMI 21.46 kg/m2  Physical Exam  Nursing note and vitals reviewed. Constitutional: He is oriented to person, place, and time. He appears well-developed and well-nourished.  HENT:  Head: Normocephalic.  Nose: Nose normal.  Mouth/Throat: Oropharynx is clear and moist.  Eyes: Conjunctivae are normal. Pupils are equal, round, and reactive to light.  Neck: Normal range of motion. Neck supple. No JVD present.  Cardiovascular: Normal rate, normal heart sounds and intact distal pulses.  An irregularly irregular rhythm present. Exam reveals no gallop and no friction rub.   No murmur heard. Pulmonary/Chest: Effort normal and breath sounds normal. No respiratory distress. He has no wheezes. He has no rales. He exhibits no tenderness.  Abdominal: Soft. Bowel sounds are normal. He exhibits no distension. There is  no tenderness.  Musculoskeletal: Normal range of motion. He exhibits no edema and no tenderness.  Lymphadenopathy:    He has no cervical adenopathy.  Neurological: He is alert and oriented to person, place, and time. Coordination normal.  Skin: Skin is warm and dry. No rash noted. No erythema.  Psychiatric: He has a normal mood and affect. His behavior is normal. Judgment and thought content normal.           Assessment and Plan

## 2010-10-18 NOTE — Assessment & Plan Note (Signed)
Cholesterol is at goal on the current lipid regimen. No changes to the medications were made.  

## 2010-12-08 ENCOUNTER — Ambulatory Visit (INDEPENDENT_AMBULATORY_CARE_PROVIDER_SITE_OTHER): Payer: Medicare Other | Admitting: Internal Medicine

## 2010-12-08 ENCOUNTER — Ambulatory Visit: Payer: Medicare Other | Admitting: Internal Medicine

## 2010-12-08 ENCOUNTER — Encounter: Payer: Self-pay | Admitting: Internal Medicine

## 2010-12-08 VITALS — BP 158/81 | HR 68 | Temp 97.6°F | Ht 67.0 in | Wt 135.0 lb

## 2010-12-08 DIAGNOSIS — Z23 Encounter for immunization: Secondary | ICD-10-CM

## 2010-12-08 DIAGNOSIS — E1149 Type 2 diabetes mellitus with other diabetic neurological complication: Secondary | ICD-10-CM

## 2010-12-08 DIAGNOSIS — I2510 Atherosclerotic heart disease of native coronary artery without angina pectoris: Secondary | ICD-10-CM

## 2010-12-08 DIAGNOSIS — I4891 Unspecified atrial fibrillation: Secondary | ICD-10-CM

## 2010-12-08 DIAGNOSIS — E785 Hyperlipidemia, unspecified: Secondary | ICD-10-CM

## 2010-12-08 DIAGNOSIS — I10 Essential (primary) hypertension: Secondary | ICD-10-CM

## 2010-12-08 LAB — HEPATIC FUNCTION PANEL
ALT: 14 U/L (ref 0–53)
Albumin: 4.5 g/dL (ref 3.5–5.2)
Total Bilirubin: 0.9 mg/dL (ref 0.3–1.2)

## 2010-12-08 LAB — CBC WITH DIFFERENTIAL/PLATELET
Eosinophils Absolute: 0 10*3/uL (ref 0.0–0.7)
Eosinophils Relative: 1.1 % (ref 0.0–5.0)
MCV: 97.5 fl (ref 78.0–100.0)
Monocytes Absolute: 0.7 10*3/uL (ref 0.1–1.0)
Neutrophils Relative %: 50.1 % (ref 43.0–77.0)
Platelets: 143 10*3/uL — ABNORMAL LOW (ref 150.0–400.0)
WBC: 4.2 10*3/uL — ABNORMAL LOW (ref 4.5–10.5)

## 2010-12-08 LAB — LIPID PANEL
Cholesterol: 138 mg/dL (ref 0–200)
HDL: 57.3 mg/dL (ref >39.00)
LDL Cholesterol: 74 mg/dL (ref 0–99)
Triglycerides: 35 mg/dL (ref 0.0–149.0)
VLDL: 7 mg/dL (ref 0.0–40.0)

## 2010-12-08 LAB — BASIC METABOLIC PANEL
BUN: 9 mg/dL (ref 6–23)
Chloride: 96 mEq/L (ref 96–112)
Creatinine, Ser: 0.6 mg/dL (ref 0.4–1.5)

## 2010-12-08 LAB — TSH: TSH: 2.66 u[IU]/mL (ref 0.35–5.50)

## 2010-12-08 NOTE — Assessment & Plan Note (Signed)
Due for labs on crestor

## 2010-12-08 NOTE — Assessment & Plan Note (Signed)
BP Readings from Last 3 Encounters:  12/08/10 158/81  10/18/10 143/99  08/04/10 157/90   Mild persistent elevations that may be safer at his age Mild vague lightheadedness---??orthostatic No changes

## 2010-12-08 NOTE — Assessment & Plan Note (Signed)
Good rate control On aspirin due to past bleeding

## 2010-12-08 NOTE — Assessment & Plan Note (Signed)
Seems to be quiet Stays active without angina

## 2010-12-08 NOTE — Assessment & Plan Note (Signed)
Still seems to have excellent control Will check labs No meds

## 2010-12-08 NOTE — Progress Notes (Signed)
Subjective:    Patient ID: Jimmy Riggs, male    DOB: 15-May-1924, 77 y.o.   MRN: [id number]  HPI Doing okay BP still a little high--but systolic rarely over 150 and diastolics mostly in 80's Still with lightheadedness at times--seems to be mostly upon getting up No syncope  Sugars are good Checks daily and most in 90's No hypoglycemic reactions Keeps up with eye exams  Seems to be adjusting to wife being in Memory Care He spends much of the day there with her Relatively satisfied with her care No sig anger issues now  Asks about zostavax Rx given  No chest pain No SOB Walks regularly without change in exercise tolerance No palpitations No edema  Got right hernia after lifting wife Goes back in No pain   Current Outpatient Prescriptions on File Prior to Visit  Medication Sig Dispense Refill  . aspirin 325 MG EC tablet Take 325 mg by mouth daily.        Marland Kitchen diltiazem (CARDIZEM SR) 120 MG 12 hr capsule Take 1 capsule (120 mg total) by mouth daily.  90 capsule  4  . hydrochlorothiazide 25 MG tablet Take 1 tablet (25 mg total) by mouth daily.  30 tablet  0  . Lancets (ONETOUCH ULTRASOFT) lancets TEST as directed once daily  100 each  11  . lisinopril (PRINIVIL,ZESTRIL) 20 MG tablet Take 1 tablet (20 mg total) by mouth daily.  90 tablet  3  . Multiple Vitamin (MULTIVITAMIN) tablet Take 1 tablet by mouth daily.        . ONE TOUCH ULTRA TEST test strip TEST as directed once daily  100 each  11  . rosuvastatin (CRESTOR) 5 MG tablet Take 1 tablet (5 mg total) by mouth every other day.  90 tablet  3    No Known Allergies  Past Medical History  Diagnosis Date  . Atrial fibrillation   . Coronary artery disease   . Hypertension   . Hyperlipidemia   . Benign prostatic hypertrophy   . Lymphoma   . NIDDM (non-insulin dependent diabetes mellitus)   . Osteoarthritis   . Acute intra-cranial hemorrhage     w/ coumadin  . Melanoma in situ of shoulder   . S/P tonsillectomy and  adenoidectomy 1931  . Inguinal hernia     Past Surgical History  Procedure Date  . Tonsillectomy and adenoidectomy   . Cholecystectomy   . Cataract extraction     Family History  Problem Relation Age of Onset  . Prostate cancer Father   . Heart failure Mother   . Diabetes Mother   . Hypertension Mother   . Arthritis Sister     History   Social History  . Marital Status: Married    Spouse Name: N/A    Number of Children: 3  . Years of Education: N/A   Occupational History  . retired- Acupuncturist    Social History Main Topics  . Smoking status: Former Smoker -- 20 years    Quit date: 03/14/1980  . Smokeless tobacco: Never Used  . Alcohol Use: Yes  . Drug Use: No  . Sexually Active:    Other Topics Concern  . Not on file   Social History Narrative   Enjoys golf and bowling   Review of Systems Appetite is fair but can't eat as much Weight has been drifting down some Sleeps okay Intermittent right side nose bleed    Objective:   Physical Exam  Constitutional: He appears well-developed  and well-nourished. No distress.  HENT:       Small irritated area at distal left nare---discussed using neosporin tid  Neck: Normal range of motion. Neck supple. No thyromegaly present.  Cardiovascular: Normal rate and normal heart sounds.  Exam reveals no gallop.   No murmur heard.      Irregular rhythm Faint pedal pulses  Pulmonary/Chest: Effort normal and breath sounds normal. No respiratory distress. He has no wheezes. He has no rales.  Genitourinary:       Small reducible right inguinal hernia  Musculoskeletal: He exhibits no edema and no tenderness.  Lymphadenopathy:    He has no cervical adenopathy.  Skin:       Mild redness and scaling on plantar feet No ulcers or sig callous  Psychiatric: He has a normal mood and affect. His behavior is normal. Judgment and thought content normal.          Assessment & Plan:

## 2011-04-04 DIAGNOSIS — H524 Presbyopia: Secondary | ICD-10-CM | POA: Diagnosis not present

## 2011-04-04 DIAGNOSIS — Z961 Presence of intraocular lens: Secondary | ICD-10-CM | POA: Diagnosis not present

## 2011-04-04 DIAGNOSIS — E119 Type 2 diabetes mellitus without complications: Secondary | ICD-10-CM | POA: Diagnosis not present

## 2011-04-18 DIAGNOSIS — M79609 Pain in unspecified limb: Secondary | ICD-10-CM | POA: Diagnosis not present

## 2011-04-18 DIAGNOSIS — B351 Tinea unguium: Secondary | ICD-10-CM | POA: Diagnosis not present

## 2011-05-02 ENCOUNTER — Telehealth: Payer: Self-pay | Admitting: Internal Medicine

## 2011-05-02 NOTE — Telephone Encounter (Signed)
Pt is calling to see if he can start seeing you over at [facility]? He is stating that you see his wife over at [facility] in Memory care. He said it would work out better for him to be seen at [facility] instead of coming out to [facility].

## 2011-05-02 NOTE — Telephone Encounter (Signed)
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 If he is really unable to make it out here, I can put him on my home visit list

## 2011-05-02 NOTE — Telephone Encounter (Signed)
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

## 2011-05-02 NOTE — Telephone Encounter (Signed)
Spoke with patient and he stated he spends a lot of time in memory care with his wife and he know's you will be there on Wednesday afternoon and he just need you to look at his rear, because he has a growth and doesn't know what it is. I advised that he would need to schedule an appt here at the office and pt got really upset, he only need you to look to see if he needs a OV? Please advise

## 2011-05-02 NOTE — Telephone Encounter (Signed)
Spoke with patient and advised results   

## 2011-05-02 NOTE — Telephone Encounter (Signed)
Voicemail box not set up, couldn't leave message. Found home number in chart, .left message to have patient return my call.

## 2011-05-25 DIAGNOSIS — L570 Actinic keratosis: Secondary | ICD-10-CM | POA: Diagnosis not present

## 2011-05-25 DIAGNOSIS — L819 Disorder of pigmentation, unspecified: Secondary | ICD-10-CM | POA: Diagnosis not present

## 2011-06-01 ENCOUNTER — Telehealth: Payer: Self-pay | Admitting: Internal Medicine

## 2011-06-01 NOTE — Telephone Encounter (Signed)
Appt has been rescheduled w/ Dr. Sharen Hones on tomorrow at 8:15am. Pt aware .Jimmy Riggs

## 2011-06-01 NOTE — Telephone Encounter (Signed)
Caller: Jimmy Riggs/Patient; PCP: Tillman Abide I.; CB#: [phone];  Call regarding Cuts, Scrapes, Abrasions; Bumped his R hand on some stairs with an abrasion on 05/30/11. Today he has redness and swelling to his hand. Pt is very upset that he has to answer this RN's questions making triage a bit difficult. He is assured his well being is the important factor. There are no appts today which angers him further as he may need to go to UC. RN spoke to [place] in the ofc who makes an appt for 06/12/11 @ 11:45. Hand Injury Protocol.

## 2011-06-01 NOTE — Telephone Encounter (Signed)
Should be with me tomorrow at 1:30PM

## 2011-06-01 NOTE — Telephone Encounter (Signed)
[facility] Triage Call Report Triage Record Num: [id number] Operator: Freddie Breech Patient Name: Jimmy Riggs Call Date & Time: 06/01/2011 1:20:24PM Patient Phone: [phone] PCP: Tillman Abide Patient Gender: Male PCP Fax : [phone] Patient DOB: June 29, 1924 Practice Name: [facility] Reason for Call: Caller: Jimmy Riggs/Patient; PCP: Tillman Abide I.; CB#: [phone]; ; ; Call regarding Cuts, Scrapes, Abrasions; Bumped his R hand on some stairs with an abrasion on 05/30/11. Today he has redness and swelling to his hand. Pt is very upset that he has to answer this RN's questions making triage a bit difficult. He is assured his well being is the important factor. There are no appts today which angers him further as he may need to go to UC. RN spoke to [place] in the ofc who makes an appt for 06/12/11 @ 11:45. Hand Injury Protocol. Protocol(s) Used: Hand Injury Recommended Outcome per Protocol: See Provider within 24 hours Reason for Outcome: New onset mild to moderate pain that has not improved with 24 hours of home care Care Advice: ~ 03/

## 2011-06-02 ENCOUNTER — Encounter: Payer: Self-pay | Admitting: Family Medicine

## 2011-06-02 ENCOUNTER — Ambulatory Visit: Payer: Medicare Other | Admitting: Family Medicine

## 2011-06-02 ENCOUNTER — Ambulatory Visit (INDEPENDENT_AMBULATORY_CARE_PROVIDER_SITE_OTHER): Payer: Medicare Other | Admitting: Family Medicine

## 2011-06-02 VITALS — BP 138/78 | HR 72 | Temp 97.7°F | Wt 149.0 lb

## 2011-06-02 DIAGNOSIS — Z23 Encounter for immunization: Secondary | ICD-10-CM

## 2011-06-02 DIAGNOSIS — L02519 Cutaneous abscess of unspecified hand: Secondary | ICD-10-CM | POA: Diagnosis not present

## 2011-06-02 DIAGNOSIS — L03113 Cellulitis of right upper limb: Secondary | ICD-10-CM | POA: Insufficient documentation

## 2011-06-02 MED ORDER — CEPHALEXIN 500 MG PO CAPS: 500.0000 mg | ORAL_CAPSULE | Freq: Three times a day (TID) | ORAL | Status: AC

## 2011-06-02 NOTE — Patient Instructions (Addendum)
Change dressing twice daily, keep triple antibiotic ointment on skin. Take keflex three times daily for 7 days. Update [place] if pus, spreading redness, or worsening fever/pain Return tomorrow afternoon for recheck.  Cellulitis Cellulitis is an infection of the skin and the tissue beneath it. The area is typically red and tender. It is caused by germs (bacteria) (usually staph or strep) that enter the body through cuts or sores. Cellulitis most commonly occurs in the arms or lower legs.  HOME CARE INSTRUCTIONS   If you are given a prescription for medications which kill germs (antibiotics), take as directed until finished.   If the infection is on the arm or leg, keep the limb elevated as able.   Use a warm cloth several times per day to relieve pain and encourage healing.   See your caregiver for recheck of the infected site as directed if problems arise.   Only take over-the-counter or prescription medicines for pain, discomfort, or fever as directed by your caregiver.  SEEK MEDICAL CARE IF:   The area of redness (inflammation) is spreading, there are red streaks coming from the infected site, or if a part of the infection begins to turn dark in color.   The joint or bone underneath the infected skin becomes painful after the skin has healed.   The infection returns in the same or another area after it seems to have gone away.   A boil or bump swells up. This may be an abscess.   New, unexplained problems such as pain or fever develop.  SEEK IMMEDIATE MEDICAL CARE IF:   You have a fever.   You or your child feels drowsy or lethargic.   There is vomiting, diarrhea, or lasting discomfort or feeling ill (malaise) with muscle aches and pains.  MAKE SURE YOU:   Understand these instructions.   Will watch your condition.   Will get help right away if you are not doing well or get worse.  Document Released: 12/08/2004 Document Revised: 02/17/2011 Document Reviewed:  10/17/2007 [facility] Patient Information 2012 [place], [place].

## 2011-06-02 NOTE — Progress Notes (Signed)
  Subjective:    Patient ID: Jimmy Riggs., male    DOB: April 29, 1924, 78 y.o.   MRN: [id number]  HPI CC: hand infection?  78 yo with h/o HTN, HLD, CAD, h/o lymphoma and melanoma in situ, diet controlled diabetes presents with right hand injury.  At beginning of week (Sunday, Monday), was coming up on stairwell and hit hand in corner.  Tore skin dorsally in 2 places. Has been using antibiotic ointment but may have gotten dirt in it.  Worried infected because red and sore.  Td 12/2005.  May have received at [facility] prior.  Review of Systems Per HPI    Objective:   Physical Exam  Nursing note and vitals reviewed. Constitutional: He appears well-developed and well-nourished. No distress.  Cardiovascular:  Pulses:      Radial pulses are 2+ on the right side, and 2+ on the left side.  Musculoskeletal:       Hands: Skin: Skin is warm and dry. There is erythema.       2 separate abrasions on dorsum of right hand with thin skin pulled back. Erythema warmth and swelling of dorsum of hand present. Slight streaking of erythema up forearm and warmth present. No bony tenderness. No joint tenderness, FROM at wrists and MCP/PIP/DIP. No pus or drainage.       Assessment & Plan:

## 2011-06-02 NOTE — Assessment & Plan Note (Addendum)
After injury that broke skin. Nonpurulent cellulitis - treat with keflex tid x7 days. Return tomorrow afternoon for recheck to ensure healing well.  Soaked hand in saline, then dressed wounds with abx ointment and nonadherent gauze bandage/kerlex. Last tetanus 2007 - tried to call [facility] to see if sooner Td, no answer so we gave Td today.

## 2011-06-03 ENCOUNTER — Encounter: Payer: Self-pay | Admitting: Family Medicine

## 2011-06-03 ENCOUNTER — Ambulatory Visit (INDEPENDENT_AMBULATORY_CARE_PROVIDER_SITE_OTHER): Payer: Medicare Other | Admitting: Family Medicine

## 2011-06-03 VITALS — BP 126/78 | HR 64 | Temp 98.3°F | Wt 149.0 lb

## 2011-06-03 DIAGNOSIS — L03113 Cellulitis of right upper limb: Secondary | ICD-10-CM

## 2011-06-03 DIAGNOSIS — L02519 Cutaneous abscess of unspecified hand: Secondary | ICD-10-CM

## 2011-06-03 DIAGNOSIS — L03119 Cellulitis of unspecified part of limb: Secondary | ICD-10-CM | POA: Diagnosis not present

## 2011-06-03 NOTE — Assessment & Plan Note (Signed)
Improving. Redress today. rtc 4d for recheck.

## 2011-06-03 NOTE — Patient Instructions (Signed)
Improving. Continue treatment as up to now - keflex and dressing change.  May space out to every 2 days.  With changes - wash hand with soap water, and blot dry. Don't rub. Return on Tuesday morning for recheck. Good to see you today, call us with questions.

## 2011-06-03 NOTE — Progress Notes (Signed)
  Subjective:    Patient ID: Jimmy Brick., male    DOB: 07/24/1924, 78 y.o.   MRN: [id number]  HPI CC: f/u hand  78 yo with h/o HTN, HLD, CAD, h/o lymphoma and melanoma in situ, diet controlled diabetes presents with right hand injury.  At beginning of week (Sunday, Monday), was coming up on stairwell and hit hand in corner. Tore skin dorsally in 2 places. Has been using antibiotic ointment but may have gotten dirt in it. Worried infected because red and sore.   Given Td last visit.  Dressed and started on keflex.  Swelling down, redness down as well.  No pain endorsed.  No drainage.  Keeping wrapped  Review of Systems Per HPI    Objective:   Physical Exam  Nursing note and vitals reviewed. Constitutional: He appears well-developed and well-nourished. No distress.  Cardiovascular:  Pulses:      Radial pulses are 2+ on the right side, and 2+ on the left side.  Musculoskeletal:       Hands:      Proximal abrasion closing well.  Skin: Skin is warm and dry.       2 separate abrasions on dorsum of right hand with thin skin pulled back. No bony tenderness. No joint tenderness, FROM at wrists and MCP/PIP/DIP. No pus or drainage.       Assessment & Plan:

## 2011-06-06 ENCOUNTER — Encounter: Payer: Self-pay | Admitting: Internal Medicine

## 2011-06-06 ENCOUNTER — Ambulatory Visit (INDEPENDENT_AMBULATORY_CARE_PROVIDER_SITE_OTHER): Payer: Medicare Other | Admitting: Internal Medicine

## 2011-06-06 VITALS — BP 150/98 | HR 80 | Temp 98.2°F | Wt 147.0 lb

## 2011-06-06 DIAGNOSIS — I4891 Unspecified atrial fibrillation: Secondary | ICD-10-CM

## 2011-06-06 DIAGNOSIS — L03113 Cellulitis of right upper limb: Secondary | ICD-10-CM

## 2011-06-06 DIAGNOSIS — I2510 Atherosclerotic heart disease of native coronary artery without angina pectoris: Secondary | ICD-10-CM | POA: Diagnosis not present

## 2011-06-06 DIAGNOSIS — M170 Bilateral primary osteoarthritis of knee: Secondary | ICD-10-CM

## 2011-06-06 DIAGNOSIS — E1142 Type 2 diabetes mellitus with diabetic polyneuropathy: Secondary | ICD-10-CM | POA: Diagnosis not present

## 2011-06-06 DIAGNOSIS — I10 Essential (primary) hypertension: Secondary | ICD-10-CM

## 2011-06-06 DIAGNOSIS — E1149 Type 2 diabetes mellitus with other diabetic neurological complication: Secondary | ICD-10-CM | POA: Diagnosis not present

## 2011-06-06 DIAGNOSIS — M1710 Unilateral primary osteoarthritis, unspecified knee: Secondary | ICD-10-CM

## 2011-06-06 DIAGNOSIS — L02519 Cutaneous abscess of unspecified hand: Secondary | ICD-10-CM

## 2011-06-06 DIAGNOSIS — L03119 Cellulitis of unspecified part of limb: Secondary | ICD-10-CM | POA: Diagnosis not present

## 2011-06-06 DIAGNOSIS — M1990 Unspecified osteoarthritis, unspecified site: Secondary | ICD-10-CM

## 2011-06-06 LAB — LIPID PANEL
Cholesterol: 141 mg/dL (ref 0–200)
LDL Cholesterol: 73 mg/dL (ref 0–99)
Triglycerides: 61 mg/dL (ref 0.0–149.0)

## 2011-06-06 LAB — CBC WITH DIFFERENTIAL/PLATELET
Basophils Absolute: 0 10*3/uL (ref 0.0–0.1)
Eosinophils Absolute: 0.1 10*3/uL (ref 0.0–0.7)
HCT: 40.7 % (ref 39.0–52.0)
Hemoglobin: 13.6 g/dL (ref 13.0–17.0)
Lymphs Abs: 1.5 10*3/uL (ref 0.7–4.0)
MCHC: 33.5 g/dL (ref 30.0–36.0)
MCV: 96.7 fl (ref 78.0–100.0)
Monocytes Absolute: 0.7 10*3/uL (ref 0.1–1.0)
Monocytes Relative: 15.1 % — ABNORMAL HIGH (ref 3.0–12.0)
Neutro Abs: 2.1 10*3/uL (ref 1.4–7.7)
Platelets: 125 10*3/uL — ABNORMAL LOW (ref 150.0–400.0)
RDW: 13.5 % (ref 11.5–14.6)

## 2011-06-06 LAB — BASIC METABOLIC PANEL
BUN: 11 mg/dL (ref 6–23)
CO2: 31 mEq/L (ref 19–32)
GFR: 117.76 mL/min (ref >60.00)
Glucose, Bld: 89 mg/dL (ref 70–99)
Potassium: 4.4 mEq/L (ref 3.5–5.1)
Sodium: 135 mEq/L (ref 135–145)

## 2011-06-06 LAB — HEPATIC FUNCTION PANEL
AST: 22 U/L (ref 0–37)
Albumin: 4.5 g/dL (ref 3.5–5.2)
Total Bilirubin: 0.8 mg/dL (ref 0.3–1.2)

## 2011-06-06 NOTE — Assessment & Plan Note (Signed)
Seems to still have good control Will check labs Mild left foot numbness but reasonable circulation

## 2011-06-06 NOTE — Progress Notes (Signed)
Subjective:    Patient ID: Jimmy Brick., male    DOB: 1924/11/24, 78 y.o.   MRN: [id number]  HPI Hand is better No fever Pain is gone  Diabetes seems fine Checks daily Run around 100 No Rx and no hypoglycemic  No palpitations Does some exercises---occ gets some left chest pain during rest phase Breathing has been fine No palpitations No edema Some dizziness--if he gets up too fast and when he gets out of bed. May last for some time--a few minutes. Doesn't have to sit down again. No vertigo No falls  Feels his muscle above medial left knee is getting weaker Uses ointment on it No specific regimen for strengthening  Current Outpatient Prescriptions on File Prior to Visit  Medication Sig Dispense Refill  . aspirin 325 MG EC tablet Take 325 mg by mouth daily.        . cephALEXin (KEFLEX) 500 MG capsule Take 1 capsule (500 mg total) by mouth 3 (three) times daily.  21 capsule  0  . diltiazem (CARDIZEM SR) 120 MG 12 hr capsule Take 1 capsule (120 mg total) by mouth daily.  90 capsule  4  . hydrochlorothiazide 25 MG tablet Take 1 tablet (25 mg total) by mouth daily.  30 tablet  0  . Lancets (ONETOUCH ULTRASOFT) lancets TEST as directed once daily  100 each  11  . lisinopril (PRINIVIL,ZESTRIL) 20 MG tablet Take 1 tablet (20 mg total) by mouth daily.  90 tablet  3  . Multiple Vitamin (MULTIVITAMIN) tablet Take 1 tablet by mouth daily.        . ONE TOUCH ULTRA TEST test strip TEST as directed once daily  100 each  11  . rosuvastatin (CRESTOR) 5 MG tablet Take 1 tablet (5 mg total) by mouth every other day.  90 tablet  3    No Known Allergies  Past Medical History  Diagnosis Date  . Atrial fibrillation   . Coronary artery disease   . Hypertension   . Hyperlipidemia   . Benign prostatic hypertrophy   . Lymphoma   . NIDDM (non-insulin dependent diabetes mellitus)   . Osteoarthritis   . Acute intra-cranial hemorrhage     w/ coumadin  . Melanoma in situ of shoulder   . S/P  tonsillectomy and adenoidectomy 1931  . Inguinal hernia     Past Surgical History  Procedure Date  . Tonsillectomy and adenoidectomy   . Cholecystectomy   . Cataract extraction     Family History  Problem Relation Age of Onset  . Prostate cancer Father   . Heart failure Mother   . Diabetes Mother   . Hypertension Mother   . Arthritis Sister     History   Social History  . Marital Status: Married    Spouse Name: N/A    Number of Children: 3  . Years of Education: N/A   Occupational History  . retired- Acupuncturist    Social History Main Topics  . Smoking status: Former Smoker -- 20 years    Quit date: 03/14/1980  . Smokeless tobacco: Never Used  . Alcohol Use: Yes  . Drug Use: No  . Sexually Active:    Other Topics Concern  . Not on file   Social History Narrative   Enjoys golf and bowling   Review of Systems Some sleep problems with hand bandage---otherwise does okay Nocturia x 2 Appetite is okay Wife still in Memory Care---things going "as well as can be expected"  Objective:   Physical Exam  Constitutional: He appears well-developed and well-nourished. No distress.  Neck: Normal range of motion. Neck supple. No thyromegaly present.  Cardiovascular: Normal rate, normal heart sounds and intact distal pulses.  Exam reveals no gallop.   No murmur heard.      Irregular   Pulmonary/Chest: Effort normal and breath sounds normal. No respiratory distress. He has no wheezes. He has no rales.  Musculoskeletal: He exhibits no edema and no tenderness.       Mild left knee effusion No meniscus findings or ligament laxity (though hard to examine as he couldn't relax his leg)  Lymphadenopathy:    He has no cervical adenopathy.  Skin:       Slight bruising in dorsum of right hand but redness, warmth and swelling have resolved  Psychiatric: He has a normal mood and affect. His behavior is normal.          Assessment & Plan:

## 2011-06-06 NOTE — Assessment & Plan Note (Signed)
Has vague chest pain after exertion Doesn't really sound anginal

## 2011-06-06 NOTE — Assessment & Plan Note (Signed)
Mostly the left knee Discussed working on fitness program Elastic brace provided

## 2011-06-06 NOTE — Assessment & Plan Note (Signed)
Improved Just finish out the antibiotics

## 2011-06-06 NOTE — Assessment & Plan Note (Signed)
Good rate control Asa only due to past bleeding

## 2011-06-06 NOTE — Assessment & Plan Note (Signed)
BP Readings from Last 3 Encounters:  06/06/11 150/98  06/03/11 126/78  06/02/11 138/78   Has had good control No changes for now---esp since he has some ongoing orthostasis symptoms Lab Results  Component Value Date   CREATININE 0.6 12/08/2010

## 2011-06-07 ENCOUNTER — Ambulatory Visit: Payer: Medicare Other | Admitting: Family Medicine

## 2011-06-08 ENCOUNTER — Ambulatory Visit: Payer: Medicare Other | Admitting: Internal Medicine

## 2011-06-08 ENCOUNTER — Encounter: Payer: Self-pay | Admitting: *Deleted

## 2011-06-09 DIAGNOSIS — M1712 Unilateral primary osteoarthritis, left knee: Secondary | ICD-10-CM | POA: Insufficient documentation

## 2011-06-09 NOTE — Progress Notes (Signed)
Addended by: Tillman Abide I on: 06/09/2011 12:59 PM   Modules accepted: Orders

## 2011-06-09 NOTE — Assessment & Plan Note (Signed)
He did go to [facility] at my urging He noted instability in knees and sig increase risk of falls Needs more aggressive intervention Will refer to PT

## 2011-06-10 ENCOUNTER — Ambulatory Visit: Payer: Medicare Other | Admitting: Internal Medicine

## 2011-06-14 ENCOUNTER — Ambulatory Visit: Payer: Medicare Other | Admitting: Internal Medicine

## 2011-06-14 DIAGNOSIS — I4891 Unspecified atrial fibrillation: Secondary | ICD-10-CM | POA: Diagnosis not present

## 2011-06-14 DIAGNOSIS — R279 Unspecified lack of coordination: Secondary | ICD-10-CM | POA: Diagnosis not present

## 2011-06-14 DIAGNOSIS — M6281 Muscle weakness (generalized): Secondary | ICD-10-CM | POA: Diagnosis not present

## 2011-06-15 DIAGNOSIS — M6281 Muscle weakness (generalized): Secondary | ICD-10-CM | POA: Diagnosis not present

## 2011-06-15 DIAGNOSIS — R279 Unspecified lack of coordination: Secondary | ICD-10-CM | POA: Diagnosis not present

## 2011-06-15 DIAGNOSIS — I4891 Unspecified atrial fibrillation: Secondary | ICD-10-CM | POA: Diagnosis not present

## 2011-06-17 DIAGNOSIS — I4891 Unspecified atrial fibrillation: Secondary | ICD-10-CM | POA: Diagnosis not present

## 2011-06-17 DIAGNOSIS — R279 Unspecified lack of coordination: Secondary | ICD-10-CM | POA: Diagnosis not present

## 2011-06-17 DIAGNOSIS — M6281 Muscle weakness (generalized): Secondary | ICD-10-CM | POA: Diagnosis not present

## 2011-06-20 DIAGNOSIS — R279 Unspecified lack of coordination: Secondary | ICD-10-CM | POA: Diagnosis not present

## 2011-06-20 DIAGNOSIS — M6281 Muscle weakness (generalized): Secondary | ICD-10-CM | POA: Diagnosis not present

## 2011-06-20 DIAGNOSIS — I4891 Unspecified atrial fibrillation: Secondary | ICD-10-CM | POA: Diagnosis not present

## 2011-06-22 DIAGNOSIS — I4891 Unspecified atrial fibrillation: Secondary | ICD-10-CM | POA: Diagnosis not present

## 2011-06-22 DIAGNOSIS — R279 Unspecified lack of coordination: Secondary | ICD-10-CM | POA: Diagnosis not present

## 2011-06-22 DIAGNOSIS — M6281 Muscle weakness (generalized): Secondary | ICD-10-CM | POA: Diagnosis not present

## 2011-06-24 DIAGNOSIS — I4891 Unspecified atrial fibrillation: Secondary | ICD-10-CM | POA: Diagnosis not present

## 2011-06-24 DIAGNOSIS — R279 Unspecified lack of coordination: Secondary | ICD-10-CM | POA: Diagnosis not present

## 2011-06-24 DIAGNOSIS — M6281 Muscle weakness (generalized): Secondary | ICD-10-CM | POA: Diagnosis not present

## 2011-06-27 DIAGNOSIS — M6281 Muscle weakness (generalized): Secondary | ICD-10-CM | POA: Diagnosis not present

## 2011-06-27 DIAGNOSIS — I4891 Unspecified atrial fibrillation: Secondary | ICD-10-CM | POA: Diagnosis not present

## 2011-06-27 DIAGNOSIS — R279 Unspecified lack of coordination: Secondary | ICD-10-CM | POA: Diagnosis not present

## 2011-06-29 DIAGNOSIS — R279 Unspecified lack of coordination: Secondary | ICD-10-CM | POA: Diagnosis not present

## 2011-06-29 DIAGNOSIS — M6281 Muscle weakness (generalized): Secondary | ICD-10-CM | POA: Diagnosis not present

## 2011-06-29 DIAGNOSIS — I4891 Unspecified atrial fibrillation: Secondary | ICD-10-CM | POA: Diagnosis not present

## 2011-07-01 DIAGNOSIS — R279 Unspecified lack of coordination: Secondary | ICD-10-CM | POA: Diagnosis not present

## 2011-07-01 DIAGNOSIS — I4891 Unspecified atrial fibrillation: Secondary | ICD-10-CM | POA: Diagnosis not present

## 2011-07-01 DIAGNOSIS — M6281 Muscle weakness (generalized): Secondary | ICD-10-CM | POA: Diagnosis not present

## 2011-07-04 DIAGNOSIS — R279 Unspecified lack of coordination: Secondary | ICD-10-CM | POA: Diagnosis not present

## 2011-07-04 DIAGNOSIS — I4891 Unspecified atrial fibrillation: Secondary | ICD-10-CM | POA: Diagnosis not present

## 2011-07-04 DIAGNOSIS — M6281 Muscle weakness (generalized): Secondary | ICD-10-CM | POA: Diagnosis not present

## 2011-07-06 DIAGNOSIS — R279 Unspecified lack of coordination: Secondary | ICD-10-CM | POA: Diagnosis not present

## 2011-07-06 DIAGNOSIS — I4891 Unspecified atrial fibrillation: Secondary | ICD-10-CM | POA: Diagnosis not present

## 2011-07-06 DIAGNOSIS — M6281 Muscle weakness (generalized): Secondary | ICD-10-CM | POA: Diagnosis not present

## 2011-07-08 DIAGNOSIS — M6281 Muscle weakness (generalized): Secondary | ICD-10-CM | POA: Diagnosis not present

## 2011-07-08 DIAGNOSIS — R279 Unspecified lack of coordination: Secondary | ICD-10-CM | POA: Diagnosis not present

## 2011-07-08 DIAGNOSIS — I4891 Unspecified atrial fibrillation: Secondary | ICD-10-CM | POA: Diagnosis not present

## 2011-07-11 DIAGNOSIS — I4891 Unspecified atrial fibrillation: Secondary | ICD-10-CM | POA: Diagnosis not present

## 2011-07-11 DIAGNOSIS — R279 Unspecified lack of coordination: Secondary | ICD-10-CM | POA: Diagnosis not present

## 2011-07-11 DIAGNOSIS — M6281 Muscle weakness (generalized): Secondary | ICD-10-CM | POA: Diagnosis not present

## 2011-07-13 DIAGNOSIS — M6281 Muscle weakness (generalized): Secondary | ICD-10-CM | POA: Diagnosis not present

## 2011-07-13 DIAGNOSIS — I4891 Unspecified atrial fibrillation: Secondary | ICD-10-CM | POA: Diagnosis not present

## 2011-07-13 DIAGNOSIS — R279 Unspecified lack of coordination: Secondary | ICD-10-CM | POA: Diagnosis not present

## 2011-07-15 DIAGNOSIS — M6281 Muscle weakness (generalized): Secondary | ICD-10-CM | POA: Diagnosis not present

## 2011-07-15 DIAGNOSIS — R279 Unspecified lack of coordination: Secondary | ICD-10-CM | POA: Diagnosis not present

## 2011-07-15 DIAGNOSIS — I4891 Unspecified atrial fibrillation: Secondary | ICD-10-CM | POA: Diagnosis not present

## 2011-07-18 DIAGNOSIS — B351 Tinea unguium: Secondary | ICD-10-CM | POA: Diagnosis not present

## 2011-07-18 DIAGNOSIS — M79609 Pain in unspecified limb: Secondary | ICD-10-CM | POA: Diagnosis not present

## 2011-09-19 DIAGNOSIS — M79609 Pain in unspecified limb: Secondary | ICD-10-CM | POA: Diagnosis not present

## 2011-09-19 DIAGNOSIS — B351 Tinea unguium: Secondary | ICD-10-CM | POA: Diagnosis not present

## 2011-09-20 ENCOUNTER — Other Ambulatory Visit: Payer: Self-pay | Admitting: Internal Medicine

## 2011-10-10 ENCOUNTER — Telehealth: Payer: Self-pay | Admitting: Internal Medicine

## 2011-10-10 NOTE — Telephone Encounter (Signed)
Okay to refill all 4 prescription meds for a year Have him set up follow up in the next couple of months It is okay to skip the crestor till it comes by mail order--but can fill #30 locally if he wants it right away

## 2011-10-10 NOTE — Telephone Encounter (Signed)
Pt is needing all 4 meds refilled in his chart, he is not sure of the names. He is needing a year supply and he uses mail order. Pt has ran out of Crestor completely and is needing it to be called into a pharmacy he would like to use [address] on [address]. For this med

## 2011-10-11 ENCOUNTER — Other Ambulatory Visit: Payer: Self-pay | Admitting: *Deleted

## 2011-10-11 DIAGNOSIS — I10 Essential (primary) hypertension: Secondary | ICD-10-CM

## 2011-10-11 MED ORDER — ROSUVASTATIN CALCIUM 5 MG PO TABS: 5.0000 mg | ORAL_TABLET | ORAL | Status: DC

## 2011-10-11 MED ORDER — HYDROCHLOROTHIAZIDE 25 MG PO TABS: 25.0000 mg | ORAL_TABLET | Freq: Every day | ORAL | Status: DC

## 2011-10-11 MED ORDER — DILTIAZEM HCL ER 120 MG PO CP12: 120.0000 mg | ORAL_CAPSULE | Freq: Every day | ORAL | Status: DC

## 2011-10-11 MED ORDER — LISINOPRIL 20 MG PO TABS: 20.0000 mg | ORAL_TABLET | Freq: Every day | ORAL | Status: DC

## 2011-10-11 NOTE — Telephone Encounter (Signed)
Medication sent to mail order pharmacy (4 Rx) and a 30 day Rx sent to local pharmacy for Crestor as requested.  LMOVM of patient.

## 2011-10-11 NOTE — Telephone Encounter (Signed)
Left message asking patient to call back

## 2011-10-19 ENCOUNTER — Ambulatory Visit (INDEPENDENT_AMBULATORY_CARE_PROVIDER_SITE_OTHER): Payer: Medicare Other | Admitting: Cardiovascular Disease

## 2011-10-19 ENCOUNTER — Other Ambulatory Visit: Payer: Self-pay

## 2011-10-19 ENCOUNTER — Encounter: Payer: Self-pay | Admitting: Cardiovascular Disease

## 2011-10-19 VITALS — BP 114/64 | HR 70 | Ht 66.0 in | Wt 148.0 lb

## 2011-10-19 DIAGNOSIS — R42 Dizziness and giddiness: Secondary | ICD-10-CM

## 2011-10-19 DIAGNOSIS — I4891 Unspecified atrial fibrillation: Secondary | ICD-10-CM | POA: Diagnosis not present

## 2011-10-19 DIAGNOSIS — E785 Hyperlipidemia, unspecified: Secondary | ICD-10-CM | POA: Diagnosis not present

## 2011-10-19 DIAGNOSIS — I10 Essential (primary) hypertension: Secondary | ICD-10-CM

## 2011-10-19 DIAGNOSIS — I2510 Atherosclerotic heart disease of native coronary artery without angina pectoris: Secondary | ICD-10-CM

## 2011-10-19 MED ORDER — DILTIAZEM HCL 30 MG PO TABS: 30.0000 mg | ORAL_TABLET | Freq: Every day | ORAL | Status: DC

## 2011-10-19 NOTE — Assessment & Plan Note (Addendum)
Blood pressure is well controlled on today's visit. No changes made to the medications.  ----> We'll continue the diltiazem 120 mg daily. There is some medication confusion at home.

## 2011-10-19 NOTE — Assessment & Plan Note (Signed)
Currently with no symptoms of angina. No further workup at this time. Continue current medication regimen. 

## 2011-10-19 NOTE — Assessment & Plan Note (Signed)
Possibly orthostatic in  Etiology. We will continue him on the minimal dose of diltiazem given his confusion with medicines and as his blood pressure and heart rate are relatively well controlled on diltiazem 30 mg daily .

## 2011-10-19 NOTE — Assessment & Plan Note (Addendum)
[  He reports that he has not been taking diltiazem 120 mg daily for over one year. We had previously suggested he take diltiazem 30 mg twice a day given his previous bradycardia on the higher dose. He is actually taking diltiazem 30 mg daily. We didn't ingest he could increase the dose to diltiazem 30 mg twice a day. This caused considerable confusion in the office. In the end, we suggested he stay on his diltiazem 30 mg daily given his heart rate was adequate and he had no symptoms. He seemed happy with this. He seemed to recall the high-dose diltiazem causing a slow heart rate as well. We called [facility] and they have significant supply of diltiazem 30 mg tablets for him so he can avoid mail-order.]  ---> We called the mail-order company and they report he is taking 120 mg daily. We will cancel the 30 mg dose and continue 120 mg. If he is unable to obtain this through mail-order, we could try a locally, or even change him to verapamil.

## 2011-10-19 NOTE — Assessment & Plan Note (Signed)
Cholesterol is at goal on the current lipid regimen. No changes to the medications were made.  

## 2011-10-19 NOTE — Progress Notes (Addendum)
Patient ID: Jimmy Brick., male    DOB: 1924-09-03, 78 y.o.   MRN: [id number]  HPI Comments: Jimmy Riggs is a patient of Dr. Alphonsus Sias with history of chronic atrial fibrillation, not on Coumadin due to a remote history of fall with subdural hematoma, hypertension, hyperlipidemia, nonobstructive coronary artery disease and a negative stress test in December 2007 with ejection fraction 65% who presents for routine followup.   He continues to have rare episodes of dizziness. He does not seem very concerned about it on today's visit. He reports it has been a chronic issue, "comes and goes". He goes slowly if symptoms present.  He is most worried about his diltiazem. He was taking diltiazem 30 mg daily despite previous instructions to take 30 mg twice a day. He reports that he is uncertain what dose he is taking.  Review of previous notes indicated bradycardia with the higher dose (120 mg daily). His mail-order pharmacy  reported to him that the diltiazem was difficult to obtain. We called his pharmacy locally, [facility], and they report having significant diltiazem 30 mg pills available.  *We have called his mail order prescription company and they have verified that he is taking 120 mg daily. *   He denies any chest pain, syncope, diaphoresis. No significant lower extremity edema.    EKG shows atrial fibrillation with ventricular rate 70 beats per minute, RBBB, left anterior fascicular block   Outpatient Encounter Prescriptions as of 10/19/2011  Medication Sig Dispense Refill  . aspirin 325 MG EC tablet Take 325 mg by mouth daily.        . hydrochlorothiazide (HYDRODIURIL) 25 MG tablet Take 1 tablet (25 mg total) by mouth daily.  90 tablet  3  . Lancets (ONETOUCH ULTRASOFT) lancets TEST as directed once daily  100 each  11  . lisinopril (PRINIVIL,ZESTRIL) 20 MG tablet Take 1 tablet (20 mg total) by mouth daily.  90 tablet  3  . Multiple Vitamin (MULTIVITAMIN) tablet Take 1 tablet by mouth daily.         . ONE TOUCH ULTRA TEST test strip TEST as INSTRUCTED once daily  100 each  11  . rosuvastatin (CRESTOR) 5 MG tablet Take 1 tablet (5 mg total) by mouth every other day.  30 tablet  0  . diltiazem (CARDIZEM) 30 MG tablet Take 1 tablet (30 mg total) by mouth daily.  90 tablet  4      Review of Systems  Constitutional: Negative.   HENT: Negative.   Eyes: Negative.   Respiratory: Negative.   Cardiovascular: Negative.   Gastrointestinal: Negative.   Musculoskeletal: Negative.   Skin: Negative.   Neurological: Positive for dizziness.  Hematological: Negative.   Psychiatric/Behavioral: Negative.   All other systems reviewed and are negative.    BP 114/64  Pulse 70  Ht 5\' 6"  (1.676 m)  Wt 148 lb (67.132 kg)  BMI 23.89 kg/m2  Physical Exam  Nursing note and vitals reviewed. Constitutional: He is oriented to person, place, and time. He appears well-developed and well-nourished.  HENT:  Head: Normocephalic.  Nose: Nose normal.  Mouth/Throat: Oropharynx is clear and moist.  Eyes: Conjunctivae are normal. Pupils are equal, round, and reactive to light.  Neck: Normal range of motion. Neck supple. No JVD present.  Cardiovascular: Normal rate, normal heart sounds and intact distal pulses.  An irregularly irregular rhythm present. Exam reveals no gallop and no friction rub.   No murmur heard. Pulmonary/Chest: Effort normal and breath sounds normal.  No respiratory distress. He has no wheezes. He has no rales. He exhibits no tenderness.  Abdominal: Soft. Bowel sounds are normal. He exhibits no distension. There is no tenderness.  Musculoskeletal: Normal range of motion. He exhibits no edema and no tenderness.  Lymphadenopathy:    He has no cervical adenopathy.  Neurological: He is alert and oriented to person, place, and time. Coordination normal.  Skin: Skin is warm and dry. No rash noted. No erythema.  Psychiatric: He has a normal mood and affect. His behavior is normal. Judgment and  thought content normal.           Assessment and Plan

## 2011-10-19 NOTE — Patient Instructions (Addendum)
You are doing well. Continue on diltiazem 30 mg one a day  Please call us if you have new issues that need to be addressed before your next appt.  Your physician wants you to follow-up in: 6 months.  You will receive a reminder letter in the mail two months in advance. If you don't receive a letter, please call our office to schedule the follow-up appointment.

## 2011-10-21 ENCOUNTER — Telehealth: Payer: Self-pay

## 2011-10-21 ENCOUNTER — Ambulatory Visit (INDEPENDENT_AMBULATORY_CARE_PROVIDER_SITE_OTHER): Payer: Medicare Other

## 2011-10-21 ENCOUNTER — Other Ambulatory Visit: Payer: Self-pay

## 2011-10-21 VITALS — BP 114/64 | HR 70 | Ht 66.0 in | Wt 148.0 lb

## 2011-10-21 DIAGNOSIS — I4891 Unspecified atrial fibrillation: Secondary | ICD-10-CM | POA: Diagnosis not present

## 2011-10-21 DIAGNOSIS — I10 Essential (primary) hypertension: Secondary | ICD-10-CM | POA: Diagnosis not present

## 2011-10-21 MED ORDER — DILTIAZEM HCL ER 120 MG PO CP12: 120.0000 mg | ORAL_CAPSULE | Freq: Every day | ORAL | Status: DC

## 2011-10-21 MED ORDER — DILTIAZEM HCL ER COATED BEADS 120 MG PO CP24: 120.0000 mg | ORAL_CAPSULE | Freq: Every day | ORAL | Status: DC

## 2011-10-21 NOTE — Telephone Encounter (Signed)
Dr. Mariah Milling asks that we call mail order pharmacy to confirm which dose he has been receiving through the mail.  I called [facility] and confirmed pt has been getting diltiazem SR 120 mg via mail order.

## 2011-10-21 NOTE — Progress Notes (Signed)
Pt brings in bottles from home. He shows me a bottle of diltiazem CD 120 mg tabs he used to take.  This was stopped 1 year ago.  It was switched to cardizem SR 120 mg tablets, which he has been taking for 1 year now.  This is the RX he tried to get refilled by [facility] (mail order). When he tried this he was told they are unable to get from manufacturer and is on back order.  That is when he came in for office visit 2 days ago and tried explaining to Dr. Mariah Milling, but he did not bring his list of meds/bottles with him at that time, so Dr. Mariah Milling was unclear as to what dose/release/etc pt was speaking of at appt 2 days ago. Now that I am able to see bottle, I explained to pt Dr. Mariah Milling wants pt to stay on exact dose he had been taking (diltiazem SR) and I will try to call local pharmacy [facility]) to make sure they have this med in stock.  He asks that I first try [facility]. I tried [facility] and she says this med is still on back order. I explained this to pt. He says ok to have it refilled at [facility].  i told him I would call [facility] again just to double check, to make sure I heard pharmacist right, that they DO have this in stock.  I spoke with Maralyn Sago at [facility]. She confirms having diltiazem SR 120 mg in stock.  Pt is ok with me sending this medication in to pharmacy. i will do this.

## 2011-10-21 NOTE — Patient Instructions (Addendum)
Stay on same strength/dose of diltiazem that you have been taking for over a year.  Will send RX refill to local pharmacy.

## 2011-10-21 NOTE — Telephone Encounter (Signed)
[facility] says they last refilled Diltiazem SR 120 mg in April (90 day supply). Dr. Mariah Milling says if pt has been taking this dose all along, ok to continue 120 mg tabs. He asks that we make sure to explain the importance of pt bringing actual bottles to appointments so we know actual dose of meds.  I wil call pt to inform.

## 2011-10-21 NOTE — Telephone Encounter (Signed)
I called pt to inform.  He is very upset, says he used to take diltiazem CD 120 mg but Dr. Mariah Milling changed him to "12 hour release" 120 mg tablets.  He wants to know what he is to take. I explained, per Dr. Mariah Milling, he should continue taking what he has been taking/getting from mail order (last filled in April). He began to raise his voice, saying, "thats the trouble! That's what I cannot get from manufacturer!".  I had him hold while I called [facility] to see if they can get this med for pt.  I spoke with [facility] who says they have both diltiazem CD and SR in stock (120 mg).  I will send RX. I went back to tell pt they have this in stock. He says he is still confused as to why Dr. Mariah Milling is ordering the 120 mg tabs. I explained it is b/c this is what he has been taking all along. I also explained the 30 mg tablets should be taken back to pharmacy since this was a mistake.   Pt became very agitated stating how confused I am, etc.  I advised him to bring his empty bottle here to office so I can confirm which medication he is taking and so we can get this worked out. He angrily agrees to bring bottle to office.

## 2011-10-21 NOTE — Telephone Encounter (Signed)
Pt called to say he just picked up RX from pharmacy and rec'd diltiazem 30 mg tablets.  He is upset and does not understand why he got switched to 30 mg from diltiazem SR 120 mg.  I reminded him of the long discussion Dr. Mariah Milling had with him 2 days ago in office. I reminded him of the trouble he told us he had with getting the diltiazem 30 mg tabs and that is why he had been taking 120 mg tablets.  He says none of that is true, that "they" had trouble getting the diltiazem SR 120 mg tablets from manufacturer, yet he says he has been on this dose x 1 year. I also explained Dr. Mariah Milling wants pt to take the 30 mg tabs BID instead of the 120 mg SR tabs d/t bradycardia. Pt continues to report frustration and confusion.  I told him I will have to discuss with dr. Mariah Milling and call him back. Understanding verb.

## 2011-11-01 ENCOUNTER — Other Ambulatory Visit: Payer: Self-pay | Admitting: *Deleted

## 2011-11-01 MED ORDER — DILTIAZEM HCL ER COATED BEADS 120 MG PO CP24: 120.0000 mg | ORAL_CAPSULE | Freq: Every day | ORAL | Status: DC

## 2011-11-01 NOTE — Telephone Encounter (Signed)
Okay to substitute another diltiazem Check with pharmacy to see what they have

## 2011-11-01 NOTE — Telephone Encounter (Signed)
Pharmacy suggested DILTIAZEM CD CAPS, rx sent to pharmacy by e-script

## 2011-11-01 NOTE — Telephone Encounter (Signed)
Fax from pharmacy that the DILTIAZEM SR 120 MG is on back order and is not available, per fax can you provide an alternative rx? ( DILTIAZEM CD CAPS?)

## 2011-12-07 ENCOUNTER — Ambulatory Visit (INDEPENDENT_AMBULATORY_CARE_PROVIDER_SITE_OTHER): Payer: Medicare Other | Admitting: Internal Medicine

## 2011-12-07 ENCOUNTER — Encounter: Payer: Self-pay | Admitting: Internal Medicine

## 2011-12-07 VITALS — BP 148/78 | HR 65 | Temp 97.5°F | Ht 66.0 in | Wt 149.0 lb

## 2011-12-07 DIAGNOSIS — M7130 Other bursal cyst, unspecified site: Secondary | ICD-10-CM

## 2011-12-07 DIAGNOSIS — E1149 Type 2 diabetes mellitus with other diabetic neurological complication: Secondary | ICD-10-CM | POA: Diagnosis not present

## 2011-12-07 DIAGNOSIS — E1142 Type 2 diabetes mellitus with diabetic polyneuropathy: Secondary | ICD-10-CM

## 2011-12-07 DIAGNOSIS — R7989 Other specified abnormal findings of blood chemistry: Secondary | ICD-10-CM | POA: Diagnosis not present

## 2011-12-07 DIAGNOSIS — I10 Essential (primary) hypertension: Secondary | ICD-10-CM | POA: Diagnosis not present

## 2011-12-07 DIAGNOSIS — D2330 Other benign neoplasm of skin of unspecified part of face: Secondary | ICD-10-CM | POA: Diagnosis not present

## 2011-12-07 DIAGNOSIS — L819 Disorder of pigmentation, unspecified: Secondary | ICD-10-CM | POA: Diagnosis not present

## 2011-12-07 DIAGNOSIS — L570 Actinic keratosis: Secondary | ICD-10-CM | POA: Diagnosis not present

## 2011-12-07 DIAGNOSIS — I4891 Unspecified atrial fibrillation: Secondary | ICD-10-CM | POA: Diagnosis not present

## 2011-12-07 DIAGNOSIS — D485 Neoplasm of uncertain behavior of skin: Secondary | ICD-10-CM | POA: Diagnosis not present

## 2011-12-07 DIAGNOSIS — E785 Hyperlipidemia, unspecified: Secondary | ICD-10-CM

## 2011-12-07 DIAGNOSIS — R799 Abnormal finding of blood chemistry, unspecified: Secondary | ICD-10-CM

## 2011-12-07 DIAGNOSIS — D1800 Hemangioma unspecified site: Secondary | ICD-10-CM | POA: Diagnosis not present

## 2011-12-07 NOTE — Assessment & Plan Note (Signed)
/   Lab Results  Component Value Date   LDLCALC 73 06/06/2011   No problems with the med Labs next visit

## 2011-12-07 NOTE — Progress Notes (Signed)
Subjective:    Patient ID: Jimmy Brick., male    DOB: 1925/01/31, 78 y.o.   MRN: [id number]  HPI Has cyst on left 3rd DIP  Sticks up and he is afraid of hitting and breaking it  No other new concerns Continues to visit wife at [facility] daily. Spends much of his day there with her. Doesn't eat there though This is hard but he is used to routine  Checks sugars daily Usually under 100  No meds still  No palpitations No chest pain or dyspnea Works out at fitness center daily  Left knee pain at times Just uses topical med and brace  Current Outpatient Prescriptions on File Prior to Visit  Medication Sig Dispense Refill  . aspirin 325 MG EC tablet Take 325 mg by mouth daily.        Marland Kitchen diltiazem (CARDIZEM CD) 120 MG 24 hr capsule Take 1 capsule (120 mg total) by mouth daily.  90 capsule  3  . hydrochlorothiazide (HYDRODIURIL) 25 MG tablet Take 1 tablet (25 mg total) by mouth daily.  90 tablet  3  . Lancets (ONETOUCH ULTRASOFT) lancets TEST as directed once daily  100 each  11  . lisinopril (PRINIVIL,ZESTRIL) 20 MG tablet Take 1 tablet (20 mg total) by mouth daily.  90 tablet  3  . Multiple Vitamin (MULTIVITAMIN) tablet Take 1 tablet by mouth daily.        . ONE TOUCH ULTRA TEST test strip TEST as INSTRUCTED once daily  100 each  11  . rosuvastatin (CRESTOR) 5 MG tablet Take 1 tablet (5 mg total) by mouth every other day.  30 tablet  0    No Known Allergies  Past Medical History  Diagnosis Date  . Atrial fibrillation   . Coronary artery disease   . Hypertension   . Hyperlipidemia   . Benign prostatic hypertrophy   . Lymphoma   . NIDDM (non-insulin dependent diabetes mellitus)   . Osteoarthritis   . Acute intra-cranial hemorrhage     w/ coumadin  . Melanoma in situ of shoulder   . S/P tonsillectomy and adenoidectomy 1931  . Inguinal hernia     Past Surgical History  Procedure Date  . Tonsillectomy and adenoidectomy   . Cholecystectomy   . Cataract extraction      Family History  Problem Relation Age of Onset  . Prostate cancer Father   . Heart failure Mother   . Diabetes Mother   . Hypertension Mother   . Arthritis Sister     History   Social History  . Marital Status: Married    Spouse Name: N/A    Number of Children: 3  . Years of Education: N/A   Occupational History  . retired- Acupuncturist    Social History Main Topics  . Smoking status: Former Smoker -- 0.0 packs/day for 20 years    Quit date: 03/14/1980  . Smokeless tobacco: Never Used  . Alcohol Use: Yes  . Drug Use: No  . Sexually Active:    Other Topics Concern  . Not on file   Social History Narrative   Enjoys golf and bowling  \Review of Systems Has persistent "mole" in left preauricular space Bleeds there when he shaves Appetite is fine Weight is stable    Objective:   Physical Exam  Neck: Normal range of motion. Neck supple. No thyromegaly present.  Cardiovascular: Normal rate, normal heart sounds and intact distal pulses.  Exam reveals no gallop.  No murmur heard.      Normal pedal pulse on right, faint on left Irregular rhythm  Pulmonary/Chest: Breath sounds normal. No respiratory distress. He has no wheezes. He has no rales.  Musculoskeletal: He exhibits no edema and no tenderness.       Firm synovial? Cyst at left 3rd DIP of hand  Lymphadenopathy:    He has no cervical adenopathy.  Skin:       Benign appearing fleshy mole in left preauricular space with superficial skin slice next to it  Psychiatric: He has a normal mood and affect. His behavior is normal.          Assessment & Plan:

## 2011-12-07 NOTE — Assessment & Plan Note (Signed)
Not sure if cyst communicates with joint Will send for hand evaluation since it bothers him

## 2011-12-07 NOTE — Assessment & Plan Note (Signed)
Good rate control on diltiazem ASA due to past intracranial bleed

## 2011-12-07 NOTE — Assessment & Plan Note (Signed)
BP Readings from Last 3 Encounters:  12/07/11 148/78  10/21/11 114/64  10/19/11 114/64   Up some today but generally okay No changes needed at this point

## 2011-12-07 NOTE — Assessment & Plan Note (Signed)
Well controlled on diet only Will check A1c

## 2011-12-12 ENCOUNTER — Encounter: Payer: Self-pay | Admitting: *Deleted

## 2011-12-12 DIAGNOSIS — D492 Neoplasm of unspecified behavior of bone, soft tissue, and skin: Secondary | ICD-10-CM | POA: Diagnosis not present

## 2011-12-12 DIAGNOSIS — M19049 Primary osteoarthritis, unspecified hand: Secondary | ICD-10-CM | POA: Diagnosis not present

## 2011-12-13 ENCOUNTER — Other Ambulatory Visit: Payer: Self-pay | Admitting: Internal Medicine

## 2011-12-20 DIAGNOSIS — B351 Tinea unguium: Secondary | ICD-10-CM | POA: Diagnosis not present

## 2011-12-20 DIAGNOSIS — M79609 Pain in unspecified limb: Secondary | ICD-10-CM | POA: Diagnosis not present

## 2011-12-26 DIAGNOSIS — M6740 Ganglion, unspecified site: Secondary | ICD-10-CM | POA: Diagnosis not present

## 2012-01-02 DIAGNOSIS — M6740 Ganglion, unspecified site: Secondary | ICD-10-CM | POA: Diagnosis not present

## 2012-01-06 DIAGNOSIS — R350 Frequency of micturition: Secondary | ICD-10-CM | POA: Diagnosis not present

## 2012-01-06 DIAGNOSIS — N138 Other obstructive and reflux uropathy: Secondary | ICD-10-CM | POA: Diagnosis not present

## 2012-01-06 DIAGNOSIS — R972 Elevated prostate specific antigen [PSA]: Secondary | ICD-10-CM | POA: Diagnosis not present

## 2012-01-06 DIAGNOSIS — N403 Nodular prostate with lower urinary tract symptoms: Secondary | ICD-10-CM | POA: Diagnosis not present

## 2012-01-06 DIAGNOSIS — R339 Retention of urine, unspecified: Secondary | ICD-10-CM | POA: Diagnosis not present

## 2012-01-10 DIAGNOSIS — Z23 Encounter for immunization: Secondary | ICD-10-CM | POA: Diagnosis not present

## 2012-02-02 DIAGNOSIS — L738 Other specified follicular disorders: Secondary | ICD-10-CM | POA: Diagnosis not present

## 2012-02-02 DIAGNOSIS — D485 Neoplasm of uncertain behavior of skin: Secondary | ICD-10-CM | POA: Diagnosis not present

## 2012-02-02 DIAGNOSIS — L819 Disorder of pigmentation, unspecified: Secondary | ICD-10-CM | POA: Diagnosis not present

## 2012-02-02 DIAGNOSIS — C44319 Basal cell carcinoma of skin of other parts of face: Secondary | ICD-10-CM | POA: Diagnosis not present

## 2012-02-15 ENCOUNTER — Telehealth: Payer: Self-pay | Admitting: *Deleted

## 2012-02-15 NOTE — Telephone Encounter (Signed)
Patient dropped off his 2014 coverage plan and his Diltiazem 120mg  ER will no longer be covered pt is requesting to switch to cheaper medication. Per note from Dr.Letvak he doesn't recommend changing because he would need multiple doses of the cheaper medication.  Marland Kitchenleft message to have patient return my call.

## 2012-02-15 NOTE — Telephone Encounter (Signed)
Spoke with patient and advised results, he will stay on the same medication

## 2012-03-21 DIAGNOSIS — M79609 Pain in unspecified limb: Secondary | ICD-10-CM | POA: Diagnosis not present

## 2012-03-21 DIAGNOSIS — B351 Tinea unguium: Secondary | ICD-10-CM | POA: Diagnosis not present

## 2012-04-03 DIAGNOSIS — E119 Type 2 diabetes mellitus without complications: Secondary | ICD-10-CM | POA: Diagnosis not present

## 2012-04-03 DIAGNOSIS — Z961 Presence of intraocular lens: Secondary | ICD-10-CM | POA: Diagnosis not present

## 2012-04-03 DIAGNOSIS — H524 Presbyopia: Secondary | ICD-10-CM | POA: Diagnosis not present

## 2012-04-03 LAB — HM DIABETES EYE EXAM

## 2012-04-25 ENCOUNTER — Encounter: Payer: Self-pay | Admitting: Internal Medicine

## 2012-04-25 ENCOUNTER — Other Ambulatory Visit: Payer: Self-pay | Admitting: Optometry

## 2012-05-30 DIAGNOSIS — M79609 Pain in unspecified limb: Secondary | ICD-10-CM | POA: Diagnosis not present

## 2012-06-14 ENCOUNTER — Ambulatory Visit (INDEPENDENT_AMBULATORY_CARE_PROVIDER_SITE_OTHER)
Admission: RE | Admit: 2012-06-14 | Discharge: 2012-06-14 | Disposition: A | Discharge status: Home / Self Care | Payer: Medicare Other | Source: Ambulatory Visit | Attending: Internal Medicine | Admitting: Internal Medicine

## 2012-06-14 ENCOUNTER — Ambulatory Visit (INDEPENDENT_AMBULATORY_CARE_PROVIDER_SITE_OTHER): Payer: Medicare Other | Admitting: Internal Medicine

## 2012-06-14 ENCOUNTER — Encounter: Payer: Self-pay | Admitting: Internal Medicine

## 2012-06-14 VITALS — BP 132/70 | HR 63 | Temp 97.2°F | Wt 155.0 lb

## 2012-06-14 DIAGNOSIS — E785 Hyperlipidemia, unspecified: Secondary | ICD-10-CM | POA: Diagnosis not present

## 2012-06-14 DIAGNOSIS — M1710 Unilateral primary osteoarthritis, unspecified knee: Secondary | ICD-10-CM

## 2012-06-14 DIAGNOSIS — E1149 Type 2 diabetes mellitus with other diabetic neurological complication: Secondary | ICD-10-CM

## 2012-06-14 DIAGNOSIS — M1712 Unilateral primary osteoarthritis, left knee: Secondary | ICD-10-CM

## 2012-06-14 DIAGNOSIS — I10 Essential (primary) hypertension: Secondary | ICD-10-CM

## 2012-06-14 DIAGNOSIS — Z1331 Encounter for screening for depression: Secondary | ICD-10-CM | POA: Diagnosis not present

## 2012-06-14 DIAGNOSIS — IMO0002 Reserved for concepts with insufficient information to code with codable children: Secondary | ICD-10-CM

## 2012-06-14 MED ORDER — TRAMADOL HCL 50 MG PO TABS: 25.0000 mg | ORAL_TABLET | Freq: Three times a day (TID) | ORAL | Status: DC | PRN

## 2012-06-14 NOTE — Assessment & Plan Note (Signed)
Due for labs

## 2012-06-14 NOTE — Assessment & Plan Note (Signed)
Will check x-ray Start tylenol and prn tramadol Consider cortisone injection May need ortho down the line

## 2012-06-14 NOTE — Progress Notes (Signed)
Subjective:    Patient ID: Jimmy Brick., male    DOB: 09-11-1924, 79 y.o.   MRN: [id number]  HPI Having ongoing problems with left knee pain Has gotten very bad Wife has died--now hopes to get out to walk, play golf, etc  No swelling No pain sitting or in bed Pain comes immediately with starting activities Hasn't tried any pain meds  Checks sugars daily Usually under 120 No reactions ---no meds either  No chest pain No edema No SOB  Current Outpatient Prescriptions on File Prior to Visit  Medication Sig Dispense Refill  . aspirin 325 MG EC tablet Take 325 mg by mouth daily.        Marland Kitchen diltiazem (CARDIZEM CD) 120 MG 24 hr capsule Take 1 capsule (120 mg total) by mouth daily.  90 capsule  3  . hydrochlorothiazide (HYDRODIURIL) 25 MG tablet Take 1 tablet (25 mg total) by mouth daily.  90 tablet  3  . Lancets (ONETOUCH ULTRASOFT) lancets TEST as directed once daily  100 each  3  . lisinopril (PRINIVIL,ZESTRIL) 20 MG tablet Take 1 tablet (20 mg total) by mouth daily.  90 tablet  3  . Multiple Vitamin (MULTIVITAMIN) tablet Take 1 tablet by mouth daily.        . ONE TOUCH ULTRA TEST test strip TEST as INSTRUCTED once daily  100 each  11  . rosuvastatin (CRESTOR) 5 MG tablet Take 1 tablet (5 mg total) by mouth every other day.  30 tablet  0   No current facility-administered medications on file prior to visit.    No Known Allergies  Past Medical History  Diagnosis Date  . Atrial fibrillation   . Coronary artery disease   . Hypertension   . Hyperlipidemia   . Benign prostatic hypertrophy   . Lymphoma   . NIDDM (non-insulin dependent diabetes mellitus)   . Osteoarthritis   . Acute intra-cranial hemorrhage     w/ coumadin  . Melanoma in situ of shoulder   . S/P tonsillectomy and adenoidectomy 1931  . Inguinal hernia     Past Surgical History  Procedure Laterality Date  . Tonsillectomy and adenoidectomy    . Cholecystectomy    . Cataract extraction      Family  History  Problem Relation Age of Onset  . Prostate cancer Father   . Heart failure Mother   . Diabetes Mother   . Hypertension Mother   . Arthritis Sister     History   Social History  . Marital Status: Widowed    Spouse Name: N/A    Number of Children: 3  . Years of Education: N/A   Occupational History  . retired- Acupuncturist    Social History Main Topics  . Smoking status: Former Smoker -- 0.00 packs/day for 20 years    Quit date: 03/14/1980  . Smokeless tobacco: Never Used  . Alcohol Use: Yes  . Drug Use: No  . Sexually Active:    Other Topics Concern  . Not on file   Social History Narrative   Wife died 06-02-22   Review of Systems Sleeps okay--feels he could get better sleep if he was tired from increased activity Appetite is good     Objective:   Physical Exam  Constitutional: He appears well-developed and well-nourished. No distress.  Neck: Normal range of motion. Neck supple. No thyromegaly present.  Cardiovascular: Normal rate, regular rhythm, normal heart sounds and intact distal pulses.  Exam reveals no gallop.  No murmur heard. Normal pulse right foot---diminished on left  Pulmonary/Chest: Effort normal and breath sounds normal. No respiratory distress. He has no wheezes. He has no rales.  Abdominal: Soft. There is no tenderness.  Musculoskeletal: He exhibits no edema.  Mld deformation of left knee with slight effusion Some crepitus No ligament or meniscus findings  Lymphadenopathy:    He has no cervical adenopathy.  Skin:  Scaly red areas on plantar feet No ulcers  Psychiatric: He has a normal mood and affect. His behavior is normal.          Assessment & Plan:

## 2012-06-14 NOTE — Patient Instructions (Signed)
Start the acetaminophen extended release 650mg  three times a day. If the pain is bad, you can try the tramadol pain medicine in addition. If the pain stays bad, set up appointment for me to do a cortisone shot here. When you buy a new bottle of aspirin, switch to the lower 81mg  size.

## 2012-06-14 NOTE — Assessment & Plan Note (Signed)
Still seems to have good control Due for labs 

## 2012-06-14 NOTE — Assessment & Plan Note (Signed)
BP Readings from Last 3 Encounters:  06/14/12 132/70  12/07/11 148/78  10/21/11 114/64   Good control No changes needed

## 2012-06-15 ENCOUNTER — Encounter: Payer: Self-pay | Admitting: *Deleted

## 2012-06-15 LAB — HEPATIC FUNCTION PANEL
ALT: 13 U/L (ref 0–53)
AST: 19 U/L (ref 0–37)
Bilirubin, Direct: 0.2 mg/dL (ref 0.0–0.3)
Total Bilirubin: 0.9 mg/dL (ref 0.3–1.2)

## 2012-06-15 LAB — LIPID PANEL
HDL: 44.9 mg/dL (ref >39.00)
LDL Cholesterol: 72 mg/dL (ref 0–99)
Total CHOL/HDL Ratio: 3
Triglycerides: 57 mg/dL (ref 0.0–149.0)

## 2012-06-15 LAB — CBC WITH DIFFERENTIAL/PLATELET
Basophils Relative: 0.4 % (ref 0.0–3.0)
Eosinophils Absolute: 0.1 10*3/uL (ref 0.0–0.7)
Eosinophils Relative: 1.9 % (ref 0.0–5.0)
Lymphocytes Relative: 37.6 % (ref 12.0–46.0)
Neutrophils Relative %: 44.4 % (ref 43.0–77.0)
RBC: 4.15 Mil/uL — ABNORMAL LOW (ref 4.22–5.81)
WBC: 4.2 10*3/uL — ABNORMAL LOW (ref 4.5–10.5)

## 2012-06-15 LAB — BASIC METABOLIC PANEL
Calcium: 9.1 mg/dL (ref 8.4–10.5)
Creatinine, Ser: 0.7 mg/dL (ref 0.4–1.5)

## 2012-06-15 LAB — HEMOGLOBIN A1C: Hgb A1c MFr Bld: 5.9 % (ref 4.6–6.5)

## 2012-06-28 ENCOUNTER — Ambulatory Visit (INDEPENDENT_AMBULATORY_CARE_PROVIDER_SITE_OTHER): Payer: Medicare Other | Admitting: Family Medicine

## 2012-06-28 ENCOUNTER — Telehealth: Payer: Self-pay | Admitting: Internal Medicine

## 2012-06-28 ENCOUNTER — Encounter: Payer: Self-pay | Admitting: Family Medicine

## 2012-06-28 VITALS — BP 140/80 | HR 58 | Wt 159.0 lb

## 2012-06-28 DIAGNOSIS — M1710 Unilateral primary osteoarthritis, unspecified knee: Secondary | ICD-10-CM

## 2012-06-28 DIAGNOSIS — M1712 Unilateral primary osteoarthritis, left knee: Secondary | ICD-10-CM

## 2012-06-28 NOTE — Telephone Encounter (Signed)
Caller: Lamone/Patient; Phone: [phone]; Reason for Call: Patient calling to schedule an injection to the left knee.  States that Dr.  Alphonsus Sias told him to call and schedule same if the pain medication given did not help the pain.  He can come in today.   Transferred to the office for appt.  With Dr.  Patsy Lager per Dr.  Karle Starch instructions in the note.  Scheduled for 12noon on 4/21 with Dr.  Patsy Lager.  Staff member went and talked with Dr.  Patsy Lager and was allowed to do a workin at [employer] today.  Patient advised and repeated the instructions.

## 2012-06-28 NOTE — Progress Notes (Signed)
Nature conservation officer at [facility] [address] [place] [address] Phone: [phone] Fax: [phone]  Date:  06/28/2012   Name:  Jimmy Riggs.   DOB:  1924-07-08   MRN:  [id number] Gender: male Age: 79 y.o.  Primary Physician:  Tillman Abide, MD  Evaluating MD: Hannah Beat, MD   Chief Complaint: Knee Pain   History of Present Illness:  Jimmy Riggs. is a 79 y.o. pleasant patient who presents with the following:  L knee OA:  Over 1 year in length. Tried some meds recently from Dr. Alphonsus Sias, and he is not had any significant symptom relief with a combination of Tylenol and tramadol. He is having a lot of pain in his knee, some intermittent swelling, buckling. No mechanical locking up.no history of prior traumatic injury or fracture, or operative intervention in the affected knee.  Dg Knee 1-2 Views Left  06/14/2012  *RADIOLOGY REPORT*  Clinical Data: Osteoarthritis,  pain  LEFT KNEE - 1-2 VIEW  Comparison: None  Findings: Moderate to advanced tricompartmental degenerative change.  This is most severe in the medial compartment with joint space narrowing and irregularity of the medial femoral condyle and tibial plateau with associated osteophyte formation.  There is a joint effusion.  Mild degenerative change laterally and in the patella.  There is arterial calcification.  Negative for fracture.  IMPRESSION: Osteoarthritis, most severe in the medial compartment.  There is a moderate joint effusion.   Original Report Authenticated By: Janeece Riggers, M.D.     --Independently reviewed by myself. These do appear to be non-weightbearing films. Moderate to severe tricompartmental arthritis, worse in the medial compartment with significant alteration of the medial femoral condyle. There is flattening and irregularity. Multiple osteophytosis. For greater assessment of osteoarthritic changes, recommend weightbearing knee films or 30 degrees of flexion AP weight-bearing  films.  Patient Active Problem List  Diagnosis  . DIABETES MELLITUS, TYPE II, WITH NEUROLOGICAL COMPLICATIONS  . HYPERLIPIDEMIA  . HYPERTENSION  . CORONARY ARTERY DISEASE  . ATRIAL FIBRILLATION  . BENIGN PROSTATIC HYPERTROPHY  . Osteoarthritis of left knee  . Synovial cyst    Past Medical History  Diagnosis Date  . Atrial fibrillation   . Coronary artery disease   . Hypertension   . Hyperlipidemia   . Benign prostatic hypertrophy   . Lymphoma   . NIDDM (non-insulin dependent diabetes mellitus)   . Osteoarthritis   . Acute intra-cranial hemorrhage     w/ coumadin  . Melanoma in situ of shoulder   . S/P tonsillectomy and adenoidectomy 1931  . Inguinal hernia     Past Surgical History  Procedure Laterality Date  . Tonsillectomy and adenoidectomy    . Cholecystectomy    . Cataract extraction      History   Social History  . Marital Status: Widowed    Spouse Name: N/A    Number of Children: 3  . Years of Education: N/A   Occupational History  . retired- Acupuncturist    Social History Main Topics  . Smoking status: Former Smoker -- 0.00 packs/day for 20 years    Quit date: 03/14/1980  . Smokeless tobacco: Never Used  . Alcohol Use: Yes  . Drug Use: No  . Sexually Active:    Other Topics Concern  . Not on file   Social History Narrative   Wife died June 12, 2022    Family History  Problem Relation Age of Onset  . Prostate cancer Father   . Heart  failure Mother   . Diabetes Mother   . Hypertension Mother   . Arthritis Sister     No Known Allergies  Medication list has been reviewed and updated.  Outpatient Prescriptions Prior to Visit  Medication Sig Dispense Refill  . acetaminophen (TYLENOL) 650 MG CR tablet Take 650 mg by mouth 3 (three) times daily.      Marland Kitchen aspirin EC 81 MG tablet Take 81 mg by mouth daily.      Marland Kitchen diltiazem (CARDIZEM CD) 120 MG 24 hr capsule Take 1 capsule (120 mg total) by mouth daily.  90 capsule  3  . hydrochlorothiazide  (HYDRODIURIL) 25 MG tablet Take 1 tablet (25 mg total) by mouth daily.  90 tablet  3  . Lancets (ONETOUCH ULTRASOFT) lancets TEST as directed once daily  100 each  3  . lisinopril (PRINIVIL,ZESTRIL) 20 MG tablet Take 1 tablet (20 mg total) by mouth daily.  90 tablet  3  . Multiple Vitamin (MULTIVITAMIN) tablet Take 1 tablet by mouth daily.        . ONE TOUCH ULTRA TEST test strip TEST as INSTRUCTED once daily  100 each  11  . rosuvastatin (CRESTOR) 5 MG tablet Take 1 tablet (5 mg total) by mouth every other day.  30 tablet  0  . traMADol (ULTRAM) 50 MG tablet Take 0.5-1 tablets (25-50 mg total) by mouth 3 (three) times daily as needed for pain.  60 tablet  0   No facility-administered medications prior to visit.    Review of Systems:   GEN: No fevers, chills. Nontoxic. Primarily MSK c/o today. MSK: Detailed in the HPI GI: tolerating PO intake without difficulty Neuro: No numbness, parasthesias, or tingling associated. Otherwise the pertinent positives of the ROS are noted above.    Physical Examination: BP 140/80  Pulse 58  Wt 159 lb (72.122 kg)  BMI 25.68 kg/m2  SpO2 96%  Ideal Body Weight:     GEN: WDWN, NAD, Non-toxic, Alert & Oriented x 3 HEENT: Atraumatic, Normocephalic.  Ears and Nose: No external deformity. EXTR: No clubbing/cyanosis/edema NEURO: Normal gait.  PSYCH: Normally interactive. Conversant. Not depressed or anxious appearing.  Calm demeanor.  KNEE: LEFT, stable to varus and valgus stress. Tenderness is present on the medial and lateral joint lines. Negative anterior and posterior drawer testing. Crepitus at the anterior patella.  Assessment and Plan: Osteoarthritis of left knee   Arthritis exacerbation. Failure to control symptoms with basic conservative management including Tylenol, Tramadol.  It appears his renal function is normal. Low-dose scheduled anti inflammatory could be considered. If there is concern for systemic toxicity due to age, Voltaren gel  or Pennsaid could be used, also. But I would like to completely defer that to PCP.  Knee Injection, LEFT Patient verbally consented to procedure. Risks (including potential rare risk of infection), benefits, and alternatives explained. Sterilely prepped with Chloraprep. Ethyl cholride used for anesthesia. 8 cc Lidocaine 1% mixed with 2 cc of Depo-Medrol 40 mg injected using the anterolateral approach without difficulty. No complications with procedure and tolerated well. Patient had decreased pain post-injection.   Signed, Elpidio Galea. Arrianna Catala, MD 06/28/2012 4:40 PM

## 2012-07-06 ENCOUNTER — Other Ambulatory Visit: Payer: Self-pay | Admitting: Internal Medicine

## 2012-07-30 ENCOUNTER — Other Ambulatory Visit: Payer: Self-pay | Admitting: Internal Medicine

## 2012-07-30 NOTE — Telephone Encounter (Signed)
rx sent to pharmacy by e-script  

## 2012-07-30 NOTE — Telephone Encounter (Signed)
Okay #60 x 0 

## 2012-08-01 DIAGNOSIS — D239 Other benign neoplasm of skin, unspecified: Secondary | ICD-10-CM | POA: Diagnosis not present

## 2012-08-01 DIAGNOSIS — Z85828 Personal history of other malignant neoplasm of skin: Secondary | ICD-10-CM | POA: Diagnosis not present

## 2012-08-01 DIAGNOSIS — L570 Actinic keratosis: Secondary | ICD-10-CM | POA: Diagnosis not present

## 2012-08-08 ENCOUNTER — Other Ambulatory Visit: Payer: Self-pay | Admitting: Internal Medicine

## 2012-08-08 DIAGNOSIS — M79609 Pain in unspecified limb: Secondary | ICD-10-CM | POA: Diagnosis not present

## 2012-08-08 DIAGNOSIS — B351 Tinea unguium: Secondary | ICD-10-CM | POA: Diagnosis not present

## 2012-08-21 ENCOUNTER — Other Ambulatory Visit: Payer: Self-pay | Admitting: Internal Medicine

## 2012-08-22 NOTE — Telephone Encounter (Signed)
Okay #60 x 0 

## 2012-08-22 NOTE — Telephone Encounter (Signed)
rx sent to pharmacy by e-script  

## 2012-09-10 ENCOUNTER — Other Ambulatory Visit: Payer: Self-pay | Admitting: Internal Medicine

## 2012-09-10 NOTE — Telephone Encounter (Signed)
Last refilled 08/21/2012, spoke with the pharmacy and pt picked up on 08/22/2012  Spoke with patient and he states he didn't pick it up and he's almost out of medication, please advise

## 2012-09-11 NOTE — Telephone Encounter (Signed)
Okay #60 x 0 Have him set up appt in next 1-2 months so I can see how he is doing Recently widowed

## 2012-09-11 NOTE — Telephone Encounter (Signed)
rx sent to pharmacy by e-script Spoke with patient and advised results appt made

## 2012-09-11 NOTE — Telephone Encounter (Signed)
Pharmacy states pt is 2 weeks early, ok to authorize early refill?  .left message to have patient return my call.

## 2012-09-11 NOTE — Telephone Encounter (Signed)
Okay to authorize He might have lost it or forgotten he got it That is why I want to see him soon

## 2012-09-20 ENCOUNTER — Other Ambulatory Visit: Payer: Self-pay | Admitting: Internal Medicine

## 2012-10-04 ENCOUNTER — Ambulatory Visit (INDEPENDENT_AMBULATORY_CARE_PROVIDER_SITE_OTHER): Payer: Medicare Other | Admitting: Internal Medicine

## 2012-10-04 ENCOUNTER — Encounter: Payer: Self-pay | Admitting: Internal Medicine

## 2012-10-04 VITALS — BP 128/70 | HR 71 | Temp 97.7°F | Wt 152.0 lb

## 2012-10-04 DIAGNOSIS — M1712 Unilateral primary osteoarthritis, left knee: Secondary | ICD-10-CM

## 2012-10-04 DIAGNOSIS — I4891 Unspecified atrial fibrillation: Secondary | ICD-10-CM

## 2012-10-04 DIAGNOSIS — E1149 Type 2 diabetes mellitus with other diabetic neurological complication: Secondary | ICD-10-CM

## 2012-10-04 DIAGNOSIS — I10 Essential (primary) hypertension: Secondary | ICD-10-CM | POA: Diagnosis not present

## 2012-10-04 DIAGNOSIS — M1710 Unilateral primary osteoarthritis, unspecified knee: Secondary | ICD-10-CM | POA: Diagnosis not present

## 2012-10-04 NOTE — Assessment & Plan Note (Signed)
Good rate control ASA only due to fall in past with subdural hematoma

## 2012-10-04 NOTE — Patient Instructions (Signed)
Please call Casimiro Needle the fitness director to discuss a quadriceps strengthening program. [phone].

## 2012-10-04 NOTE — Assessment & Plan Note (Signed)
BP Readings from Last 3 Encounters:  10/04/12 128/70  06/28/12 140/80  06/14/12 132/70

## 2012-10-04 NOTE — Progress Notes (Signed)
Subjective:    Patient ID: Jimmy Brick., male    DOB: 27-Dec-1924, 79 y.o.   MRN: [id number]  HPI Knee is not much better after the cortisone injection Actually hurt more after the injection for 3 weeks He feels the pain is worst in quadriceps-- on left Still walks a mild every day--- has to use elastic bandage on it Gets sore but then fades away  Just took wife's ashes up with family-- to [place] in [place] Is adjusting to her loss Limited activities due to knee Gets out socially   Sugars are still fine  No chest pain No SOB No dizziness or syncope  Current Outpatient Prescriptions on File Prior to Visit  Medication Sig Dispense Refill  . acetaminophen (TYLENOL) 650 MG CR tablet Take 650 mg by mouth 3 (three) times daily.      Marland Kitchen aspirin EC 81 MG tablet Take 81 mg by mouth daily.      Marland Kitchen diltiazem (CARDIZEM CD) 120 MG 24 hr capsule Take 1 capsule by mouth  daily  90 capsule  3  . hydrochlorothiazide (HYDRODIURIL) 25 MG tablet Take 1 tablet by mouth  daily  90 tablet  3  . Lancets (ONETOUCH ULTRASOFT) lancets TEST as directed once daily  100 each  3  . lisinopril (PRINIVIL,ZESTRIL) 20 MG tablet Take 1 tablet by mouth  daily  90 tablet  3  . Multiple Vitamin (MULTIVITAMIN) tablet Take 1 tablet by mouth daily.        . ONE TOUCH ULTRA TEST test strip TEST as INSTRUCTED once daily  100 each  11  . rosuvastatin (CRESTOR) 5 MG tablet Take 1 tablet (5 mg total) by mouth every other day.  30 tablet  0  . traMADol (ULTRAM) 50 MG tablet take 1/2 to 1 tablet by mouth three times a day if needed for pain  60 tablet  0   No current facility-administered medications on file prior to visit.    No Known Allergies  Past Medical History  Diagnosis Date  . Atrial fibrillation   . Coronary artery disease   . Hypertension   . Hyperlipidemia   . Benign prostatic hypertrophy   . Lymphoma   . NIDDM (non-insulin dependent diabetes mellitus)   . Osteoarthritis   . Acute intra-cranial  hemorrhage     w/ coumadin  . Melanoma in situ of shoulder   . S/P tonsillectomy and adenoidectomy 1931  . Inguinal hernia     Past Surgical History  Procedure Laterality Date  . Tonsillectomy and adenoidectomy    . Cholecystectomy    . Cataract extraction      Family History  Problem Relation Age of Onset  . Prostate cancer Father   . Heart failure Mother   . Diabetes Mother   . Hypertension Mother   . Arthritis Sister     History   Social History  . Marital Status: Widowed    Spouse Name: N/A    Number of Children: 3  . Years of Education: N/A   Occupational History  . retired- Acupuncturist    Social History Main Topics  . Smoking status: Former Smoker -- 0.00 packs/day for 20 years    Quit date: 03/14/1980  . Smokeless tobacco: Never Used  . Alcohol Use: Yes  . Drug Use: No  . Sexually Active:    Other Topics Concern  . Not on file   Social History Narrative   Wife died 05-30-22   Review of Systems  Sleeps okay Appetite is good Weight is down a few pounds-- relates to trip up [place]     Objective:   Physical Exam  Constitutional: He appears well-developed and well-nourished. No distress.  Neck: Normal range of motion. Neck supple. No thyromegaly present.  Cardiovascular: Normal rate.  Exam reveals no gallop.   Irregular ?slight systolic murmur  Pulmonary/Chest: Effort normal and breath sounds normal. No respiratory distress. He has no wheezes. He has no rales.  Musculoskeletal: He exhibits no edema and no tenderness.  Lymphadenopathy:    He has no cervical adenopathy.  Psychiatric: He has a normal mood and affect. His behavior is normal.          Assessment & Plan:

## 2012-10-04 NOTE — Assessment & Plan Note (Signed)
No help with cortisone shot Still able to walk a mile daily Will have him start a quad exercise program

## 2012-10-04 NOTE — Assessment & Plan Note (Signed)
Lab Results  Component Value Date   HGBA1C 5.9 06/15/2012   Good control No changes needed

## 2012-10-06 ENCOUNTER — Other Ambulatory Visit: Payer: Self-pay | Admitting: Internal Medicine

## 2012-10-08 NOTE — Telephone Encounter (Signed)
rx called into pharmacy

## 2012-10-08 NOTE — Telephone Encounter (Signed)
Okay #60 x 0 

## 2012-10-16 DIAGNOSIS — M6281 Muscle weakness (generalized): Secondary | ICD-10-CM | POA: Diagnosis not present

## 2012-10-16 DIAGNOSIS — M25569 Pain in unspecified knee: Secondary | ICD-10-CM | POA: Diagnosis not present

## 2012-10-16 DIAGNOSIS — R262 Difficulty in walking, not elsewhere classified: Secondary | ICD-10-CM | POA: Diagnosis not present

## 2012-10-18 DIAGNOSIS — R262 Difficulty in walking, not elsewhere classified: Secondary | ICD-10-CM | POA: Diagnosis not present

## 2012-10-18 DIAGNOSIS — M25569 Pain in unspecified knee: Secondary | ICD-10-CM | POA: Diagnosis not present

## 2012-10-18 DIAGNOSIS — M6281 Muscle weakness (generalized): Secondary | ICD-10-CM | POA: Diagnosis not present

## 2012-10-19 DIAGNOSIS — M25569 Pain in unspecified knee: Secondary | ICD-10-CM | POA: Diagnosis not present

## 2012-10-19 DIAGNOSIS — R262 Difficulty in walking, not elsewhere classified: Secondary | ICD-10-CM | POA: Diagnosis not present

## 2012-10-19 DIAGNOSIS — M6281 Muscle weakness (generalized): Secondary | ICD-10-CM | POA: Diagnosis not present

## 2012-10-23 DIAGNOSIS — M6281 Muscle weakness (generalized): Secondary | ICD-10-CM | POA: Diagnosis not present

## 2012-10-23 DIAGNOSIS — R262 Difficulty in walking, not elsewhere classified: Secondary | ICD-10-CM | POA: Diagnosis not present

## 2012-10-23 DIAGNOSIS — M25569 Pain in unspecified knee: Secondary | ICD-10-CM | POA: Diagnosis not present

## 2012-10-25 DIAGNOSIS — M25569 Pain in unspecified knee: Secondary | ICD-10-CM | POA: Diagnosis not present

## 2012-10-25 DIAGNOSIS — R262 Difficulty in walking, not elsewhere classified: Secondary | ICD-10-CM | POA: Diagnosis not present

## 2012-10-25 DIAGNOSIS — M6281 Muscle weakness (generalized): Secondary | ICD-10-CM | POA: Diagnosis not present

## 2012-10-26 DIAGNOSIS — M6281 Muscle weakness (generalized): Secondary | ICD-10-CM | POA: Diagnosis not present

## 2012-10-26 DIAGNOSIS — R262 Difficulty in walking, not elsewhere classified: Secondary | ICD-10-CM | POA: Diagnosis not present

## 2012-10-26 DIAGNOSIS — M25569 Pain in unspecified knee: Secondary | ICD-10-CM | POA: Diagnosis not present

## 2012-10-30 DIAGNOSIS — M25569 Pain in unspecified knee: Secondary | ICD-10-CM | POA: Diagnosis not present

## 2012-10-30 DIAGNOSIS — M6281 Muscle weakness (generalized): Secondary | ICD-10-CM | POA: Diagnosis not present

## 2012-10-30 DIAGNOSIS — R262 Difficulty in walking, not elsewhere classified: Secondary | ICD-10-CM | POA: Diagnosis not present

## 2012-10-31 DIAGNOSIS — R262 Difficulty in walking, not elsewhere classified: Secondary | ICD-10-CM | POA: Diagnosis not present

## 2012-10-31 DIAGNOSIS — M25569 Pain in unspecified knee: Secondary | ICD-10-CM | POA: Diagnosis not present

## 2012-10-31 DIAGNOSIS — M6281 Muscle weakness (generalized): Secondary | ICD-10-CM | POA: Diagnosis not present

## 2012-11-02 DIAGNOSIS — M25569 Pain in unspecified knee: Secondary | ICD-10-CM | POA: Diagnosis not present

## 2012-11-02 DIAGNOSIS — M6281 Muscle weakness (generalized): Secondary | ICD-10-CM | POA: Diagnosis not present

## 2012-11-02 DIAGNOSIS — R262 Difficulty in walking, not elsewhere classified: Secondary | ICD-10-CM | POA: Diagnosis not present

## 2012-11-06 ENCOUNTER — Other Ambulatory Visit: Payer: Self-pay | Admitting: Internal Medicine

## 2012-11-07 DIAGNOSIS — M79609 Pain in unspecified limb: Secondary | ICD-10-CM | POA: Diagnosis not present

## 2012-11-07 DIAGNOSIS — B351 Tinea unguium: Secondary | ICD-10-CM | POA: Diagnosis not present

## 2012-12-14 DIAGNOSIS — Z23 Encounter for immunization: Secondary | ICD-10-CM | POA: Diagnosis not present

## 2013-01-02 DIAGNOSIS — R350 Frequency of micturition: Secondary | ICD-10-CM | POA: Insufficient documentation

## 2013-01-02 DIAGNOSIS — R339 Retention of urine, unspecified: Secondary | ICD-10-CM | POA: Insufficient documentation

## 2013-01-02 DIAGNOSIS — N138 Other obstructive and reflux uropathy: Secondary | ICD-10-CM | POA: Insufficient documentation

## 2013-01-02 DIAGNOSIS — R972 Elevated prostate specific antigen [PSA]: Secondary | ICD-10-CM | POA: Insufficient documentation

## 2013-01-02 DIAGNOSIS — N529 Male erectile dysfunction, unspecified: Secondary | ICD-10-CM | POA: Insufficient documentation

## 2013-01-09 ENCOUNTER — Encounter: Payer: Self-pay | Admitting: Podiatry

## 2013-01-09 ENCOUNTER — Ambulatory Visit (INDEPENDENT_AMBULATORY_CARE_PROVIDER_SITE_OTHER): Payer: Medicare Other | Admitting: Podiatry

## 2013-01-09 VITALS — BP 151/83 | HR 72 | Resp 16 | Ht 67.0 in | Wt 150.0 lb

## 2013-01-09 DIAGNOSIS — M79609 Pain in unspecified limb: Secondary | ICD-10-CM

## 2013-01-09 DIAGNOSIS — R339 Retention of urine, unspecified: Secondary | ICD-10-CM | POA: Diagnosis not present

## 2013-01-09 DIAGNOSIS — R350 Frequency of micturition: Secondary | ICD-10-CM | POA: Diagnosis not present

## 2013-01-09 DIAGNOSIS — R972 Elevated prostate specific antigen [PSA]: Secondary | ICD-10-CM | POA: Diagnosis not present

## 2013-01-09 DIAGNOSIS — B351 Tinea unguium: Secondary | ICD-10-CM

## 2013-01-09 DIAGNOSIS — N138 Other obstructive and reflux uropathy: Secondary | ICD-10-CM | POA: Diagnosis not present

## 2013-01-09 NOTE — Progress Notes (Signed)
Nollie presents today for chief complaint of painful thick tight nails bilaterally.  Objective: Pulses palpable bilateral. Thick yellow dystrophic clinically cut mycotic nails painful on palpation.  Assessment: Pain in limb secondary to onychomycosis 1 through 5 bilateral.  Plan: Debridement of nails 1 through 5 bilateral covered service secondary to pain. Followup with him in 3 months.

## 2013-01-18 ENCOUNTER — Other Ambulatory Visit: Payer: Self-pay | Admitting: Internal Medicine

## 2013-01-18 MED ORDER — ONETOUCH ULTRASOFT LANCETS MISC: Status: DC

## 2013-01-18 NOTE — Telephone Encounter (Signed)
rx sent to pharmacy by e-script  

## 2013-04-04 DIAGNOSIS — Z961 Presence of intraocular lens: Secondary | ICD-10-CM | POA: Diagnosis not present

## 2013-04-04 DIAGNOSIS — H524 Presbyopia: Secondary | ICD-10-CM | POA: Diagnosis not present

## 2013-04-04 DIAGNOSIS — E119 Type 2 diabetes mellitus without complications: Secondary | ICD-10-CM | POA: Diagnosis not present

## 2013-04-04 LAB — HM DIABETES EYE EXAM

## 2013-04-05 LAB — HM DIABETES EYE EXAM

## 2013-04-08 ENCOUNTER — Encounter: Payer: Self-pay | Admitting: Internal Medicine

## 2013-04-08 ENCOUNTER — Ambulatory Visit (INDEPENDENT_AMBULATORY_CARE_PROVIDER_SITE_OTHER): Payer: Medicare Other | Admitting: Internal Medicine

## 2013-04-08 VITALS — BP 128/80 | HR 67 | Temp 98.1°F | Wt 157.0 lb

## 2013-04-08 DIAGNOSIS — E785 Hyperlipidemia, unspecified: Secondary | ICD-10-CM | POA: Diagnosis not present

## 2013-04-08 DIAGNOSIS — E1149 Type 2 diabetes mellitus with other diabetic neurological complication: Secondary | ICD-10-CM | POA: Diagnosis not present

## 2013-04-08 DIAGNOSIS — I4891 Unspecified atrial fibrillation: Secondary | ICD-10-CM

## 2013-04-08 DIAGNOSIS — I10 Essential (primary) hypertension: Secondary | ICD-10-CM | POA: Diagnosis not present

## 2013-04-08 DIAGNOSIS — E1142 Type 2 diabetes mellitus with diabetic polyneuropathy: Secondary | ICD-10-CM | POA: Insufficient documentation

## 2013-04-08 LAB — HM DIABETES FOOT EXAM

## 2013-04-08 NOTE — Progress Notes (Signed)
Pre-visit discussion using our clinic review tool. No additional management support is needed unless otherwise documented below in the visit note.  

## 2013-04-08 NOTE — Assessment & Plan Note (Signed)
BP Readings from Last 3 Encounters:  04/08/13 128/80  01/09/13 151/83  10/04/12 128/70   Good control No changes needed

## 2013-04-08 NOTE — Patient Instructions (Signed)
Diabetes and Exercise Exercising regularly is important. It is not just about losing weight. It has many health benefits, such as:  Improving your overall fitness, flexibility, and endurance.  Increasing your bone density.  Helping with weight control.  Decreasing your body fat.  Increasing your muscle strength.  Reducing stress and tension.  Improving your overall health. People with diabetes who exercise gain additional benefits because exercise:  Reduces appetite.  Improves the body's use of blood sugar (glucose).  Helps lower or control blood glucose.  Decreases blood pressure.  Helps control blood lipids (such as cholesterol and triglycerides).  Improves the body's use of the hormone insulin by:  Increasing the body's insulin sensitivity.  Reducing the body's insulin needs.  Decreases the risk for heart disease because exercising:  Lowers cholesterol and triglycerides levels.  Increases the levels of good cholesterol (such as high-density lipoproteins [HDL]) in the body.  Lowers blood glucose levels. YOUR ACTIVITY PLAN  Choose an activity that you enjoy and set realistic goals. Your health care provider or diabetes educator can help you make an activity plan that works for you. You can break activities into 2 or 3 sessions throughout the day. Doing so is as good as one long session. Exercise ideas include:  Taking the dog for a walk.  Taking the stairs instead of the elevator.  Dancing to your favorite song.  Doing your favorite exercise with a friend. RECOMMENDATIONS FOR EXERCISING WITH TYPE 1 OR TYPE 2 DIABETES   Check your blood glucose before exercising. If blood glucose levels are greater than 240 mg/dL, check for urine ketones. Do not exercise if ketones are present.  Avoid injecting insulin into areas of the body that are going to be exercised. For example, avoid injecting insulin into:  The arms when playing tennis.  The legs when  jogging.  Keep a record of:  Food intake before and after you exercise.  Expected peak times of insulin action.  Blood glucose levels before and after you exercise.  The type and amount of exercise you have done.  Review your records with your health care provider. Your health care provider will help you to develop guidelines for adjusting food intake and insulin amounts before and after exercising.  If you take insulin or oral hypoglycemic agents, watch for signs and symptoms of hypoglycemia. They include:  Dizziness.  Shaking.  Sweating.  Chills.  Confusion.  Drink plenty of water while you exercise to prevent dehydration or heat stroke. Body water is lost during exercise and must be replaced.  Talk to your health care provider before starting an exercise program to make sure it is safe for you. Remember, almost any type of activity is better than none. Document Released: 05/21/2003 Document Revised: 10/31/2012 Document Reviewed: 08/07/2012 Exit[employer] Patient Information 2014 [employer]. Diabetes Meal Planning Guide The diabetes meal planning guide is a tool to help you plan your meals and snacks. It is important for people with diabetes to manage their blood glucose (sugar) levels. Choosing the right foods and the right amounts throughout your day will help control your blood glucose. Eating right can even help you improve your blood pressure and reach or maintain a healthy weight. CARBOHYDRATE COUNTING MADE EASY When you eat carbohydrates, they turn to sugar. This raises your blood glucose level. Counting carbohydrates can help you control this level so you feel better. When you plan your meals by counting carbohydrates, you can have more flexibility in what you eat and balance your medicine   with your food intake. Carbohydrate counting simply means adding up the total amount of carbohydrate grams in your meals and snacks. Try to eat about the same amount at each meal. Foods  with carbohydrates are listed below. Each portion below is 1 carbohydrate serving or 15 grams of carbohydrates. Ask your dietician how many grams of carbohydrates you should eat at each meal or snack. Grains and Starches  1 slice bread.   English muffin or hotdog/hamburger bun.   cup cold cereal (unsweetened).   cup cooked pasta or rice.   cup starchy vegetables (corn, potatoes, peas, beans, winter squash).  1 tortilla (6 inches).   bagel.  1 waffle or pancake (size of a CD).   cup cooked cereal.  4 to 6 small crackers. *Whole grain is recommended. Fruit  1 cup fresh unsweetened berries, melon, papaya, pineapple.  1 small fresh fruit.   banana or mango.   cup fruit juice (4 oz unsweetened).   cup canned fruit in natural juice or water.  2 tbs dried fruit.  12 to 15 grapes or cherries. Milk and Yogurt  1 cup fat-free or 1% milk.  1 cup soy milk.  6 oz light yogurt with sugar-free sweetener.  6 oz low-fat soy yogurt.  6 oz plain yogurt. Vegetables  1 cup raw or  cup cooked is counted as 0 carbohydrates or a "free" food.  If you eat 3 or more servings at 1 meal, count them as 1 carbohydrate serving. Other Carbohydrates   oz chips or pretzels.   cup ice cream or frozen yogurt.   cup sherbet or sorbet.  2 inch square cake, no frosting.  1 tbs honey, sugar, jam, jelly, or syrup.  2 small cookies.  3 squares of graham crackers.  3 cups popcorn.  6 crackers.  1 cup broth-based soup.  Count 1 cup casserole or other mixed foods as 2 carbohydrate servings.  Foods with less than 20 calories in a serving may be counted as 0 carbohydrates or a "free" food. You may want to purchase a book or computer software that lists the carbohydrate gram counts of different foods. In addition, the nutrition facts panel on the labels of the foods you eat are a good source of this information. The label will tell you how big the serving size is and the  total number of carbohydrate grams you will be eating per serving. Divide this number by 15 to obtain the number of carbohydrate servings in a portion. Remember, 1 carbohydrate serving equals 15 grams of carbohydrate. SERVING SIZES Measuring foods and serving sizes helps you make sure you are getting the right amount of food. The list below tells how big or small some common serving sizes are.  1 oz.........4 stacked dice.  3 oz.........Deck of cards.  1 tsp........Tip of little finger.  1 tbs........Thumb.  2 tbs........Golf ball.   cup.......Half of a fist.  1 cup........A fist. SAMPLE DIABETES MEAL PLAN Below is a sample meal plan that includes foods from the grain and starches, dairy, vegetable, fruit, and meat groups. A dietician can individualize a meal plan to fit your calorie needs and tell you the number of servings needed from each food group. However, controlling the total amount of carbohydrates in your meal or snack is more important than making sure you include all of the food groups at every meal. You may interchange carbohydrate containing foods (dairy, starches, and fruits). The meal plan below is an example of a 2000 calorie diet   using carbohydrate counting. This meal plan has 17 carbohydrate servings. Breakfast  1 cup oatmeal (2 carb servings).   cup light yogurt (1 carb serving).  1 cup blueberries (1 carb serving).   cup almonds. Snack  1 large apple (2 carb servings).  1 low-fat string cheese stick. Lunch  Chicken breast salad.  1 cup spinach.   cup chopped tomatoes.  2 oz chicken breast, sliced.  2 tbs low-fat Italian dressing.  12 whole-wheat crackers (2 carb servings).  12 to 15 grapes (1 carb serving).  1 cup low-fat milk (1 carb serving). Snack  1 cup carrots.   cup hummus (1 carb serving). Dinner  3 oz broiled salmon.  1 cup brown rice (3 carb servings). Snack  1  cups steamed broccoli (1 carb serving) drizzled with 1  tsp olive oil and lemon juice.  1 cup light pudding (2 carb servings). DIABETES MEAL PLANNING WORKSHEET Your dietician can use this worksheet to help you decide how many servings of foods and what types of foods are right for you.  BREAKFAST Food Group and Servings / Carb Servings Grain/Starches __________________________________ Dairy __________________________________________ Vegetable ______________________________________ Fruit ___________________________________________ Meat __________________________________________ Fat ____________________________________________ LUNCH Food Group and Servings / Carb Servings Grain/Starches ___________________________________ Dairy ___________________________________________ Fruit ____________________________________________ Meat ___________________________________________ Fat _____________________________________________ DINNER Food Group and Servings / Carb Servings Grain/Starches ___________________________________ Dairy ___________________________________________ Fruit ____________________________________________ Meat ___________________________________________ Fat _____________________________________________ SNACKS Food Group and Servings / Carb Servings Grain/Starches ___________________________________ Dairy ___________________________________________ Vegetable _______________________________________ Fruit ____________________________________________ Meat ___________________________________________ Fat _____________________________________________ DAILY TOTALS Starches _________________________ Vegetable ________________________ Fruit ____________________________ Dairy ____________________________ Meat ____________________________ Fat ______________________________ Document Released: 11/25/2004 Document Revised: 05/23/2011 Document Reviewed: 10/06/2008 [employer] Patient Information 2014 [employer].  

## 2013-04-08 NOTE — Assessment & Plan Note (Signed)
No meds Will recheck control

## 2013-04-08 NOTE — Assessment & Plan Note (Signed)
Rate is controlled No coumadin due to past ICH

## 2013-04-08 NOTE — Progress Notes (Signed)
Subjective:    Patient ID: Jimmy Riggs., male    DOB: 12/15/1924, 80 y.o.   MRN: [id number]  HPI Here for follow up Recent eye exam was fine  Left knee pain continues Uses elastic brace Gets around without a lot of pain No heavy activities  Does go out with family--- on weekends Not much activities around [facility] Did take some fitness classes in past--not recently He prefers taking it easy  Checks sugars daily Usually below 140 and not above 150 Recent eye exam No sores or pain in feet  No urinary problems Nocturia x 2-3 No daytime urgency or increased frequency  No heart problems No chest pain No SOB No palpitations  Current Outpatient Prescriptions on File Prior to Visit  Medication Sig Dispense Refill  . aspirin EC 81 MG tablet Take 81 mg by mouth daily.      . CRESTOR 5 MG tablet Take 1 tablet by mouth  every other day  45 tablet  3  . diltiazem (CARDIZEM CD) 120 MG 24 hr capsule Take 1 capsule by mouth  daily  90 capsule  3  . hydrochlorothiazide (HYDRODIURIL) 25 MG tablet Take 1 tablet by mouth  daily  90 tablet  3  . Lancets (ONETOUCH ULTRASOFT) lancets Use as instructed to test blood sugar once daily dx: 250.60  100 each  3  . lisinopril (PRINIVIL,ZESTRIL) 20 MG tablet Take 1 tablet by mouth  daily  90 tablet  3  . Multiple Vitamin (MULTIVITAMIN) tablet Take 1 tablet by mouth daily.        . ONE TOUCH ULTRA TEST test strip TEST as INSTRUCTED once daily  100 each  11   No current facility-administered medications on file prior to visit.    No Known Allergies  Past Medical History  Diagnosis Date  . Atrial fibrillation   . Coronary artery disease   . Hypertension   . Hyperlipidemia   . Benign prostatic hypertrophy   . Lymphoma   . NIDDM (non-insulin dependent diabetes mellitus)   . Osteoarthritis   . Acute intra-cranial hemorrhage     w/ coumadin  . Melanoma in situ of shoulder   . S/P tonsillectomy and adenoidectomy 1931  . Inguinal hernia      Past Surgical History  Procedure Laterality Date  . Tonsillectomy and adenoidectomy    . Cholecystectomy    . Cataract extraction      Family History  Problem Relation Age of Onset  . Prostate cancer Father   . Heart failure Mother   . Diabetes Mother   . Hypertension Mother   . Arthritis Sister     History   Social History  . Marital Status: Widowed    Spouse Name: N/A    Number of Children: 3  . Years of Education: N/A   Occupational History  . retired- Art gallery manager    Social History Main Topics  . Smoking status: Former Smoker -- 0.00 packs/day for 20 years    Quit date: 03/14/1980  . Smokeless tobacco: Never Used  . Alcohol Use: Yes  . Drug Use: No  . Sexual Activity: Not on file   Other Topics Concern  . Not on file   Social History Narrative   Wife died 06-04-22   Review of Systems Has had a recurrence of RIH. He is able to reduce it easily. No pain Not particularly careful with diet Weight is up 7#    Objective:   Physical Exam  Constitutional: He appears well-developed and well-nourished. No distress.  Neck: Normal range of motion. Neck supple. No thyromegaly present.  Cardiovascular: Normal rate.  Exam reveals no gallop.   No murmur heard. Irregular 1+ pulse right foot, very faint on left  Pulmonary/Chest: Effort normal and breath sounds normal. No respiratory distress. He has no wheezes. He has no rales.  Abdominal: Soft. There is no tenderness.  Large reducible right inguinal hernia  Musculoskeletal:  Trace edema in left ankle only  Lymphadenopathy:    He has no cervical adenopathy.  Skin:  Mycotic toenails Dry, scaly areas on plantar feet No ulcers  Psychiatric: He has a normal mood and affect. His behavior is normal.          Assessment & Plan:

## 2013-04-08 NOTE — Assessment & Plan Note (Signed)
No problems with med.

## 2013-04-08 NOTE — Assessment & Plan Note (Signed)
Mild sensory changes without sig pain

## 2013-04-09 ENCOUNTER — Encounter: Payer: Self-pay | Admitting: *Deleted

## 2013-04-09 LAB — LIPID PANEL
CHOLESTEROL: 134 mg/dL (ref 0–200)
HDL: 44.6 mg/dL (ref >39.00)
LDL CALC: 63 mg/dL (ref 0–99)
TRIGLYCERIDES: 134 mg/dL (ref 0.0–149.0)
Total CHOL/HDL Ratio: 3
VLDL: 26.8 mg/dL (ref 0.0–40.0)

## 2013-04-09 LAB — COMPREHENSIVE METABOLIC PANEL
ALK PHOS: 37 U/L — AB (ref 39–117)
ALT: 16 U/L (ref 0–53)
AST: 22 U/L (ref 0–37)
Albumin: 4.6 g/dL (ref 3.5–5.2)
BILIRUBIN TOTAL: 1 mg/dL (ref 0.3–1.2)
BUN: 9 mg/dL (ref 6–23)
CO2: 29 mEq/L (ref 19–32)
CREATININE: 0.8 mg/dL (ref 0.4–1.5)
Calcium: 9.3 mg/dL (ref 8.4–10.5)
Chloride: 96 mEq/L (ref 96–112)
GFR: 100.08 mL/min (ref >60.00)
GLUCOSE: 105 mg/dL — AB (ref 70–99)
Potassium: 3.9 mEq/L (ref 3.5–5.1)
Sodium: 132 mEq/L — ABNORMAL LOW (ref 135–145)
Total Protein: 7.1 g/dL (ref 6.0–8.3)

## 2013-04-09 LAB — HEMOGLOBIN A1C: Hgb A1c MFr Bld: 6 % (ref 4.6–6.5)

## 2013-04-09 LAB — TSH: TSH: 1.78 u[IU]/mL (ref 0.35–5.50)

## 2013-04-10 ENCOUNTER — Ambulatory Visit (INDEPENDENT_AMBULATORY_CARE_PROVIDER_SITE_OTHER): Payer: Medicare Other | Admitting: Podiatry

## 2013-04-10 ENCOUNTER — Encounter: Payer: Self-pay | Admitting: Podiatry

## 2013-04-10 ENCOUNTER — Telehealth: Payer: Self-pay

## 2013-04-10 VITALS — BP 140/72 | HR 70 | Resp 18

## 2013-04-10 DIAGNOSIS — M79609 Pain in unspecified limb: Secondary | ICD-10-CM

## 2013-04-10 DIAGNOSIS — B351 Tinea unguium: Secondary | ICD-10-CM | POA: Diagnosis not present

## 2013-04-10 NOTE — Progress Notes (Signed)
Jimmy Riggs presents today with a chief complaint of painful elongated toenails.  Objective: Vital signs are stable he is alert and oriented x3. Pulses are palpable bilateral. Nails are thick yellow dystrophic clinically mycotic and painful palpation.  Assessment: Pain in limb secondary to onychomycosis 1-5 bilateral.  Plan: Debridement of nails 1 through 5 bilateral is cover service associated with pain.

## 2013-04-10 NOTE — Telephone Encounter (Signed)
Relevant patient education mailed to patient.  

## 2013-04-17 ENCOUNTER — Telehealth: Payer: Self-pay | Admitting: Internal Medicine

## 2013-04-17 NOTE — Telephone Encounter (Signed)
Relevant patient education mailed to patient.  

## 2013-04-23 ENCOUNTER — Telehealth: Payer: Self-pay

## 2013-04-23 NOTE — Telephone Encounter (Signed)
Pt left v/m requesting lancets and test strips sent to [place] with [facility] and pharmacist will fax [facility] forms for completion. Pt request cb when done.

## 2013-04-24 MED ORDER — ONETOUCH ULTRASOFT LANCETS MISC: Status: DC

## 2013-04-24 MED ORDER — GLUCOSE BLOOD VI STRP: ORAL_STRIP | Status: DC

## 2013-04-24 NOTE — Telephone Encounter (Signed)
rx sent to pharmacy by e-script  

## 2013-05-14 ENCOUNTER — Ambulatory Visit (INDEPENDENT_AMBULATORY_CARE_PROVIDER_SITE_OTHER): Payer: Medicare Other | Admitting: Internal Medicine

## 2013-05-14 ENCOUNTER — Encounter: Payer: Self-pay | Admitting: Internal Medicine

## 2013-05-14 VITALS — BP 140/78 | HR 90 | Temp 98.6°F | Wt 160.0 lb

## 2013-05-14 DIAGNOSIS — L0231 Cutaneous abscess of buttock: Secondary | ICD-10-CM | POA: Diagnosis not present

## 2013-05-14 DIAGNOSIS — L03317 Cellulitis of buttock: Secondary | ICD-10-CM | POA: Diagnosis not present

## 2013-05-14 MED ORDER — CEPHALEXIN 500 MG PO CAPS: 500.0000 mg | ORAL_CAPSULE | Freq: Three times a day (TID) | ORAL | Status: DC

## 2013-05-14 MED ORDER — TRIAMCINOLONE ACETONIDE 0.1 % EX CREA: 1.0000 "application " | TOPICAL_CREAM | Freq: Two times a day (BID) | CUTANEOUS | Status: DC | PRN

## 2013-05-14 NOTE — Progress Notes (Signed)
Subjective:    Patient ID: Jimmy Riggs., male    DOB: Mar 09, 1925, 80 y.o.   MRN: [id number]  HPI Has a rash on bump on buttock Coming on for over 2 years Now worse--gives him pain when sitting on it Itching and some pain  No fever Not sick  Current Outpatient Prescriptions on File Prior to Visit  Medication Sig Dispense Refill  . aspirin EC 81 MG tablet Take 81 mg by mouth daily.      . CRESTOR 5 MG tablet Take 1 tablet by mouth  every other day  45 tablet  3  . diltiazem (CARDIZEM CD) 120 MG 24 hr capsule Take 1 capsule by mouth  daily  90 capsule  3  . glucose blood (ONE TOUCH ULTRA TEST) test strip Use as instructed to test blood sugar once daily dx: 250.60  100 each  3  . hydrochlorothiazide (HYDRODIURIL) 25 MG tablet Take 1 tablet by mouth  daily  90 tablet  3  . Lancets (ONETOUCH ULTRASOFT) lancets Use as instructed to test blood sugar once daily dx: 250.60  100 each  3  . lisinopril (PRINIVIL,ZESTRIL) 20 MG tablet Take 1 tablet by mouth  daily  90 tablet  3  . Multiple Vitamin (MULTIVITAMIN) tablet Take 1 tablet by mouth daily.         No current facility-administered medications on file prior to visit.    No Known Allergies  Past Medical History  Diagnosis Date  . Atrial fibrillation   . Coronary artery disease   . Hypertension   . Hyperlipidemia   . Benign prostatic hypertrophy   . Lymphoma   . NIDDM (non-insulin dependent diabetes mellitus)   . Osteoarthritis   . Acute intra-cranial hemorrhage     w/ coumadin  . Melanoma in situ of shoulder   . S/P tonsillectomy and adenoidectomy 1931  . Inguinal hernia     Past Surgical History  Procedure Laterality Date  . Tonsillectomy and adenoidectomy    . Cholecystectomy    . Cataract extraction      Family History  Problem Relation Age of Onset  . Prostate cancer Father   . Heart failure Mother   . Diabetes Mother   . Hypertension Mother   . Arthritis Sister     History   Social History  .  Marital Status: Widowed    Spouse Name: N/A    Number of Children: 3  . Years of Education: N/A   Occupational History  . retired- Art gallery manager    Social History Main Topics  . Smoking status: Former Smoker -- 0.00 packs/day for 20 years    Quit date: 03/14/1980  . Smokeless tobacco: Never Used  . Alcohol Use: Yes  . Drug Use: No  . Sexual Activity: Not on file   Other Topics Concern  . Not on file   Social History Narrative   Wife died 06/12/2022      Has living will   Son Eddie Dibbles is health care POA   Has DNR order   No tube feedings if cognitively unaware   Review of Systems Bowels okay No voiding problems     Objective:   Physical Exam  Constitutional: He appears well-developed and well-nourished. No distress.  Skin:  Flaking and redness over pilonidal area. Not warm Area of flaking and redness on bottom of left buttock--with wide area of induration underlying it          Assessment & Plan:

## 2013-05-14 NOTE — Patient Instructions (Signed)
Please try sitz baths, the antibiotic and the cream. If that hard area is not improving by next week, please set up an appointment with Dr Tyler Deis.  Sitz Bath A sitz bath is a warm water bath taken in the sitting position that covers only the hips and buttocks. It may be used for either healing or hygiene purposes. Sitz baths are also used to relieve pain, itching, or muscle spasms. The water may contain medicine. Moist heat will help you heal and relax.  HOME CARE INSTRUCTIONS  Take 3 to 4 sitz baths a day. 1. Fill the bathtub half full with warm water. 2. Sit in the water and open the drain a little. 3. Turn on the warm water to keep the tub half full. Keep the water running constantly. 4. Soak in the water for 15 to 20 minutes. 5. After the sitz bath, pat the affected area dry first. SEEK MEDICAL CARE IF:  You get worse instead of better. Stop the sitz baths if you get worse. MAKE SURE YOU:  Understand these instructions.  Will watch your condition.  Will get help right away if you are not doing well or get worse. Document Released: 11/21/2003 Document Revised: 11/23/2011 Document Reviewed: 05/28/2010 [facility] Patient Information 2014 [place], [place].

## 2013-05-14 NOTE — Assessment & Plan Note (Signed)
Has chronic ?eczematous areas but now with warmth and large area of induration Will have him try sitz baths, TAC for the itching rash Cephalexin for the apparent low grade infection

## 2013-05-14 NOTE — Progress Notes (Signed)
Pre visit review using our clinic review tool, if applicable. No additional management support is needed unless otherwise documented below in the visit note. 

## 2013-06-12 DIAGNOSIS — R21 Rash and other nonspecific skin eruption: Secondary | ICD-10-CM | POA: Diagnosis not present

## 2013-06-12 DIAGNOSIS — L280 Lichen simplex chronicus: Secondary | ICD-10-CM | POA: Diagnosis not present

## 2013-06-26 ENCOUNTER — Encounter: Payer: Self-pay | Admitting: Internal Medicine

## 2013-07-10 ENCOUNTER — Ambulatory Visit (INDEPENDENT_AMBULATORY_CARE_PROVIDER_SITE_OTHER): Payer: Medicare Other | Admitting: Podiatry

## 2013-07-10 VITALS — Resp 16 | Ht 67.0 in | Wt 150.0 lb

## 2013-07-10 DIAGNOSIS — B351 Tinea unguium: Secondary | ICD-10-CM

## 2013-07-10 DIAGNOSIS — M79609 Pain in unspecified limb: Secondary | ICD-10-CM | POA: Diagnosis not present

## 2013-07-10 NOTE — Progress Notes (Signed)
He presents today chief complaint of painful elongated toenails.  Objective: Vital signs are stable he is alert and oriented x3. His nails are thick yellow dystrophic with mycotic painful palpation.  Assessment: Pain in limb secondary to onychomycosis 1 through 5 bilateral.  Plan: Debridement of nails 1 through 5 bilateral covered service secondary to pain. He wanted to shorten his time from 3 months to 2 months because he says his nails are growing thick and long and painful I expressed to him that 2 months may be too short but he did not care he did not therapy had a covered service at all.

## 2013-07-17 ENCOUNTER — Telehealth: Payer: Self-pay

## 2013-07-17 NOTE — Telephone Encounter (Signed)
Pt left v/m;pt needs refills on some of his meds and wants to use mail order pharmacy. Pt request cb. Left v/m for pt to cb. Pt did not leave name of meds needing refill or name of pharmacy.

## 2013-07-18 ENCOUNTER — Other Ambulatory Visit: Payer: Self-pay

## 2013-07-18 MED ORDER — HYDROCHLOROTHIAZIDE 25 MG PO TABS: ORAL_TABLET | ORAL | Status: DC

## 2013-07-18 MED ORDER — DILTIAZEM HCL ER COATED BEADS 120 MG PO CP24: ORAL_CAPSULE | ORAL | Status: DC

## 2013-07-18 MED ORDER — LISINOPRIL 20 MG PO TABS: ORAL_TABLET | ORAL | Status: DC

## 2013-07-18 NOTE — Telephone Encounter (Signed)
Pt called back and refills done; pt notified done and will update further refills at 09/2013 appt.

## 2013-07-18 NOTE — Telephone Encounter (Signed)
error 

## 2013-08-02 DIAGNOSIS — Z85828 Personal history of other malignant neoplasm of skin: Secondary | ICD-10-CM | POA: Diagnosis not present

## 2013-08-02 DIAGNOSIS — Z85820 Personal history of malignant melanoma of skin: Secondary | ICD-10-CM | POA: Diagnosis not present

## 2013-08-02 DIAGNOSIS — L738 Other specified follicular disorders: Secondary | ICD-10-CM | POA: Diagnosis not present

## 2013-08-02 DIAGNOSIS — L259 Unspecified contact dermatitis, unspecified cause: Secondary | ICD-10-CM | POA: Diagnosis not present

## 2013-08-02 DIAGNOSIS — L821 Other seborrheic keratosis: Secondary | ICD-10-CM | POA: Diagnosis not present

## 2013-09-11 ENCOUNTER — Encounter: Payer: Self-pay | Admitting: Podiatry

## 2013-09-11 ENCOUNTER — Ambulatory Visit (INDEPENDENT_AMBULATORY_CARE_PROVIDER_SITE_OTHER): Payer: Medicare Other | Admitting: Podiatry

## 2013-09-11 DIAGNOSIS — M79609 Pain in unspecified limb: Secondary | ICD-10-CM | POA: Diagnosis not present

## 2013-09-11 DIAGNOSIS — M79676 Pain in unspecified toe(s): Secondary | ICD-10-CM

## 2013-09-11 DIAGNOSIS — B351 Tinea unguium: Secondary | ICD-10-CM | POA: Diagnosis not present

## 2013-09-11 NOTE — Progress Notes (Signed)
He presents today with a chief complaint of painful elongated toenails one through 5 bilateral. ° °Objective: Nails are thick yellow dystrophic with mycotic and painful palpation. ° °Assessment: Pain in limb secondary to onychomycosis 1 through 5 bilateral. ° °Plan: Debridement of nails thickness and length as cover service secondary to pain. °

## 2013-09-17 ENCOUNTER — Ambulatory Visit: Payer: Medicare Other | Admitting: Internal Medicine

## 2013-09-20 ENCOUNTER — Telehealth: Payer: Self-pay

## 2013-09-20 ENCOUNTER — Ambulatory Visit (INDEPENDENT_AMBULATORY_CARE_PROVIDER_SITE_OTHER): Payer: Medicare Other | Admitting: Internal Medicine

## 2013-09-20 ENCOUNTER — Other Ambulatory Visit: Payer: Self-pay | Admitting: Internal Medicine

## 2013-09-20 ENCOUNTER — Encounter: Payer: Self-pay | Admitting: Internal Medicine

## 2013-09-20 VITALS — BP 132/80 | HR 68 | Temp 98.1°F | Wt 155.0 lb

## 2013-09-20 DIAGNOSIS — I2510 Atherosclerotic heart disease of native coronary artery without angina pectoris: Secondary | ICD-10-CM

## 2013-09-20 DIAGNOSIS — E1149 Type 2 diabetes mellitus with other diabetic neurological complication: Secondary | ICD-10-CM

## 2013-09-20 DIAGNOSIS — E1142 Type 2 diabetes mellitus with diabetic polyneuropathy: Secondary | ICD-10-CM

## 2013-09-20 DIAGNOSIS — I10 Essential (primary) hypertension: Secondary | ICD-10-CM

## 2013-09-20 DIAGNOSIS — I482 Chronic atrial fibrillation, unspecified: Secondary | ICD-10-CM

## 2013-09-20 DIAGNOSIS — I4891 Unspecified atrial fibrillation: Secondary | ICD-10-CM | POA: Diagnosis not present

## 2013-09-20 LAB — COMPREHENSIVE METABOLIC PANEL
ALK PHOS: 40 U/L (ref 39–117)
ALT: 13 U/L (ref 0–53)
AST: 20 U/L (ref 0–37)
Albumin: 4.8 g/dL (ref 3.5–5.2)
BILIRUBIN TOTAL: 1.1 mg/dL (ref 0.2–1.2)
BUN: 7 mg/dL (ref 6–23)
CO2: 28 meq/L (ref 19–32)
CREATININE: 0.6 mg/dL (ref 0.4–1.5)
Calcium: 9.9 mg/dL (ref 8.4–10.5)
Chloride: 91 mEq/L — ABNORMAL LOW (ref 96–112)
GFR: 125.62 mL/min (ref >60.00)
GLUCOSE: 94 mg/dL (ref 70–99)
Potassium: 4.5 mEq/L (ref 3.5–5.1)
SODIUM: 129 meq/L — AB (ref 135–145)
Total Protein: 7.4 g/dL (ref 6.0–8.3)

## 2013-09-20 LAB — LIPID PANEL
CHOL/HDL RATIO: 3
Cholesterol: 129 mg/dL (ref 0–200)
HDL: 49.7 mg/dL (ref >39.00)
LDL Cholesterol: 63 mg/dL (ref 0–99)
NONHDL: 79.3
Triglycerides: 80 mg/dL (ref 0.0–149.0)
VLDL: 16 mg/dL (ref 0.0–40.0)

## 2013-09-20 LAB — CBC WITH DIFFERENTIAL/PLATELET
Basophils Absolute: 0 10*3/uL (ref 0.0–0.1)
Basophils Relative: 0.4 % (ref 0.0–3.0)
EOS PCT: 2.6 % (ref 0.0–5.0)
Eosinophils Absolute: 0.1 10*3/uL (ref 0.0–0.7)
HEMATOCRIT: 43 % (ref 39.0–52.0)
Hemoglobin: 14.5 g/dL (ref 13.0–17.0)
LYMPHS ABS: 1.7 10*3/uL (ref 0.7–4.0)
Lymphocytes Relative: 35.4 % (ref 12.0–46.0)
MCHC: 33.6 g/dL (ref 30.0–36.0)
MCV: 95.4 fl (ref 78.0–100.0)
MONO ABS: 0.8 10*3/uL (ref 0.1–1.0)
MONOS PCT: 17 % — AB (ref 3.0–12.0)
NEUTROS ABS: 2.2 10*3/uL (ref 1.4–7.7)
Neutrophils Relative %: 44.6 % (ref 43.0–77.0)
Platelets: 150 10*3/uL (ref 150.0–400.0)
RBC: 4.51 Mil/uL (ref 4.22–5.81)
RDW: 13.6 % (ref 11.5–15.5)
WBC: 4.9 10*3/uL (ref 4.0–10.5)

## 2013-09-20 LAB — T4, FREE: Free T4: 0.75 ng/dL (ref 0.60–1.60)

## 2013-09-20 LAB — HEMOGLOBIN A1C: Hgb A1c MFr Bld: 6 % (ref 4.6–6.5)

## 2013-09-20 MED ORDER — TRIAMCINOLONE ACETONIDE 0.1 % EX CREA: 1.0000 "application " | TOPICAL_CREAM | Freq: Two times a day (BID) | CUTANEOUS | Status: DC | PRN

## 2013-09-20 MED ORDER — RIVAROXABAN 20 MG PO TABS: 20.0000 mg | ORAL_TABLET | Freq: Every day | ORAL | Status: DC

## 2013-09-20 NOTE — Assessment & Plan Note (Signed)
Mild No meds needed

## 2013-09-20 NOTE — Assessment & Plan Note (Signed)
Seems to have good control. 

## 2013-09-20 NOTE — Progress Notes (Signed)
Subjective:    Patient ID: Jimmy Riggs., male    DOB: 08/21/1924, 80 y.o.   MRN: [id number]  HPI Doing fine Cellulitis resolved   Has rash below both knees--?from support hose Itchy at times  Checks sugars every morning--usually around 100 No hypoglycemic problems--no meds No foot sores--mild decreased sensation  No chest pain No palpitations History of paroxysmal atrial fib--he has no symptoms.  ASA only due to concerns for bleeding risk No edema No syncope--will have mild orthostatic dizziness (but doesn't have to sit back down)  Still tries to exercise Knee limits him somewhat--no meds for this  Current Outpatient Prescriptions on File Prior to Visit  Medication Sig Dispense Refill  . aspirin EC 81 MG tablet Take 81 mg by mouth daily.      . CRESTOR 5 MG tablet Take 1 tablet by mouth  every other day  45 tablet  3  . diltiazem (CARDIZEM CD) 120 MG 24 hr capsule Take 1 capsule by mouth  daily  90 capsule  0  . glucose blood (ONE TOUCH ULTRA TEST) test strip Use as instructed to test blood sugar once daily dx: 250.60  100 each  3  . hydrochlorothiazide (HYDRODIURIL) 25 MG tablet Take 1 tablet by mouth  daily  90 tablet  0  . Lancets (ONETOUCH ULTRASOFT) lancets Use as instructed to test blood sugar once daily dx: 250.60  100 each  3  . lisinopril (PRINIVIL,ZESTRIL) 20 MG tablet Take 1 tablet by mouth  daily  90 tablet  0  . Multiple Vitamin (MULTIVITAMIN) tablet Take 1 tablet by mouth daily.         No current facility-administered medications on file prior to visit.    No Known Allergies  Past Medical History  Diagnosis Date  . Atrial fibrillation   . Coronary artery disease   . Hypertension   . Hyperlipidemia   . Benign prostatic hypertrophy   . Lymphoma   . NIDDM (non-insulin dependent diabetes mellitus)   . Osteoarthritis   . Acute intra-cranial hemorrhage     w/ coumadin  . Melanoma in situ of shoulder   . S/P tonsillectomy and adenoidectomy 1931  .  Inguinal hernia     Past Surgical History  Procedure Laterality Date  . Tonsillectomy and adenoidectomy    . Cholecystectomy    . Cataract extraction      Family History  Problem Relation Age of Onset  . Prostate cancer Father   . Heart failure Mother   . Diabetes Mother   . Hypertension Mother   . Arthritis Sister     History   Social History  . Marital Status: Widowed    Spouse Name: N/A    Number of Children: 3  . Years of Education: N/A   Occupational History  . retired- Art gallery manager    Social History Main Topics  . Smoking status: Former Smoker -- 0.00 packs/day for 20 years    Quit date: 03/14/1980  . Smokeless tobacco: Never Used  . Alcohol Use: Yes  . Drug Use: No  . Sexual Activity: Not on file   Other Topics Concern  . Not on file   Social History Narrative   Wife died 05-29-2022      Has living will   Son Eddie Dibbles is health care POA   Has DNR order   No tube feedings if cognitively unaware   Review of Systems Sleeps well Appetite is okay Did gain back 5# of what  he lost Voids okay-- stable nocturia every 2-3 hours. Some daytime frequency     Objective:   Physical Exam  Constitutional: He appears well-developed and well-nourished. No distress.  Neck: Normal range of motion.  Cardiovascular: Normal rate, normal heart sounds and intact distal pulses.  Exam reveals no gallop.   No murmur heard. irregular  Pulmonary/Chest: Effort normal and breath sounds normal. No respiratory distress. He has no wheezes. He has no rales.  Abdominal: Soft. He exhibits no distension. There is no tenderness. There is no rebound and no guarding.  Musculoskeletal:  Trace edema  Lymphadenopathy:    He has no cervical adenopathy.  Skin:  Raised dermatitis below knees--Rx given  Psychiatric: He has a normal mood and affect. His behavior is normal.          Assessment & Plan:

## 2013-09-20 NOTE — Telephone Encounter (Signed)
Pt left v/m; pt was seen earlier today and went to pick up meds and was told one med was not ready due to more information needed for ins co. ? Prior auth. Pt request cb. Unable to reach pt to find out name of med.Please advise.

## 2013-09-20 NOTE — Assessment & Plan Note (Signed)
BP Readings from Last 3 Encounters:  09/20/13 132/80  05/14/13 140/78  04/10/13 140/72   Good control

## 2013-09-20 NOTE — Assessment & Plan Note (Addendum)
He is vague about this history Now seems to be in persistent a fib Rate is fine Will start xarelto daily Stop aspirin

## 2013-09-20 NOTE — Assessment & Plan Note (Signed)
Has been quiet 

## 2013-09-20 NOTE — Progress Notes (Signed)
Pre visit review using our clinic review tool, if applicable. No additional management support is needed unless otherwise documented below in the visit note. 

## 2013-09-23 ENCOUNTER — Encounter: Payer: Self-pay | Admitting: *Deleted

## 2013-09-23 NOTE — Telephone Encounter (Signed)
Left message on machine that xarelto has been approved

## 2013-09-23 NOTE — Telephone Encounter (Signed)
From done Please fax

## 2013-09-23 NOTE — Telephone Encounter (Signed)
Form faxed back and scanned 

## 2013-09-23 NOTE — Telephone Encounter (Signed)
Pt left v/m returning call. Pt request cb.

## 2013-09-23 NOTE — Telephone Encounter (Signed)
.  left message to have patient return my call.   Dr. Silvio Pate please see prior auth form on your desk for Xarelto

## 2013-09-23 NOTE — Telephone Encounter (Signed)
Pt left several v/m in a row and I called pt to let him know prior auth for Xarelto was completed and faxed back in; pt request cb when receive approval or denial of med.

## 2013-10-07 ENCOUNTER — Other Ambulatory Visit: Payer: Self-pay | Admitting: Internal Medicine

## 2013-12-18 ENCOUNTER — Ambulatory Visit (INDEPENDENT_AMBULATORY_CARE_PROVIDER_SITE_OTHER): Payer: Medicare Other | Admitting: Podiatry

## 2013-12-18 DIAGNOSIS — E1142 Type 2 diabetes mellitus with diabetic polyneuropathy: Secondary | ICD-10-CM | POA: Diagnosis not present

## 2013-12-18 DIAGNOSIS — Q828 Other specified congenital malformations of skin: Secondary | ICD-10-CM

## 2013-12-18 DIAGNOSIS — M79676 Pain in unspecified toe(s): Secondary | ICD-10-CM | POA: Diagnosis not present

## 2013-12-18 DIAGNOSIS — B351 Tinea unguium: Secondary | ICD-10-CM

## 2013-12-18 NOTE — Progress Notes (Signed)
Presents today chief complaint of painful elongated toenails.  Objective: Pulses are palpable bilateral nails are thick, yellow dystrophic onychomycosis and painful palpation.   Assessment: Onychomycosis with pain in limb.  Plan: Treatment of nails in thickness and length as covered service secondary to pain.  

## 2014-01-02 DIAGNOSIS — Z23 Encounter for immunization: Secondary | ICD-10-CM | POA: Diagnosis not present

## 2014-01-22 ENCOUNTER — Telehealth: Payer: Self-pay | Admitting: Internal Medicine

## 2014-01-22 NOTE — Telephone Encounter (Signed)
Left message on VM at home to get more information, I don't see that we ever did a referral to urology.

## 2014-01-22 NOTE — Telephone Encounter (Signed)
Pt thought he had an appt with [facility] and they have moved and told him that they do not have an appt for him. He is requesting a referral to another urologist.

## 2014-01-22 NOTE — Telephone Encounter (Signed)
Spoke with patient and he only goes to urology once a year, he was asking if he needs to continue to the urology? I asked if he has a new acute problem and he said no, so I advised he could follow-up with Dr. Silvio Pate. Patient agreed.

## 2014-01-22 NOTE — Telephone Encounter (Signed)
I have looked at past notes from Dr Jacqlyn Larsen (I last saw one in 2012) Unless he has a new acute problem, I am not sure he needs to see a urologist now Is there something new going on?

## 2014-02-10 ENCOUNTER — Telehealth: Payer: Self-pay

## 2014-02-10 NOTE — Telephone Encounter (Signed)
Caller: Jimmy Riggs/Patient; Phone: [phone]; Reason for Call: Returned call from [facility] regarding medication question 02/10/14.  Origionally called to ask if there was anything cheaper than Xarelto 20 mg, 30 day supply.  Per EMR message from Dr Silvio Pate, informed: "Only similar med would be apixaban (eliquis) 2. 5mg  twice a day.  He can check the price of this also.  If not, he will need coumadin which is not quite as safe, has all the dietary restrictions and needs regular blood monitoring and dose adjustments.  Xarelto and eliquis are worth the price if he has the money."   RN called [name]/RPh at [facility] to ask price of Eliquis BID.  Because pt is currently in the Medicaid "gap", all of these medications are fairly expensive.  Pt needs to contact his insurance to determine the start of new benefit year or when gap ends.  Pt informed and verbalized understanding.  Requests to speak with directly Dr Silvio Pate regarding if he has to take this type of medication.  Please call back.

## 2014-02-10 NOTE — Telephone Encounter (Signed)
Pt left v/m requesting cb today about medication; called pt back and left v/m requesting pt to cb.

## 2014-02-10 NOTE — Telephone Encounter (Signed)
Only similar med would be apixaban (eliquis) 2.5mg  twice a day. He can check the price of this also  If not, he will need coumadin which is not quite as safe, has all the dietary restrictions and needs regular blood monitoring and dose adjustments.  xarelto and eliquis are worth the price if he has the money

## 2014-02-10 NOTE — Telephone Encounter (Signed)
Message left on machine Will try again tomorrow

## 2014-02-10 NOTE — Telephone Encounter (Signed)
Pt left v/m; Xarelto cost pt $3.00 per day and pt request less expensive med for atrial fib. Pt request cb.

## 2014-02-11 NOTE — Telephone Encounter (Signed)
Discussed with him Told him I don't see any reasonable alternative and that I would like him to continue the xarelto Hopefully will go down again after the new year

## 2014-02-19 ENCOUNTER — Ambulatory Visit (INDEPENDENT_AMBULATORY_CARE_PROVIDER_SITE_OTHER): Payer: Medicare Other | Admitting: Podiatry

## 2014-02-19 DIAGNOSIS — B351 Tinea unguium: Secondary | ICD-10-CM | POA: Diagnosis not present

## 2014-02-19 DIAGNOSIS — M79676 Pain in unspecified toe(s): Secondary | ICD-10-CM

## 2014-02-19 DIAGNOSIS — Q828 Other specified congenital malformations of skin: Secondary | ICD-10-CM

## 2014-02-19 NOTE — Progress Notes (Signed)
Presents today chief complaint of painful elongated toenails.  Objective: Pulses are palpable bilateral nails are thick, yellow dystrophic onychomycosis and painful palpation.   Assessment: Onychomycosis with pain in limb.  Plan: Treatment of nails in thickness and length as covered service secondary to pain.  

## 2014-02-20 ENCOUNTER — Other Ambulatory Visit: Payer: Self-pay | Admitting: *Deleted

## 2014-02-20 MED ORDER — RIVAROXABAN 20 MG PO TABS: 20.0000 mg | ORAL_TABLET | Freq: Every day | ORAL | Status: DC

## 2014-03-11 DIAGNOSIS — Z85820 Personal history of malignant melanoma of skin: Secondary | ICD-10-CM | POA: Diagnosis not present

## 2014-03-11 DIAGNOSIS — L578 Other skin changes due to chronic exposure to nonionizing radiation: Secondary | ICD-10-CM | POA: Diagnosis not present

## 2014-03-11 DIAGNOSIS — D229 Melanocytic nevi, unspecified: Secondary | ICD-10-CM | POA: Diagnosis not present

## 2014-03-11 DIAGNOSIS — Z85828 Personal history of other malignant neoplasm of skin: Secondary | ICD-10-CM | POA: Diagnosis not present

## 2014-03-11 DIAGNOSIS — D1800 Hemangioma unspecified site: Secondary | ICD-10-CM | POA: Diagnosis not present

## 2014-03-11 DIAGNOSIS — D692 Other nonthrombocytopenic purpura: Secondary | ICD-10-CM | POA: Diagnosis not present

## 2014-03-11 DIAGNOSIS — L820 Inflamed seborrheic keratosis: Secondary | ICD-10-CM | POA: Diagnosis not present

## 2014-03-11 DIAGNOSIS — Z1283 Encounter for screening for malignant neoplasm of skin: Secondary | ICD-10-CM | POA: Diagnosis not present

## 2014-03-20 ENCOUNTER — Other Ambulatory Visit: Payer: Self-pay | Admitting: *Deleted

## 2014-03-20 MED ORDER — ONETOUCH ULTRASOFT LANCETS MISC: Status: DC

## 2014-03-24 ENCOUNTER — Ambulatory Visit (INDEPENDENT_AMBULATORY_CARE_PROVIDER_SITE_OTHER): Payer: Medicare Other | Admitting: Internal Medicine

## 2014-03-24 ENCOUNTER — Encounter: Payer: Self-pay | Admitting: Internal Medicine

## 2014-03-24 VITALS — BP 130/70 | HR 60 | Temp 97.7°F | Wt 155.0 lb

## 2014-03-24 DIAGNOSIS — I4819 Other persistent atrial fibrillation: Secondary | ICD-10-CM

## 2014-03-24 DIAGNOSIS — E1140 Type 2 diabetes mellitus with diabetic neuropathy, unspecified: Secondary | ICD-10-CM

## 2014-03-24 DIAGNOSIS — E785 Hyperlipidemia, unspecified: Secondary | ICD-10-CM

## 2014-03-24 DIAGNOSIS — I2510 Atherosclerotic heart disease of native coronary artery without angina pectoris: Secondary | ICD-10-CM | POA: Diagnosis not present

## 2014-03-24 DIAGNOSIS — E1142 Type 2 diabetes mellitus with diabetic polyneuropathy: Secondary | ICD-10-CM

## 2014-03-24 DIAGNOSIS — I481 Persistent atrial fibrillation: Secondary | ICD-10-CM | POA: Diagnosis not present

## 2014-03-24 LAB — HM DIABETES FOOT EXAM

## 2014-03-24 LAB — HEMOGLOBIN A1C: Hgb A1c MFr Bld: 6.1 % (ref 4.6–6.5)

## 2014-03-24 NOTE — Assessment & Plan Note (Signed)
Good rate control On xarelto 

## 2014-03-24 NOTE — Assessment & Plan Note (Signed)
No angina Has been quiet On appropriate meds

## 2014-03-24 NOTE — Assessment & Plan Note (Signed)
Still seems to be well controlled Due for A1c

## 2014-03-24 NOTE — Progress Notes (Signed)
Subjective:    Patient ID: Jimmy Riggs., male    DOB: April 15, 1924, 81 y.o.   MRN: [id number]  HPI Here for follow up of atrial fibrillation, diabetes and other medical conditions  No new concerns No problems with the xarelto No palpitations No chest pain No SOB Tries to exercise regularly--- AM routine (push ups, etc)  Checks sugars daily Usually 90-100 No hypoglycemic reactions Regular foot exams--- no pain or numbness Does peel on his feet Eye exam later this week--- [place]  Still with every 2 hour nocturia Goes frequently during the day--this is not a problem No incontinence  Current Outpatient Prescriptions on File Prior to Visit  Medication Sig Dispense Refill  . CRESTOR 5 MG tablet Take 1 tablet by mouth   every other day 45 tablet 3  . diltiazem (CARDIZEM CD) 120 MG 24 hr capsule Take 1 capsule by mouth  daily 90 capsule 3  . glucose blood (ONE TOUCH ULTRA TEST) test strip Use as instructed to test blood sugar once daily dx: 250.60 100 each 3  . hydrochlorothiazide (HYDRODIURIL) 25 MG tablet Take 1 tablet by mouth  daily 90 tablet 3  . Lancets (ONETOUCH ULTRASOFT) lancets Use as instructed to test blood sugar once daily dx: E11.49 100 each 3  . lisinopril (PRINIVIL,ZESTRIL) 20 MG tablet Take 1 tablet by mouth  daily 90 tablet 3  . Multiple Vitamin (MULTIVITAMIN) tablet Take 1 tablet by mouth daily.      . rivaroxaban (XARELTO) 20 MG TABS tablet Take 1 tablet (20 mg total) by mouth daily with supper. 90 tablet 3  . triamcinolone cream (KENALOG) 0.1 % Apply 1 application topically 2 (two) times daily as needed. 45 g 2   No current facility-administered medications on file prior to visit.    No Known Allergies  Past Medical History  Diagnosis Date  . Atrial fibrillation   . Coronary artery disease   . Hypertension   . Hyperlipidemia   . Benign prostatic hypertrophy   . Lymphoma   . NIDDM (non-insulin dependent diabetes mellitus)   . Osteoarthritis   .  Acute intra-cranial hemorrhage     w/ coumadin  . Melanoma in situ of shoulder   . S/P tonsillectomy and adenoidectomy 1931  . Inguinal hernia     Past Surgical History  Procedure Laterality Date  . Tonsillectomy and adenoidectomy    . Cholecystectomy    . Cataract extraction      Family History  Problem Relation Age of Onset  . Prostate cancer Father   . Heart failure Mother   . Diabetes Mother   . Hypertension Mother   . Arthritis Sister     History   Social History  . Marital Status: Widowed    Spouse Name: N/A    Number of Children: 3  . Years of Education: N/A   Occupational History  . retired- Art gallery manager    Social History Main Topics  . Smoking status: Former Smoker -- 0.00 packs/day for 20 years    Quit date: 03/14/1980  . Smokeless tobacco: Never Used  . Alcohol Use: Yes  . Drug Use: No  . Sexual Activity: Not on file   Other Topics Concern  . Not on file   Social History Narrative   Wife died May 28, 2022      Has living will   Son Eddie Dibbles is health care POA   Has DNR order   No tube feedings if cognitively unaware   Review of  Systems Still limited walking due to left knee pain Doesn't take any pain meds Appetite is fine Weight is stable Sleep is mixed---up due to nocturia    Objective:   Physical Exam  Constitutional: He appears well-developed and well-nourished. No distress.  Neck: Normal range of motion. Neck supple. No thyromegaly present.  Cardiovascular: Normal rate and normal heart sounds.   Irregular Faint pulse in left foot, 1+ on right  Pulmonary/Chest: Effort normal and breath sounds normal. No respiratory distress. He has no wheezes. He has no rales.  Musculoskeletal: He exhibits no edema or tenderness.  Lymphadenopathy:    He has no cervical adenopathy.  Neurological:  Decreased sensation in feet  Skin:  Mycotic toenails with some flaking and callous No ulcers though          Assessment & Plan:

## 2014-03-24 NOTE — Progress Notes (Signed)
Pre visit review using our clinic review tool, if applicable. No additional management support is needed unless otherwise documented below in the visit note. 

## 2014-03-24 NOTE — Assessment & Plan Note (Signed)
Mild numbness No sig pain No Rx needed

## 2014-03-24 NOTE — Assessment & Plan Note (Signed)
No problems with statin 

## 2014-03-25 ENCOUNTER — Encounter: Payer: Self-pay | Admitting: *Deleted

## 2014-03-25 DIAGNOSIS — H35363 Drusen (degenerative) of macula, bilateral: Secondary | ICD-10-CM | POA: Diagnosis not present

## 2014-03-25 DIAGNOSIS — E119 Type 2 diabetes mellitus without complications: Secondary | ICD-10-CM | POA: Diagnosis not present

## 2014-03-25 DIAGNOSIS — H52223 Regular astigmatism, bilateral: Secondary | ICD-10-CM | POA: Diagnosis not present

## 2014-03-25 DIAGNOSIS — H1851 Endothelial corneal dystrophy: Secondary | ICD-10-CM | POA: Diagnosis not present

## 2014-03-25 DIAGNOSIS — Z9849 Cataract extraction status, unspecified eye: Secondary | ICD-10-CM | POA: Diagnosis not present

## 2014-04-23 ENCOUNTER — Telehealth: Payer: Self-pay

## 2014-04-23 NOTE — Telephone Encounter (Signed)
Let him know he can call [facility] for that (right by [facility]) and he shouldn't need a referral

## 2014-04-23 NOTE — Telephone Encounter (Signed)
I spoke to patient and gave him the phone number to [facility].

## 2014-04-23 NOTE — Telephone Encounter (Signed)
Pt left v/m requesting name of someone in [place] area who could trim pts nails. Pt request cb.

## 2014-05-19 ENCOUNTER — Ambulatory Visit (INDEPENDENT_AMBULATORY_CARE_PROVIDER_SITE_OTHER): Payer: Medicare Other | Admitting: Podiatry

## 2014-05-19 DIAGNOSIS — B351 Tinea unguium: Secondary | ICD-10-CM

## 2014-05-19 DIAGNOSIS — M79676 Pain in unspecified toe(s): Secondary | ICD-10-CM | POA: Diagnosis not present

## 2014-05-19 NOTE — Progress Notes (Signed)
He presents today with a chief complaint of painful elongated toenails one through 5 bilateral.  Objective: Nails are thick yellow dystrophic with mycotic and painful palpation.  Assessment: Pain in limb secondary to onychomycosis 1 through 5 bilateral.  Plan: Debridement of nails thickness and length as cover service secondary to pain.

## 2014-08-15 ENCOUNTER — Other Ambulatory Visit: Payer: Self-pay | Admitting: Internal Medicine

## 2014-08-25 ENCOUNTER — Ambulatory Visit (INDEPENDENT_AMBULATORY_CARE_PROVIDER_SITE_OTHER): Payer: Medicare Other | Admitting: Podiatry

## 2014-08-25 DIAGNOSIS — M79676 Pain in unspecified toe(s): Secondary | ICD-10-CM

## 2014-08-25 DIAGNOSIS — B351 Tinea unguium: Secondary | ICD-10-CM | POA: Diagnosis not present

## 2014-08-25 NOTE — Progress Notes (Signed)
He presents today with a chief complaint of painful elongated toenails one through 5 bilateral.  Objective: Nails are thick yellow dystrophic with mycotic and painful palpation.  Assessment: Pain in limb secondary to onychomycosis 1 through 5 bilateral.  Plan: Debridement of nails thickness and length as cover service secondary to pain.           Distal corn 3 R.

## 2014-09-10 DIAGNOSIS — Z1283 Encounter for screening for malignant neoplasm of skin: Secondary | ICD-10-CM | POA: Diagnosis not present

## 2014-09-10 DIAGNOSIS — Z85828 Personal history of other malignant neoplasm of skin: Secondary | ICD-10-CM | POA: Diagnosis not present

## 2014-09-10 DIAGNOSIS — D692 Other nonthrombocytopenic purpura: Secondary | ICD-10-CM | POA: Diagnosis not present

## 2014-09-10 DIAGNOSIS — B351 Tinea unguium: Secondary | ICD-10-CM | POA: Diagnosis not present

## 2014-09-10 DIAGNOSIS — L578 Other skin changes due to chronic exposure to nonionizing radiation: Secondary | ICD-10-CM | POA: Diagnosis not present

## 2014-09-10 DIAGNOSIS — L814 Other melanin hyperpigmentation: Secondary | ICD-10-CM | POA: Diagnosis not present

## 2014-09-10 DIAGNOSIS — L570 Actinic keratosis: Secondary | ICD-10-CM | POA: Diagnosis not present

## 2014-09-10 DIAGNOSIS — B353 Tinea pedis: Secondary | ICD-10-CM | POA: Diagnosis not present

## 2014-09-10 DIAGNOSIS — D1800 Hemangioma unspecified site: Secondary | ICD-10-CM | POA: Diagnosis not present

## 2014-09-10 DIAGNOSIS — Z85820 Personal history of malignant melanoma of skin: Secondary | ICD-10-CM | POA: Diagnosis not present

## 2014-09-10 DIAGNOSIS — D229 Melanocytic nevi, unspecified: Secondary | ICD-10-CM | POA: Diagnosis not present

## 2014-09-10 DIAGNOSIS — L905 Scar conditions and fibrosis of skin: Secondary | ICD-10-CM | POA: Diagnosis not present

## 2014-09-25 ENCOUNTER — Ambulatory Visit (INDEPENDENT_AMBULATORY_CARE_PROVIDER_SITE_OTHER): Payer: Medicare Other | Admitting: Podiatry

## 2014-09-25 DIAGNOSIS — M79676 Pain in unspecified toe(s): Secondary | ICD-10-CM

## 2014-09-25 DIAGNOSIS — E1142 Type 2 diabetes mellitus with diabetic polyneuropathy: Secondary | ICD-10-CM | POA: Diagnosis not present

## 2014-09-25 DIAGNOSIS — B351 Tinea unguium: Secondary | ICD-10-CM | POA: Diagnosis not present

## 2014-09-25 DIAGNOSIS — I2510 Atherosclerotic heart disease of native coronary artery without angina pectoris: Secondary | ICD-10-CM

## 2014-09-25 NOTE — Progress Notes (Signed)
Subjective: 81 y.o. male returns the office today for painful, elongated, thickened toenails which he is unable to trim himself. Denies any redness or drainage around the nails. Denies any acute changes since last appointment and no new complaints today. Denies any systemic complaints such as fevers, chills, nausea, vomiting.   Objective: AAO 3, NAD DP/PT pulses palpable, CRT less than 3 seconds Protective sensation decreased with Simms Weinstein monofilament Nails hypertrophic, dystrophic, elongated, brittle, discolored 10. There is tenderness overlying the nails 1-5 bilaterally. There is no surrounding erythema or drainage along the nail sites. Hyperkeratotic lesion distal right third digit. Upon debridement underlying ulceration, drainage, or other clinical signs of infection. No open lesions or pre-ulcerative lesions are identified. No other areas of tenderness bilateral lower extremities. No overlying edema, erythema, increased warmth. No pain with calf compression, swelling, warmth, erythema.  Assessment: Patient presents with symptomatic onychomycosis; corn right 3rd digit  Plan: -Treatment options including alternatives, risks, complications were discussed -Nails sharply debrided 10 without complication/bleeding. -Hyperkerotic lesion sharply debrided without complications/bleeding -Discussed daily foot inspection. If there are any changes, to call the office immediately.  -Follow-up in 1 month per his request or sooner if any problems are to arise. In the meantime, encouraged to call the office with any questions, concerns, changes symptoms.   Celesta Gentile, DPM

## 2014-09-29 ENCOUNTER — Ambulatory Visit (INDEPENDENT_AMBULATORY_CARE_PROVIDER_SITE_OTHER): Payer: Medicare Other | Admitting: Internal Medicine

## 2014-09-29 ENCOUNTER — Encounter: Payer: Self-pay | Admitting: Internal Medicine

## 2014-09-29 VITALS — BP 134/70 | HR 73 | Temp 98.1°F | Ht 64.25 in | Wt 151.0 lb

## 2014-09-29 DIAGNOSIS — I2510 Atherosclerotic heart disease of native coronary artery without angina pectoris: Secondary | ICD-10-CM

## 2014-09-29 DIAGNOSIS — I481 Persistent atrial fibrillation: Secondary | ICD-10-CM | POA: Diagnosis not present

## 2014-09-29 DIAGNOSIS — E1140 Type 2 diabetes mellitus with diabetic neuropathy, unspecified: Secondary | ICD-10-CM | POA: Diagnosis not present

## 2014-09-29 DIAGNOSIS — I4819 Other persistent atrial fibrillation: Secondary | ICD-10-CM

## 2014-09-29 DIAGNOSIS — I10 Essential (primary) hypertension: Secondary | ICD-10-CM | POA: Diagnosis not present

## 2014-09-29 DIAGNOSIS — E1142 Type 2 diabetes mellitus with diabetic polyneuropathy: Secondary | ICD-10-CM

## 2014-09-29 DIAGNOSIS — Z0000 Encounter for general adult medical examination without abnormal findings: Secondary | ICD-10-CM

## 2014-09-29 DIAGNOSIS — Z23 Encounter for immunization: Secondary | ICD-10-CM

## 2014-09-29 DIAGNOSIS — Z7189 Other specified counseling: Secondary | ICD-10-CM | POA: Insufficient documentation

## 2014-09-29 DIAGNOSIS — N400 Enlarged prostate without lower urinary tract symptoms: Secondary | ICD-10-CM

## 2014-09-29 NOTE — Assessment & Plan Note (Signed)
Good rate control On rivaroxaban

## 2014-09-29 NOTE — Progress Notes (Signed)
Pre visit review using our clinic review tool, if applicable. No additional management support is needed unless otherwise documented below in the visit note. 

## 2014-09-29 NOTE — Assessment & Plan Note (Signed)
I have personally reviewed the Medicare Annual Wellness questionnaire and have noted 1. The patient's medical and social history 2. Their use of alcohol, tobacco or illicit drugs 3. Their current medications and supplements 4. The patient's functional ability including ADL's, fall risks, home safety risks and hearing or visual             impairment. 5. Diet and physical activities 6. Evidence for depression or mood disorders  The patients weight, height, BMI and visual acuity have been recorded in the chart I have made referrals, counseling and provided education to the patient based review of the above and I have provided the pt with a written personalized care plan for preventive services.  I have provided you with a copy of your personalized plan for preventive services. Please take the time to review along with your updated medication list.  prevnar today Yearly flu vaccine No cancer screening due to age No sig cognitive issues or performance problems

## 2014-09-29 NOTE — Assessment & Plan Note (Signed)
BP Readings from Last 3 Encounters:  09/29/14 134/70  03/24/14 130/70  09/20/13 132/80   Good control Due for labs

## 2014-09-29 NOTE — Assessment & Plan Note (Signed)
No angina On appropriate meds No aspirin due to bleeding risk with xarelto

## 2014-09-29 NOTE — Assessment & Plan Note (Signed)
Seems to have good control No foot pain but still probably abnormal sensation--no action needed

## 2014-09-29 NOTE — Assessment & Plan Note (Signed)
Voids okay without meds

## 2014-09-29 NOTE — Progress Notes (Signed)
Subjective:    Patient ID: Jimmy Riggs., male    DOB: [birth year], 81 y.o.   MRN: [id number]  HPI Here for Medicare wellness and follow up of chronic medical conditions Reviewed form and advanced directives Reviewed other doctors-- Dr Maryellen Pile. Dr Jacqualyn Posey-- podiatry No recent eye exam No procedures or hospitalizations in the past year Has occasional drink--not daily No tobacco Does some exercise--- just in his house Vision and hearing are okay Still drives and independent with instrumental ADLs No falls No depression or anhedonia Mild memory issues but no problems with bills, etc  Has checked his sugars daily Usually around 100 No foot pain or lesions--just the rash there Overdue for eye exam  No palpitations Exercise tolerance is stable No chest pain No SOB No dizziness or syncope No edema  Some limitations in activity due to left knee pain Doesn't take any meds Doesn't want surgery  Voids okay Nocturia is stable-- every 2 hours or so No trouble with stream  Current Outpatient Prescriptions on File Prior to Visit  Medication Sig Dispense Refill  . CRESTOR 5 MG tablet Take 1 tablet by mouth  every other day 45 tablet 3  . diltiazem (CARDIZEM CD) 120 MG 24 hr capsule Take 1 capsule by mouth  daily 90 capsule 3  . glucose blood (ONE TOUCH ULTRA TEST) test strip Use as instructed to test blood sugar once daily dx: 250.60 100 each 3  . hydrochlorothiazide (HYDRODIURIL) 25 MG tablet Take 1 tablet by mouth  daily 90 tablet 3  . Lancets (ONETOUCH ULTRASOFT) lancets Use as instructed to test blood sugar once daily dx: E11.49 100 each 3  . lisinopril (PRINIVIL,ZESTRIL) 20 MG tablet Take 1 tablet by mouth  daily 90 tablet 3  . Multiple Vitamin (MULTIVITAMIN) tablet Take 1 tablet by mouth daily.      . rivaroxaban (XARELTO) 20 MG TABS tablet Take 1 tablet (20 mg total) by mouth daily with supper. 90 tablet 3  . triamcinolone cream (KENALOG) 0.1 % Apply 1 application  topically 2 (two) times daily as needed. 45 g 2   No current facility-administered medications on file prior to visit.    No Known Allergies  Past Medical History  Diagnosis Date  . Atrial fibrillation   . Coronary artery disease   . Hypertension   . Hyperlipidemia   . Benign prostatic hypertrophy   . Lymphoma   . NIDDM (non-insulin dependent diabetes mellitus)   . Osteoarthritis   . Acute intra-cranial hemorrhage     w/ coumadin  . Melanoma in situ of shoulder   . S/P tonsillectomy and adenoidectomy 1931  . Inguinal hernia     Past Surgical History  Procedure Laterality Date  . Tonsillectomy and adenoidectomy    . Cholecystectomy    . Cataract extraction      Family History  Problem Relation Age of Onset  . Prostate cancer Father   . Heart failure Mother   . Diabetes Mother   . Hypertension Mother   . Arthritis Sister     History   Social History  . Marital Status: Widowed    Spouse Name: N/A  . Number of Children: 3  . Years of Education: N/A   Occupational History  . retired- Art gallery manager    Social History Main Topics  . Smoking status: Former Smoker -- 0.00 packs/day for 20 years    Quit date: 03/14/1980  . Smokeless tobacco: Never Used  . Alcohol Use: Yes  .  Drug Use: No  . Sexual Activity: Not on file   Other Topics Concern  . Not on file   Social History Narrative   Wife died 06/07/22      Has living will   Son Eddie Dibbles is health care POA   Has DNR order   No tube feedings if cognitively unaware   Review of Systems Sleeps okay despite the nocturia Teeth okay-- sees dentist (doesn't remember name) Wears seat belt Bowels are okay No suspicious skin lesions No other joint pain  No bleeding areas---gums, urine, stool, etc    Objective:   Physical Exam  Constitutional: He is oriented to person, place, and time. He appears well-developed and well-nourished. No distress.  HENT:  Mouth/Throat: Oropharynx is clear and moist. No  oropharyngeal exudate.  Top plate  Neck: Normal range of motion. Neck supple. No thyromegaly present.  Cardiovascular: Normal rate, normal heart sounds and intact distal pulses.  Exam reveals no gallop.   No murmur heard. Irregular 1+ pulses on right, faint on left  Pulmonary/Chest: Effort normal and breath sounds normal. No respiratory distress. He has no wheezes. He has no rales.  Abdominal: Soft. There is no tenderness.  Musculoskeletal: He exhibits no edema or tenderness.  Lymphadenopathy:    He has no cervical adenopathy.  Neurological: He is alert and oriented to person, place, and time.  President-- "Obama, ?" [id number] Recall 2/3  Hard to judge sensation-- says "yes" even when I just make a motion to touch (kept his eyes open)  Skin: No erythema.  No foot ulcers Scaling and red rash on foot Mycotic toenails  Psychiatric: He has a normal mood and affect. His behavior is normal.          Assessment & Plan:

## 2014-09-29 NOTE — Assessment & Plan Note (Signed)
Has DNR 

## 2014-09-29 NOTE — Patient Instructions (Signed)
Please get your diabetic eye exam!! 

## 2014-09-29 NOTE — Addendum Note (Signed)
Addended by: Modena Nunnery on: 09/29/2014 03:58 PM   Modules accepted: Orders

## 2014-09-30 ENCOUNTER — Encounter: Payer: Self-pay | Admitting: Internal Medicine

## 2014-09-30 DIAGNOSIS — D696 Thrombocytopenia, unspecified: Secondary | ICD-10-CM | POA: Insufficient documentation

## 2014-09-30 LAB — CBC WITH DIFFERENTIAL/PLATELET
Basophils Absolute: 0 10*3/uL (ref 0.0–0.1)
Basophils Relative: 0.5 % (ref 0.0–3.0)
EOS ABS: 0.1 10*3/uL (ref 0.0–0.7)
Eosinophils Relative: 1.3 % (ref 0.0–5.0)
HCT: 39.7 % (ref 39.0–52.0)
Hemoglobin: 13.6 g/dL (ref 13.0–17.0)
LYMPHS PCT: 33.7 % (ref 12.0–46.0)
Lymphs Abs: 1.4 10*3/uL (ref 0.7–4.0)
MCHC: 34.3 g/dL (ref 30.0–36.0)
MCV: 94.7 fl (ref 78.0–100.0)
Monocytes Absolute: 0.6 10*3/uL (ref 0.1–1.0)
Monocytes Relative: 13.7 % — ABNORMAL HIGH (ref 3.0–12.0)
NEUTROS ABS: 2.2 10*3/uL (ref 1.4–7.7)
NEUTROS PCT: 50.8 % (ref 43.0–77.0)
PLATELETS: 144 10*3/uL — AB (ref 150.0–400.0)
RBC: 4.2 Mil/uL — ABNORMAL LOW (ref 4.22–5.81)
RDW: 13.5 % (ref 11.5–15.5)
WBC: 4.3 10*3/uL (ref 4.0–10.5)

## 2014-09-30 LAB — HEMOGLOBIN A1C: HEMOGLOBIN A1C: 5.7 % (ref 4.6–6.5)

## 2014-09-30 LAB — LIPID PANEL
Cholesterol: 112 mg/dL (ref 0–200)
HDL: 49.1 mg/dL (ref >39.00)
LDL CALC: 44 mg/dL (ref 0–99)
NonHDL: 62.9
Total CHOL/HDL Ratio: 2
Triglycerides: 95 mg/dL (ref 0.0–149.0)
VLDL: 19 mg/dL (ref 0.0–40.0)

## 2014-09-30 LAB — COMPREHENSIVE METABOLIC PANEL
ALBUMIN: 4.6 g/dL (ref 3.5–5.2)
ALT: 10 U/L (ref 0–53)
AST: 21 U/L (ref 0–37)
Alkaline Phosphatase: 42 U/L (ref 39–117)
BUN: 8 mg/dL (ref 6–23)
CHLORIDE: 92 meq/L — AB (ref 96–112)
CO2: 29 meq/L (ref 19–32)
Calcium: 9.4 mg/dL (ref 8.4–10.5)
Creatinine, Ser: 0.72 mg/dL (ref 0.40–1.50)
GFR: 109.39 mL/min (ref >60.00)
GLUCOSE: 114 mg/dL — AB (ref 70–99)
POTASSIUM: 3.5 meq/L (ref 3.5–5.1)
SODIUM: 129 meq/L — AB (ref 135–145)
Total Bilirubin: 0.8 mg/dL (ref 0.2–1.2)
Total Protein: 6.9 g/dL (ref 6.0–8.3)

## 2014-09-30 LAB — T4, FREE: Free T4: 0.79 ng/dL (ref 0.60–1.60)

## 2014-10-01 ENCOUNTER — Encounter: Payer: Self-pay | Admitting: *Deleted

## 2014-10-06 ENCOUNTER — Other Ambulatory Visit: Payer: Self-pay | Admitting: Internal Medicine

## 2014-10-10 ENCOUNTER — Other Ambulatory Visit: Payer: Self-pay | Admitting: Internal Medicine

## 2014-10-14 ENCOUNTER — Other Ambulatory Visit: Payer: Self-pay

## 2014-10-14 ENCOUNTER — Other Ambulatory Visit: Payer: Self-pay | Admitting: Internal Medicine

## 2014-10-14 MED ORDER — GLUCOSE BLOOD VI STRP: ORAL_STRIP | Status: DC

## 2014-10-14 NOTE — Telephone Encounter (Signed)
Pt [facility];advised done per protocol.

## 2014-10-19 ENCOUNTER — Other Ambulatory Visit: Payer: Self-pay | Admitting: Internal Medicine

## 2014-10-30 ENCOUNTER — Ambulatory Visit (INDEPENDENT_AMBULATORY_CARE_PROVIDER_SITE_OTHER): Payer: Medicare Other | Admitting: Podiatry

## 2014-10-30 DIAGNOSIS — I2510 Atherosclerotic heart disease of native coronary artery without angina pectoris: Secondary | ICD-10-CM | POA: Diagnosis not present

## 2014-10-30 DIAGNOSIS — B351 Tinea unguium: Secondary | ICD-10-CM

## 2014-10-30 DIAGNOSIS — E1142 Type 2 diabetes mellitus with diabetic polyneuropathy: Secondary | ICD-10-CM

## 2014-10-30 DIAGNOSIS — M79676 Pain in unspecified toe(s): Secondary | ICD-10-CM | POA: Diagnosis not present

## 2014-10-30 NOTE — Progress Notes (Signed)
Subjective: 81 y.o. male returns the office today for painful, elongated, thickened toenails which he is unable to trim himself. Denies any redness or drainage around the nails. Denies any acute changes since last appointment and no new complaints today. Denies any systemic complaints such as fevers, chills, nausea, vomiting. He is seeing a dermatologist for a rash on the top of his left foot.   Objective: AAO 3, NAD DP/PT pulses palpable, CRT less than 3 seconds Protective sensation decreased with Simms Weinstein monofilament Nails hypertrophic, dystrophic, elongated, brittle, discolored 10. There is tenderness overlying the nails 1-5 bilaterally. There is no surrounding erythema or drainage along the nail sites. Hyperkeratotic lesion distal right third digit. Upon debridement underlying ulceration, drainage, or other clinical signs of infection. Hammertoe contractures.  No open lesions or other pre-ulcerative lesions are identified. Dry, scaly rash the dorsal aspect of left foot. There is no ascending erythema, a sitting phalanx, fluctuance, crepitus, tenderness. He is being treated by dermatology for this.  No other areas of tenderness bilateral lower extremities. No overlying edema, erythema, increased warmth. No pain with calf compression, swelling, warmth, erythema.  Assessment: Patient presents with symptomatic onychomycosis; corn right 3rd digit  Plan: -Treatment options including alternatives, risks, complications were discussed -Nails sharply debrided 10 without complication/bleeding. -Hyperkerotic lesion sharply debrided without complications/bleeding -continue follow-up with dermatologist. -Discussed daily foot inspection. If there are any changes, to call the office immediately.  -Follow-up in 1 month per his request or sooner if any problems are to arise. In the meantime, encouraged to call the office with any questions, concerns, changes symptoms.   Celesta Gentile,  DPM

## 2015-01-08 DIAGNOSIS — Z23 Encounter for immunization: Secondary | ICD-10-CM | POA: Diagnosis not present

## 2015-01-12 DIAGNOSIS — Z85820 Personal history of malignant melanoma of skin: Secondary | ICD-10-CM | POA: Diagnosis not present

## 2015-01-12 DIAGNOSIS — L821 Other seborrheic keratosis: Secondary | ICD-10-CM | POA: Diagnosis not present

## 2015-01-12 DIAGNOSIS — Z85828 Personal history of other malignant neoplasm of skin: Secondary | ICD-10-CM | POA: Diagnosis not present

## 2015-01-12 DIAGNOSIS — B353 Tinea pedis: Secondary | ICD-10-CM | POA: Diagnosis not present

## 2015-01-12 DIAGNOSIS — R21 Rash and other nonspecific skin eruption: Secondary | ICD-10-CM | POA: Diagnosis not present

## 2015-01-12 DIAGNOSIS — L578 Other skin changes due to chronic exposure to nonionizing radiation: Secondary | ICD-10-CM | POA: Diagnosis not present

## 2015-01-29 ENCOUNTER — Ambulatory Visit: Payer: Medicare Other

## 2015-02-12 ENCOUNTER — Ambulatory Visit (HOSPITAL_COMMUNITY): Payer: Medicare Other | Admitting: Oncology

## 2015-02-12 LAB — HM DIABETES FOOT EXAM

## 2015-02-17 ENCOUNTER — Ambulatory Visit (INDEPENDENT_AMBULATORY_CARE_PROVIDER_SITE_OTHER): Payer: Medicare Other | Admitting: Sports Medicine

## 2015-02-17 ENCOUNTER — Encounter: Payer: Self-pay | Admitting: Sports Medicine

## 2015-02-17 ENCOUNTER — Ambulatory Visit: Payer: Medicare Other | Admitting: Podiatry

## 2015-02-17 DIAGNOSIS — M79676 Pain in unspecified toe(s): Secondary | ICD-10-CM | POA: Diagnosis not present

## 2015-02-17 DIAGNOSIS — B351 Tinea unguium: Secondary | ICD-10-CM | POA: Diagnosis not present

## 2015-02-17 DIAGNOSIS — E1142 Type 2 diabetes mellitus with diabetic polyneuropathy: Secondary | ICD-10-CM

## 2015-02-17 DIAGNOSIS — L84 Corns and callosities: Secondary | ICD-10-CM | POA: Diagnosis not present

## 2015-02-17 NOTE — Progress Notes (Signed)
Patient ID: Jimmy Riggs., male   DOB: [birth year], 81 y.o.   MRN: [id number] Subjective: Taevon Sendra. is a 81 y.o. male patient with history of type 2 diabetes who presents to office today complaining of long, painful nails  while ambulating in shoes; unable to trim. Patient states that the glucose reading this morning was 119 mg/dl. Patient denies any new changes in medication or new problems. Patient denies any new cramping, numbness, burning or tingling in the legs.  Patient Active Problem List   Diagnosis Date Noted  . Thrombocytopenia ([place])   . Preventative health care 09/29/2014  . Advance directive discussed with patient 09/29/2014  . Diabetes, polyneuropathy ([place]) 04/08/2013  . Osteoarthritis of left knee 06/09/2011  . Type 2 diabetes, controlled, with peripheral neuropathy ([place]) 07/28/2006  . Hyperlipemia 07/28/2006  . Essential hypertension, benign 07/28/2006  . Coronary atherosclerosis of native coronary artery 07/28/2006  . ATRIAL FIBRILLATION 07/28/2006  . BPH (benign prostatic hypertrophy) 07/28/2006   Current Outpatient Prescriptions on File Prior to Visit  Medication Sig Dispense Refill  . CRESTOR 5 MG tablet Take 1 tablet by mouth  every other day 45 tablet 3  . diltiazem (CARDIZEM CD) 120 MG 24 hr capsule Take 1 capsule by mouth  daily 90 capsule 3  . glucose blood (ONE TOUCH ULTRA TEST) test strip Use as instructed to test blood sugar once daily dx: E11.40 100 each 3  . hydrochlorothiazide (HYDRODIURIL) 25 MG tablet Take 1 tablet by mouth  daily 90 tablet 3  . Lancets (ONETOUCH ULTRASOFT) lancets Use as instructed to test blood sugar once daily dx: E11.49 100 each 3  . lisinopril (PRINIVIL,ZESTRIL) 20 MG tablet Take 1 tablet by mouth  daily 90 tablet 3  . Multiple Vitamin (MULTIVITAMIN) tablet Take 1 tablet by mouth daily.      Marland Kitchen triamcinolone cream (KENALOG) 0.1 % Apply 1 application topically 2 (two) times daily as needed. 45 g 2  . XARELTO 20 MG TABS tablet take  1 tablet by mouth once daily WITH SUPPER 30 tablet 11   No current facility-administered medications on file prior to visit.   No Known Allergies  Labs: HEMOGLOBIN A1C- no recent lab on file  Objective: General: Patient is awake, alert, and oriented x 3 and in no acute distress.  Integument: Skin is warm, dry and supple bilateral. Nails are tender, long, thickened and  dystrophic with subungual debris, consistent with onychomycosis, 1-5 bilateral. No signs of infection. No open lesions. + preulcerative callus present right 3rd toe distal tip with no signs of infection. Dry scaly rash to L>R dosal foot being treated by dermatology. Remaining integument unremarkable.  Vasculature:  Dorsalis Pedis pulse 1/4 bilateral. Posterior Tibial pulse  1/4 bilateral.  Capillary fill time <3 sec 1-5 bilateral. Scant hair growth to the level of the digits. Temperature gradient within normal limits. + varicosities present bilateral. No edema present bilateral.   Neurology: The patient has diminished sensation measured with a 5.07/10g Semmes Weinstein Monofilament at all pedal sites bilateral . Vibratory sensation diminished bilateral with tuning fork. No Babinski sign present bilateral.   Musculoskeletal: Mild asymptomatic hammertoes pedal deformities noted bilateral. Muscular strength 5/5 in all lower extremity muscular groups bilateral without pain or limitation on range of motion . No tenderness with calf compression bilateral.  Assessment and Plan: Problem List Items Addressed This Visit      Nervous and Auditory   Diabetes, polyneuropathy ([place])    Other Visit Diagnoses    Dermatophytosis  of nail    -  Primary    Pain of toe, unspecified laterality        Pre-ulcerative calluses        right 3rd toe      -Examined patient. -Discussed and educated patient on diabetic foot care, especially with  regards to the vascular, neurological and musculoskeletal systems.  -Stressed the importance of  good glycemic control and the detriment of not  controlling glucose levels in relation to the foot. -Mechanically debrided right 3rd toe callus and all nails 1-5 bilateral using sterile nail nipper and filed with dremel without incident  -Recommend good supportive shoes daily -Cont with treatment as per dermatology for lower extremity rash -Answered all patient questions -Patient to return as needed or in 3 months for at risk foot care -Patient advised to call the office if any problems or questions arise in the  Meantime.  Landis Martins, DPM

## 2015-02-18 ENCOUNTER — Other Ambulatory Visit: Payer: Self-pay | Admitting: Internal Medicine

## 2015-03-18 DIAGNOSIS — L603 Nail dystrophy: Secondary | ICD-10-CM | POA: Diagnosis not present

## 2015-03-18 DIAGNOSIS — I8310 Varicose veins of unspecified lower extremity with inflammation: Secondary | ICD-10-CM | POA: Diagnosis not present

## 2015-03-18 DIAGNOSIS — L300 Nummular dermatitis: Secondary | ICD-10-CM | POA: Diagnosis not present

## 2015-03-18 DIAGNOSIS — B353 Tinea pedis: Secondary | ICD-10-CM | POA: Diagnosis not present

## 2015-03-18 DIAGNOSIS — B351 Tinea unguium: Secondary | ICD-10-CM | POA: Diagnosis not present

## 2015-04-02 ENCOUNTER — Ambulatory Visit: Payer: Medicare Other | Admitting: Internal Medicine

## 2015-04-09 ENCOUNTER — Ambulatory Visit: Payer: Medicare Other | Admitting: Internal Medicine

## 2015-04-27 ENCOUNTER — Ambulatory Visit (INDEPENDENT_AMBULATORY_CARE_PROVIDER_SITE_OTHER): Payer: Medicare Other | Admitting: Internal Medicine

## 2015-04-27 ENCOUNTER — Encounter: Payer: Self-pay | Admitting: Internal Medicine

## 2015-04-27 VITALS — BP 136/80 | HR 76 | Wt 152.0 lb

## 2015-04-27 DIAGNOSIS — D696 Thrombocytopenia, unspecified: Secondary | ICD-10-CM

## 2015-04-27 DIAGNOSIS — E1142 Type 2 diabetes mellitus with diabetic polyneuropathy: Secondary | ICD-10-CM | POA: Diagnosis not present

## 2015-04-27 DIAGNOSIS — I4821 Permanent atrial fibrillation: Secondary | ICD-10-CM

## 2015-04-27 DIAGNOSIS — I482 Chronic atrial fibrillation: Secondary | ICD-10-CM | POA: Diagnosis not present

## 2015-04-27 DIAGNOSIS — I10 Essential (primary) hypertension: Secondary | ICD-10-CM | POA: Diagnosis not present

## 2015-04-27 LAB — RENAL FUNCTION PANEL
ALBUMIN: 4.7 g/dL (ref 3.5–5.2)
BUN: 8 mg/dL (ref 6–23)
CALCIUM: 9.4 mg/dL (ref 8.4–10.5)
CHLORIDE: 91 meq/L — AB (ref 96–112)
CO2: 31 mEq/L (ref 19–32)
Creatinine, Ser: 0.73 mg/dL (ref 0.40–1.50)
GFR: 107.53 mL/min (ref >60.00)
GLUCOSE: 153 mg/dL — AB (ref 70–99)
PHOSPHORUS: 3 mg/dL (ref 2.3–4.6)
POTASSIUM: 4 meq/L (ref 3.5–5.1)
SODIUM: 130 meq/L — AB (ref 135–145)

## 2015-04-27 LAB — HEMOGLOBIN A1C: Hgb A1c MFr Bld: 5.8 % (ref 4.6–6.5)

## 2015-04-27 NOTE — Assessment & Plan Note (Signed)
Rate control is fine On xarelto

## 2015-04-27 NOTE — Progress Notes (Signed)
Pre visit review using our clinic review tool, if applicable. No additional management support is needed unless otherwise documented below in the visit note. 

## 2015-04-27 NOTE — Assessment & Plan Note (Signed)
No abnormal bleeding despite this and the xarelto

## 2015-04-27 NOTE — Patient Instructions (Addendum)
Please call the eye doctor for your routine diabetic eye exam---Dr Rick Duff at [facility].

## 2015-04-27 NOTE — Assessment & Plan Note (Signed)
Hopefully still has good control Lab Results  Component Value Date   HGBA1C 5.7 09/29/2014   Will check A1c

## 2015-04-27 NOTE — Addendum Note (Signed)
Addended by: Viviana Simpler I on: 04/27/2015 02:30 PM   Modules accepted: Orders

## 2015-04-27 NOTE — Assessment & Plan Note (Signed)
BP Readings from Last 3 Encounters:  04/27/15 136/80  09/29/14 134/70  03/24/14 130/70   Good control No changes needed

## 2015-04-27 NOTE — Progress Notes (Signed)
Subjective:    Patient ID: Jimmy Riggs., male    DOB: [birth year], 82 y.o.   MRN: [id number]  HPI Here for follow up of diabetes and other chronic medical conditions  Feels okay other than his bad knee The left knee has chronic pain--- limits his ability to walk, etc Ibuprofen helps-- takes 3-4 per day  Checks sugars daily Doesn't remember the numbers but thinks they are fine No hypoglycemic reactions Going to the podiatrist lately--trim nails. No pain in feet Doesn't remember last eye exam  No palpitations No SOB No dizziness or fainting spells No edema No chest pain  No abnormal bleeding--gums, rectum, urine No abnormal bruising  Current Outpatient Prescriptions on File Prior to Visit  Medication Sig Dispense Refill  . CRESTOR 5 MG tablet Take 1 tablet by mouth  every other day 45 tablet 3  . diltiazem (CARDIZEM CD) 120 MG 24 hr capsule Take 1 capsule by mouth  daily 90 capsule 3  . glucose blood (ONE TOUCH ULTRA TEST) test strip Use as instructed to test blood sugar once daily dx: E11.40 100 each 3  . hydrochlorothiazide (HYDRODIURIL) 25 MG tablet Take 1 tablet by mouth  daily 90 tablet 3  . Lancets (ONETOUCH ULTRASOFT) lancets Use as instructed to test blood sugar once daily dx: E11.49 100 each 3  . lisinopril (PRINIVIL,ZESTRIL) 20 MG tablet Take 1 tablet by mouth  daily 90 tablet 3  . Multiple Vitamin (MULTIVITAMIN) tablet Take 1 tablet by mouth daily.      Marland Kitchen triamcinolone cream (KENALOG) 0.1 % apply to affected area twice a day if needed 45 g 2  . XARELTO 20 MG TABS tablet take 1 tablet by mouth once daily WITH SUPPER 30 tablet 11   No current facility-administered medications on file prior to visit.    No Known Allergies  Past Medical History  Diagnosis Date  . Atrial fibrillation ([place])   . Coronary artery disease   . Hypertension   . Hyperlipidemia   . Benign prostatic hypertrophy   . Lymphoma ([place])   . NIDDM (non-insulin dependent diabetes mellitus)     . Osteoarthritis   . Acute intra-cranial hemorrhage ([facility])     w/ coumadin  . Melanoma in situ of shoulder ([place])   . S/P tonsillectomy and adenoidectomy 1931  . Inguinal hernia   . Thrombocytopenia [facility])     Past Surgical History  Procedure Laterality Date  . Tonsillectomy and adenoidectomy    . Cholecystectomy    . Cataract extraction      Family History  Problem Relation Age of Onset  . Prostate cancer Father   . Heart failure Mother   . Diabetes Mother   . Hypertension Mother   . Arthritis Sister     Social History   Social History  . Marital Status: Widowed    Spouse Name: N/A  . Number of Children: 3  . Years of Education: N/A   Occupational History  . retired- Art gallery manager    Social History Main Topics  . Smoking status: Former Smoker -- 0.00 packs/day for 20 years    Quit date: 03/14/1980  . Smokeless tobacco: Never Used  . Alcohol Use: Yes  . Drug Use: No  . Sexual Activity: Not on file   Other Topics Concern  . Not on file   Social History Narrative   Wife died 06/12/2022      Has living will   Son Eddie Dibbles is health care POA  Has DNR order   No tube feedings if cognitively unaware   Review of Systems  Sleeps well Appetite is good Weight is stable     Objective:   Physical Exam  Constitutional: He appears well-developed and well-nourished. No distress.  Neck: Normal range of motion. Neck supple.  Cardiovascular: Normal rate, normal heart sounds and intact distal pulses.  Exam reveals no gallop.   No murmur heard. Faint pedal  irregular  Pulmonary/Chest: Effort normal and breath sounds normal. No respiratory distress. He has no wheezes. He has no rales.  Musculoskeletal: He exhibits no edema.  Lymphadenopathy:    He has no cervical adenopathy.  Skin:  No foot lesions  Psychiatric: He has a normal mood and affect. His behavior is normal.  Usual somewhat distant self--gets annoyed easily (like when I mention that he is overdue for eye  exam)          Assessment & Plan:

## 2015-04-29 ENCOUNTER — Encounter: Payer: Self-pay | Admitting: *Deleted

## 2015-04-29 DIAGNOSIS — H353131 Nonexudative age-related macular degeneration, bilateral, early dry stage: Secondary | ICD-10-CM | POA: Diagnosis not present

## 2015-04-29 DIAGNOSIS — Z9842 Cataract extraction status, left eye: Secondary | ICD-10-CM | POA: Diagnosis not present

## 2015-04-29 DIAGNOSIS — H52223 Regular astigmatism, bilateral: Secondary | ICD-10-CM | POA: Diagnosis not present

## 2015-04-29 DIAGNOSIS — E119 Type 2 diabetes mellitus without complications: Secondary | ICD-10-CM | POA: Diagnosis not present

## 2015-04-29 DIAGNOSIS — H1851 Endothelial corneal dystrophy: Secondary | ICD-10-CM | POA: Diagnosis not present

## 2015-04-29 DIAGNOSIS — Z9841 Cataract extraction status, right eye: Secondary | ICD-10-CM | POA: Diagnosis not present

## 2015-04-30 DIAGNOSIS — B353 Tinea pedis: Secondary | ICD-10-CM | POA: Diagnosis not present

## 2015-04-30 DIAGNOSIS — B351 Tinea unguium: Secondary | ICD-10-CM | POA: Diagnosis not present

## 2015-04-30 DIAGNOSIS — Z85828 Personal history of other malignant neoplasm of skin: Secondary | ICD-10-CM | POA: Diagnosis not present

## 2015-04-30 DIAGNOSIS — Z85820 Personal history of malignant melanoma of skin: Secondary | ICD-10-CM | POA: Diagnosis not present

## 2015-05-19 ENCOUNTER — Ambulatory Visit: Payer: Medicare Other | Admitting: Sports Medicine

## 2015-05-21 ENCOUNTER — Encounter: Payer: Self-pay | Admitting: Podiatry

## 2015-05-21 ENCOUNTER — Ambulatory Visit (INDEPENDENT_AMBULATORY_CARE_PROVIDER_SITE_OTHER): Payer: Medicare Other | Admitting: Podiatry

## 2015-05-21 DIAGNOSIS — B351 Tinea unguium: Secondary | ICD-10-CM | POA: Diagnosis not present

## 2015-05-21 DIAGNOSIS — E1142 Type 2 diabetes mellitus with diabetic polyneuropathy: Secondary | ICD-10-CM | POA: Diagnosis not present

## 2015-05-21 DIAGNOSIS — M79676 Pain in unspecified toe(s): Secondary | ICD-10-CM

## 2015-05-21 NOTE — Progress Notes (Signed)
Patient ID: Jimmy Riggs., male   DOB: 05/31/1924, 82 y.o.   MRN: [id number]  Subjective: 82 y.o. male returns the office today for painful, elongated, thickened toenails which he is unable to trim himself. Denies any redness or drainage around the nails. Denies any acute changes since last appointment and no new complaints today. Denies any systemic complaints such as fevers, chills, nausea, vomiting. He is seeing a dermatologist for a rash on the top of his left foot.   Objective: AAO 3, NAD DP/PT pulses palpable, CRT less than 3 seconds Protective sensation decreased with Simms Weinstein monofilament Nails hypertrophic, dystrophic, elongated, brittle, discolored 10. There is tenderness overlying the nails 1-5 bilaterally. There is no surrounding erythema or drainage along the nail sites. Hyperkeratotic lesion distal right third digit. Upon debridement underlying ulceration, drainage, or other clinical signs of infection. Hammertoe contractures.  No open lesions or other pre-ulcerative lesions are identified. No other areas of tenderness bilateral lower extremities. No overlying edema, erythema, increased warmth. No pain with calf compression, swelling, warmth, erythema.  Assessment: Patient presents with symptomatic onychomycosis; corn right 3rd digit  Plan: -Treatment options including alternatives, risks, complications were discussed -Nails sharply debrided 10 without complication/bleeding. -Hyperkerotic lesion sharply debrided without complications/bleeding -Discussed daily foot inspection. If there are any changes, to call the office immediately.  -Follow-up in 9 weeks or sooner if any problems are to arise. In the meantime, encouraged to call the office with any questions, concerns, changes symptoms.   Celesta Gentile, DPM

## 2015-06-12 ENCOUNTER — Ambulatory Visit: Payer: Medicare Other | Admitting: Sports Medicine

## 2015-06-18 ENCOUNTER — Other Ambulatory Visit: Payer: Self-pay

## 2015-06-18 MED ORDER — ROSUVASTATIN CALCIUM 5 MG PO TABS: ORAL_TABLET | ORAL | Status: DC

## 2015-06-18 MED ORDER — RIVAROXABAN 20 MG PO TABS: ORAL_TABLET | ORAL | Status: DC

## 2015-06-18 NOTE — Telephone Encounter (Signed)
Pt request refill crestor and xarelto to [facility]; pt said never got med at [facility]; last seen 04/27/15. Refill done per protocol; pt has appt 10/2015. Pt voiced understanding.

## 2015-07-30 ENCOUNTER — Encounter: Payer: Self-pay | Admitting: Podiatry

## 2015-07-30 ENCOUNTER — Ambulatory Visit (INDEPENDENT_AMBULATORY_CARE_PROVIDER_SITE_OTHER): Payer: Medicare Other | Admitting: Podiatry

## 2015-07-30 DIAGNOSIS — M79676 Pain in unspecified toe(s): Secondary | ICD-10-CM

## 2015-07-30 DIAGNOSIS — B351 Tinea unguium: Secondary | ICD-10-CM | POA: Diagnosis not present

## 2015-07-30 DIAGNOSIS — E1142 Type 2 diabetes mellitus with diabetic polyneuropathy: Secondary | ICD-10-CM | POA: Diagnosis not present

## 2015-07-30 DIAGNOSIS — L84 Corns and callosities: Secondary | ICD-10-CM

## 2015-07-30 NOTE — Progress Notes (Signed)
Patient ID: Jimmy Riggs., male   DOB: [birth year], 82 y.o.   MRN: [id number]  Subjective: 82 y.o. male returns the office today for painful, elongated, thickened toenails which he is unable to trim himself. Denies any redness or drainage around the nails. Denies any systemic complaints such as fevers, chills, nausea, vomiting. He is seeing a dermatologist for a rash on the top of his left foot.   Objective: AAO 3, NAD DP/PT pulses palpable, CRT less than 3 seconds Protective sensation decreased with Simms Weinstein monofilament Nails hypertrophic, dystrophic, elongated, brittle, discolored 10. There is tenderness overlying the nails 1-5 bilaterally. There is no surrounding erythema or drainage along the nail sites. Hyperkeratotic lesion distal right third digit bilaterally. Upon debridement underlying ulceration, drainage, or other clinical signs of infection. Hammertoe contractures.  No open lesions or other pre-ulcerative lesions are identified. No other areas of tenderness bilateral lower extremities. No overlying edema, erythema, increased warmth. No pain with calf compression, swelling, warmth, erythema.  Assessment: Patient presents with symptomatic onychomycosis; corn right 3rd digit  Plan: -Treatment options including alternatives, risks, complications were discussed -Nails sharply debrided 10 without complication/bleeding. -Hyperkerotic lesion sharply debrided without complications/bleeding x2 -Discussed daily foot inspection. If there are any changes, to call the office immediately.  -Follow-up in 9 weeks or sooner if any problems are to arise. In the meantime, encouraged to call the office with any questions, concerns, changes symptoms.   Celesta Gentile, DPM

## 2015-08-16 ENCOUNTER — Other Ambulatory Visit: Payer: Self-pay | Admitting: Internal Medicine

## 2015-08-24 ENCOUNTER — Other Ambulatory Visit: Payer: Self-pay | Admitting: Internal Medicine

## 2015-10-11 ENCOUNTER — Other Ambulatory Visit: Payer: Self-pay | Admitting: Internal Medicine

## 2015-10-26 ENCOUNTER — Ambulatory Visit: Payer: Medicare Other | Admitting: Internal Medicine

## 2015-10-27 ENCOUNTER — Telehealth: Payer: Self-pay | Admitting: Internal Medicine

## 2015-10-27 NOTE — Telephone Encounter (Signed)
Patient had an appointment with Dr.Letvak yesterday.  Patient said he was on his way to the appointment and took a wrong turn and got lost. Patient wanted to let Dr.Letvak know and patient said he would call back to reschedule the appointment.

## 2015-10-27 NOTE — Telephone Encounter (Signed)
Okay Please don't charge him He has some cognitive issues

## 2015-10-29 DIAGNOSIS — D1800 Hemangioma unspecified site: Secondary | ICD-10-CM | POA: Diagnosis not present

## 2015-10-29 DIAGNOSIS — L821 Other seborrheic keratosis: Secondary | ICD-10-CM | POA: Diagnosis not present

## 2015-10-29 DIAGNOSIS — Z85820 Personal history of malignant melanoma of skin: Secondary | ICD-10-CM | POA: Diagnosis not present

## 2015-10-29 DIAGNOSIS — L578 Other skin changes due to chronic exposure to nonionizing radiation: Secondary | ICD-10-CM | POA: Diagnosis not present

## 2015-10-29 DIAGNOSIS — Z1283 Encounter for screening for malignant neoplasm of skin: Secondary | ICD-10-CM | POA: Diagnosis not present

## 2015-10-29 DIAGNOSIS — Z85828 Personal history of other malignant neoplasm of skin: Secondary | ICD-10-CM | POA: Diagnosis not present

## 2015-10-29 DIAGNOSIS — B351 Tinea unguium: Secondary | ICD-10-CM | POA: Diagnosis not present

## 2015-10-29 DIAGNOSIS — D692 Other nonthrombocytopenic purpura: Secondary | ICD-10-CM | POA: Diagnosis not present

## 2015-10-29 DIAGNOSIS — D229 Melanocytic nevi, unspecified: Secondary | ICD-10-CM | POA: Diagnosis not present

## 2015-10-29 DIAGNOSIS — L812 Freckles: Secondary | ICD-10-CM | POA: Diagnosis not present

## 2015-11-05 ENCOUNTER — Ambulatory Visit (INDEPENDENT_AMBULATORY_CARE_PROVIDER_SITE_OTHER): Payer: Medicare Other | Admitting: Podiatry

## 2015-11-05 ENCOUNTER — Encounter: Payer: Self-pay | Admitting: Podiatry

## 2015-11-05 DIAGNOSIS — M79676 Pain in unspecified toe(s): Secondary | ICD-10-CM | POA: Diagnosis not present

## 2015-11-05 DIAGNOSIS — L84 Corns and callosities: Secondary | ICD-10-CM

## 2015-11-05 DIAGNOSIS — B351 Tinea unguium: Secondary | ICD-10-CM

## 2015-11-06 NOTE — Progress Notes (Signed)
Patient ID: Jimmy Riggs., male   DOB: [birth year], 82 y.o.   MRN: [id number]  Subjective: 82 y.o. male returns the office today for painful, elongated, thickened toenails which he is unable to trim himself. Denies any redness or drainage around the nails. Denies any systemic complaints such as fevers, chills, nausea, vomiting. He is seeing a dermatologist for a rash on the top of his left foot.   Objective: AAO 3, NAD DP/PT pulses palpable, CRT less than 3 seconds Protective sensation decreased with Simms Weinstein monofilament Nails hypertrophic, dystrophic, elongated, brittle, discolored 10. There is tenderness overlying the nails 1-5 bilaterally. There is no surrounding erythema or drainage along the nail sites. Hyperkeratotic lesion present right third toe. No underlying ulceration, drainage or other signs of infection. Hammertoe contractures.  No open lesions or other pre-ulcerative lesions are identified. No other areas of tenderness bilateral lower extremities. No overlying edema, erythema, increased warmth. No pain with calf compression, swelling, warmth, erythema.  Assessment: Patient presents with symptomatic onychomycosis; hyperkeratotic lesion third toe  Plan: -Treatment options including alternatives, risks, complications were discussed -Nails sharply debrided 10 without complication/bleeding. -Keratotic lesion debrided without complications or bleeding x 1 -Discussed daily foot inspection. If there are any changes, to call the office immediately.  -Follow-up in 9 weeks or sooner if any problems are to arise. In the meantime, encouraged to call the office with any questions, concerns, changes symptoms.   Celesta Gentile, DPM

## 2015-11-09 ENCOUNTER — Ambulatory Visit: Payer: Medicare Other | Admitting: Internal Medicine

## 2015-11-10 ENCOUNTER — Ambulatory Visit: Payer: Medicare Other | Admitting: Internal Medicine

## 2015-11-10 ENCOUNTER — Encounter: Payer: Self-pay | Admitting: Internal Medicine

## 2015-11-10 ENCOUNTER — Ambulatory Visit (INDEPENDENT_AMBULATORY_CARE_PROVIDER_SITE_OTHER): Payer: Medicare Other | Admitting: Internal Medicine

## 2015-11-10 VITALS — BP 128/76 | HR 73 | Temp 97.4°F | Wt 149.0 lb

## 2015-11-10 DIAGNOSIS — I481 Persistent atrial fibrillation: Secondary | ICD-10-CM

## 2015-11-10 DIAGNOSIS — D696 Thrombocytopenia, unspecified: Secondary | ICD-10-CM | POA: Diagnosis not present

## 2015-11-10 DIAGNOSIS — E1142 Type 2 diabetes mellitus with diabetic polyneuropathy: Secondary | ICD-10-CM | POA: Diagnosis not present

## 2015-11-10 DIAGNOSIS — F0150 Vascular dementia without behavioral disturbance: Secondary | ICD-10-CM | POA: Insufficient documentation

## 2015-11-10 DIAGNOSIS — R413 Other amnesia: Secondary | ICD-10-CM

## 2015-11-10 DIAGNOSIS — I4819 Other persistent atrial fibrillation: Secondary | ICD-10-CM

## 2015-11-10 LAB — CBC WITH DIFFERENTIAL/PLATELET
BASOS ABS: 0 10*3/uL (ref 0.0–0.1)
Basophils Relative: 0.4 % (ref 0.0–3.0)
EOS PCT: 1.7 % (ref 0.0–5.0)
Eosinophils Absolute: 0.1 10*3/uL (ref 0.0–0.7)
HCT: 37.2 % — ABNORMAL LOW (ref 39.0–52.0)
Hemoglobin: 13 g/dL (ref 13.0–17.0)
LYMPHS ABS: 1.3 10*3/uL (ref 0.7–4.0)
Lymphocytes Relative: 35.8 % (ref 12.0–46.0)
MCHC: 34.9 g/dL (ref 30.0–36.0)
MCV: 92.8 fl (ref 78.0–100.0)
MONO ABS: 0.6 10*3/uL (ref 0.1–1.0)
Monocytes Relative: 17.5 % — ABNORMAL HIGH (ref 3.0–12.0)
NEUTROS PCT: 44.6 % (ref 43.0–77.0)
Neutro Abs: 1.7 10*3/uL (ref 1.4–7.7)
Platelets: 135 10*3/uL — ABNORMAL LOW (ref 150.0–400.0)
RBC: 4.01 Mil/uL — AB (ref 4.22–5.81)
RDW: 13.9 % (ref 11.5–15.5)
WBC: 3.7 10*3/uL — ABNORMAL LOW (ref 4.0–10.5)

## 2015-11-10 LAB — COMPREHENSIVE METABOLIC PANEL
ALBUMIN: 4.4 g/dL (ref 3.5–5.2)
ALK PHOS: 33 U/L — AB (ref 39–117)
ALT: 9 U/L (ref 0–53)
AST: 15 U/L (ref 0–37)
BILIRUBIN TOTAL: 0.9 mg/dL (ref 0.2–1.2)
BUN: 6 mg/dL (ref 6–23)
CO2: 31 mEq/L (ref 19–32)
Calcium: 9.1 mg/dL (ref 8.4–10.5)
Chloride: 93 mEq/L — ABNORMAL LOW (ref 96–112)
Creatinine, Ser: 0.68 mg/dL (ref 0.40–1.50)
GFR: 116.56 mL/min (ref >60.00)
GLUCOSE: 125 mg/dL — AB (ref 70–99)
POTASSIUM: 4.4 meq/L (ref 3.5–5.1)
SODIUM: 129 meq/L — AB (ref 135–145)
TOTAL PROTEIN: 6.7 g/dL (ref 6.0–8.3)

## 2015-11-10 LAB — LIPID PANEL
CHOLESTEROL: 97 mg/dL (ref 0–200)
HDL: 44.8 mg/dL (ref >39.00)
LDL CALC: 42 mg/dL (ref 0–99)
NonHDL: 51.9
Total CHOL/HDL Ratio: 2
Triglycerides: 49 mg/dL (ref 0.0–149.0)
VLDL: 9.8 mg/dL (ref 0.0–40.0)

## 2015-11-10 LAB — T4, FREE: Free T4: 0.89 ng/dL (ref 0.60–1.60)

## 2015-11-10 LAB — HEMOGLOBIN A1C: HEMOGLOBIN A1C: 5.7 % (ref 4.6–6.5)

## 2015-11-10 LAB — VITAMIN B12: Vitamin B-12: 461 pg/mL (ref 211–911)

## 2015-11-10 NOTE — Assessment & Plan Note (Signed)
Rate is good Continues on the xarelto

## 2015-11-10 NOTE — Progress Notes (Signed)
Subjective:    Patient ID: Jimmy Riggs., male    DOB: [birth year], 82 y.o.   MRN: [id number]  HPI Here for follow up of diabetes and other chronic health conditions Here with DIL  Family is concerned about memory problems Admits that he is not socially active--not stimulated Will get lost in the car at times Has stopped going off campus for the most part Seems to be worse in the past month or so Is asking family to bring him to appointments Forgetting from one day to the next  Still cooks okay--very routine with this Hasn't been using the campus restaurant Still does household instrumental activities  Not depressed  Gets "mad that it happens"  Checks sugars daily Usually okay--doesn't remember numbers No hypoglycemia Feet still numb but not painful No recent eye exam---DIL will bring him  No chest pain No palpitations Breathing is okay No edema  Current Outpatient Prescriptions on File Prior to Visit  Medication Sig Dispense Refill  . diltiazem (CARDIZEM CD) 120 MG 24 hr capsule Take 1 capsule by mouth  daily 90 capsule 0  . glucose blood (ONE TOUCH ULTRA TEST) test strip Use as instructed to test blood sugar once daily dx: E11.40 100 each 3  . hydrochlorothiazide (HYDRODIURIL) 25 MG tablet Take 1 tablet by mouth  daily 90 tablet 0  . Lancets (ONETOUCH ULTRASOFT) lancets Use as instructed to test blood sugar once daily dx: E11.49 100 each 3  . lisinopril (PRINIVIL,ZESTRIL) 20 MG tablet Take 1 tablet by mouth  daily 90 tablet 0  . Multiple Vitamin (MULTIVITAMIN) tablet Take 1 tablet by mouth daily.      . rivaroxaban (XARELTO) 20 MG TABS tablet take 1 tablet by mouth once daily WITH SUPPER 90 tablet 1  . rosuvastatin (CRESTOR) 5 MG tablet Take 1 tablet by mouth  every other day 45 tablet 1  . triamcinolone cream (KENALOG) 0.1 % apply to affected area twice a day if needed 45 g 2   No current facility-administered medications on file prior to visit.     No Known  Allergies  Past Medical History:  Diagnosis Date  . Acute intra-cranial hemorrhage (HCC)    w/ coumadin  . Atrial fibrillation ([place])   . Benign prostatic hypertrophy   . Coronary artery disease   . Hyperlipidemia   . Hypertension   . Inguinal hernia   . Lymphoma ([place])   . Melanoma in situ of shoulder ([place])   . NIDDM (non-insulin dependent diabetes mellitus)   . Osteoarthritis   . S/P tonsillectomy and adenoidectomy 1931  . Thrombocytopenia ([place])     Past Surgical History:  Procedure Laterality Date  . CATARACT EXTRACTION    . CHOLECYSTECTOMY    . TONSILLECTOMY AND ADENOIDECTOMY      Family History  Problem Relation Age of Onset  . Prostate cancer Father   . Heart failure Mother   . Diabetes Mother   . Hypertension Mother   . Arthritis Sister     Social History   Social History  . Marital status: Widowed    Spouse name: N/A  . Number of children: 3  . Years of education: N/A   Occupational History  . retired- Art gallery manager Retired   Social History Main Topics  . Smoking status: Former Smoker    Packs/day: 0.00    Years: 20.00    Quit date: 03/14/1980  . Smokeless tobacco: Never Used  . Alcohol use Yes  . Drug use: No  .  Sexual activity: Not on file   Other Topics Concern  . Not on file   Social History Narrative   Wife died 06/14/2022      Has living will   Son Eddie Dibbles is health care POA   Has DNR order   No tube feedings if cognitively unaware   Review of Systems Weight is stable Sleeps okay No foot ulcers or other lesions Some problems with balance--slight per DIL (like stepping off curb) Knee is "bad" No incontinence Bruises easily but no blood in urine/stool etc    Objective:   Physical Exam  Constitutional: He appears well-developed and well-nourished. No distress.  Neck: Normal range of motion. Neck supple.  Cardiovascular: Normal rate, regular rhythm and normal heart sounds.  Exam reveals no gallop.   No murmur heard. Feet warm with  faint pulses  Pulmonary/Chest: Effort normal and breath sounds normal. No respiratory distress. He has no wheezes. He has no rales.  Musculoskeletal: He exhibits no edema.  Lymphadenopathy:    He has no cervical adenopathy.  Neurological:  Decreased sensation in feet   Skin:  Mycotic toenails No foot lesions  Psychiatric: He has a normal mood and affect. His behavior is normal.          Assessment & Plan:

## 2015-11-10 NOTE — Progress Notes (Signed)
Pre visit review using our clinic review tool, if applicable. No additional management support is needed unless otherwise documented below in the visit note. 

## 2015-11-10 NOTE — Assessment & Plan Note (Signed)
No abnormal bleeding

## 2015-11-10 NOTE — Assessment & Plan Note (Signed)
Diet controlled Will check labs again

## 2015-11-10 NOTE — Assessment & Plan Note (Signed)
Consistent with vascular dementia Will check thyroid, B12 and MRI to be sure no treatable causes No driving off campus Family to monitor filling of pill box Consider ordering food from [facility]

## 2015-11-15 ENCOUNTER — Other Ambulatory Visit: Payer: Self-pay | Admitting: Internal Medicine

## 2015-11-25 ENCOUNTER — Ambulatory Visit
Admission: RE | Admit: 2015-11-25 | Discharge: 2015-11-25 | Disposition: A | Discharge status: Home / Self Care | Payer: Medicare Other | Source: Ambulatory Visit | Attending: Internal Medicine | Admitting: Internal Medicine

## 2015-11-25 DIAGNOSIS — R413 Other amnesia: Secondary | ICD-10-CM | POA: Diagnosis not present

## 2015-11-25 DIAGNOSIS — G9389 Other specified disorders of brain: Secondary | ICD-10-CM | POA: Diagnosis not present

## 2016-01-04 ENCOUNTER — Other Ambulatory Visit: Payer: Self-pay | Admitting: Internal Medicine

## 2016-01-19 DIAGNOSIS — Z23 Encounter for immunization: Secondary | ICD-10-CM | POA: Diagnosis not present

## 2016-01-30 ENCOUNTER — Other Ambulatory Visit: Payer: Self-pay | Admitting: Internal Medicine

## 2016-02-01 ENCOUNTER — Other Ambulatory Visit: Payer: Self-pay | Admitting: Internal Medicine

## 2016-02-02 NOTE — Telephone Encounter (Signed)
Approved: okay for a year 

## 2016-02-09 ENCOUNTER — Encounter: Payer: Self-pay | Admitting: Podiatry

## 2016-02-09 ENCOUNTER — Ambulatory Visit: Payer: Medicare Other | Admitting: Podiatry

## 2016-02-09 ENCOUNTER — Ambulatory Visit (INDEPENDENT_AMBULATORY_CARE_PROVIDER_SITE_OTHER): Payer: Medicare Other | Admitting: Podiatry

## 2016-02-09 DIAGNOSIS — B351 Tinea unguium: Secondary | ICD-10-CM

## 2016-02-09 DIAGNOSIS — M79609 Pain in unspecified limb: Secondary | ICD-10-CM | POA: Diagnosis not present

## 2016-02-09 DIAGNOSIS — E0843 Diabetes mellitus due to underlying condition with diabetic autonomic (poly)neuropathy: Secondary | ICD-10-CM

## 2016-02-09 DIAGNOSIS — L603 Nail dystrophy: Secondary | ICD-10-CM

## 2016-02-09 DIAGNOSIS — L608 Other nail disorders: Secondary | ICD-10-CM

## 2016-02-09 NOTE — Progress Notes (Signed)
SUBJECTIVE Patient with a history of diabetes mellitus presents to office today complaining of elongated, thickened nails. Pain while ambulating in shoes. Patient is unable to trim their own nails.   No Known Allergies  OBJECTIVE General Patient is awake, alert, and oriented x 3 and in no acute distress. Derm Skin is dry and supple bilateral. Negative open lesions or macerations. Remaining integument unremarkable. Nails are tender, long, thickened and dystrophic with subungual debris, consistent with onychomycosis, 1-5 bilateral. No signs of infection noted. Vasc  DP and PT pedal pulses palpable bilaterally. Temperature gradient within normal limits.  Neuro Epicritic and protective threshold sensation diminished bilaterally.  Musculoskeletal Exam No symptomatic pedal deformities noted bilateral. Muscular strength within normal limits.  ASSESSMENT 1. Diabetes Mellitus w/ peripheral neuropathy 2. Onychomycosis of nail due to dermatophyte bilateral 3. Pain in foot bilateral  PLAN OF CARE 1. Patient evaluated today. 2. Instructed to maintain good pedal hygiene and foot care. Stressed importance of controlling blood sugar.  3. Mechanical debridement of nails 1-5 bilaterally performed using a nail nipper. Filed with dremel without incident.  4. Return to clinic in 3 mos.    Catrice Zuleta M Tajia Szeliga, DPM   

## 2016-02-11 ENCOUNTER — Encounter: Payer: Self-pay | Admitting: Internal Medicine

## 2016-02-11 ENCOUNTER — Ambulatory Visit (INDEPENDENT_AMBULATORY_CARE_PROVIDER_SITE_OTHER): Payer: Medicare Other | Admitting: Internal Medicine

## 2016-02-11 VITALS — BP 138/88 | HR 79 | Temp 97.5°F | Wt 145.0 lb

## 2016-02-11 DIAGNOSIS — F0150 Vascular dementia without behavioral disturbance: Secondary | ICD-10-CM | POA: Diagnosis not present

## 2016-02-11 NOTE — Patient Instructions (Signed)
Please consider ordering some meals from the [facility]

## 2016-02-11 NOTE — Assessment & Plan Note (Signed)
Mild Only mild functional problems now  Discussed concerns with the car, meds, eating enough Care planning for these things almost all of 20 minute visit No changes for now

## 2016-02-11 NOTE — Progress Notes (Signed)
Pre visit review using our clinic review tool, if applicable. No additional management support is needed unless otherwise documented below in the visit note. 

## 2016-02-11 NOTE — Progress Notes (Signed)
Subjective:    Patient ID: Jimmy Riggs., male    DOB: [birth year], 82 y.o.   MRN: [id number]  HPI Here with daughter for follow up of memory issues  Reviewed labs Reviewed MRI--clinically doesn't have NPH  He has resisted any help from family--won't let them in to do meds Limiting his driving-- just goes to [place] cooking his own meals  Uses pill box for his meds Rx via [facility] May have some problems with the crestor since it is only every other day  Does forget things-- called daughter about when his dental appointment was--just 3 days after she had taken him Mixes up days of other appointments One son couldn't reach him for a while-- not understanding his phone messages Current Outpatient Prescriptions on File Prior to Visit  Medication Sig Dispense Refill  . CARTIA XT 120 MG 24 hr capsule TAKE 1 CAPSULE BY MOUTH  DAILY 90 capsule 1  . glucose blood (ONE TOUCH ULTRA TEST) test strip Use as instructed to test blood sugar once daily dx: E11.40 100 each 3  . hydrochlorothiazide (HYDRODIURIL) 25 MG tablet Take 1 tablet by mouth  daily 90 tablet 0  . Lancets (ONETOUCH ULTRASOFT) lancets Use as instructed to test blood sugar once daily dx: E11.49 100 each 3  . lisinopril (PRINIVIL,ZESTRIL) 20 MG tablet TAKE 1 TABLET BY MOUTH  DAILY 90 tablet 3  . Multiple Vitamin (MULTIVITAMIN) tablet Take 1 tablet by mouth daily.      . rosuvastatin (CRESTOR) 5 MG tablet TAKE 1 TABLET BY MOUTH  EVERY OTHER DAY 45 tablet 3  . triamcinolone cream (KENALOG) 0.1 % apply to affected area twice a day if needed 45 g 2  . XARELTO 20 MG TABS tablet TAKE 1 TABLET BY MOUTH ONCE DAILY WITH SUPPER 90 tablet 3   No current facility-administered medications on file prior to visit.     No Known Allergies  Past Medical History:  Diagnosis Date  . Acute intra-cranial hemorrhage (HCC)    w/ coumadin  . Atrial fibrillation ([place])   . Benign prostatic hypertrophy   . Coronary artery disease   .  Hyperlipidemia   . Hypertension   . Inguinal hernia   . Lymphoma ([place])   . Melanoma in situ of shoulder ([place])   . NIDDM (non-insulin dependent diabetes mellitus)   . Osteoarthritis   . S/P tonsillectomy and adenoidectomy 1931  . Thrombocytopenia ([place])     Past Surgical History:  Procedure Laterality Date  . CATARACT EXTRACTION    . CHOLECYSTECTOMY    . TONSILLECTOMY AND ADENOIDECTOMY      Family History  Problem Relation Age of Onset  . Prostate cancer Father   . Heart failure Mother   . Diabetes Mother   . Hypertension Mother   . Arthritis Sister     Social History   Social History  . Marital status: Widowed    Spouse name: N/A  . Number of children: 3  . Years of education: N/A   Occupational History  . retired- Art gallery manager Retired   Social History Main Topics  . Smoking status: Former Smoker    Packs/day: 0.00    Years: 20.00    Quit date: 03/14/1980  . Smokeless tobacco: Never Used  . Alcohol use Yes  . Drug use: No  . Sexual activity: Not on file   Other Topics Concern  . Not on file   Social History Narrative   Wife died 2022/06/21  Has living will   Son Eddie Dibbles is health care POA   Has DNR order   No tube feedings if cognitively unaware   Review of Systems Weight is down some from past visits--but historically close to his past baselines Sleeps okay    Objective:   Physical Exam  Constitutional: No distress.  Neurological:  Limited insight but has normal engagement and appropriate interaction  Psychiatric: He has a normal mood and affect. His behavior is normal.          Assessment & Plan:

## 2016-02-29 ENCOUNTER — Encounter: Payer: Medicare Other | Admitting: Internal Medicine

## 2016-04-11 ENCOUNTER — Other Ambulatory Visit: Payer: Self-pay | Admitting: Internal Medicine

## 2016-04-20 ENCOUNTER — Telehealth: Payer: Self-pay

## 2016-04-20 NOTE — Telephone Encounter (Signed)
If he is walking, despite pain, a fracture is less likely. If he didn't fall, it is also less likely. Make sure he knows that we will have to send him to the ER anyway if it appears to be broken (so he may want to consider going there today if the pain is very bad or he can't really walk)

## 2016-04-20 NOTE — Telephone Encounter (Signed)
[facility] living nurse, Orson Slick, calls to report concern over patient's left hip which is bruised, swollen with a large misshapen lump.  Area is painful and appears possibly out of alignment.  He is able to weight bear but with a lot of pain.    He denies any injury but is in need of an evaluation.  I spoke with Dr. Silvio Pate who offered trying to get patient in with an orthopedist today; however, patient and daughter would rather wait to be seen here in the office tomorrow.  Appointment scheduled with Webb Silversmith, NP for 04/21/16 at 9:00am.

## 2016-04-20 NOTE — Telephone Encounter (Addendum)
Yes, I did discuss this with patient through the [facility] nurse this am.  He does not feel that it is broken and thinks that the lump is more of a "growth"; however, the nurse was not sure about that.  All parties, (patient, daughter and nurse) were comfortable with him waiting until tomorrow to be seen and reported normal, independent ambulation but painful.  They refused urgent care or ER option.    Thanks.

## 2016-04-20 NOTE — Telephone Encounter (Signed)
There is a strong possibility of a fractured hip. I will see him tomorrow but I really think he needs to be seen today.

## 2016-04-21 ENCOUNTER — Ambulatory Visit (INDEPENDENT_AMBULATORY_CARE_PROVIDER_SITE_OTHER)
Admission: RE | Admit: 2016-04-21 | Discharge: 2016-04-21 | Disposition: A | Discharge status: Home / Self Care | Payer: Medicare Other | Source: Ambulatory Visit | Attending: Internal Medicine | Admitting: Internal Medicine

## 2016-04-21 ENCOUNTER — Encounter: Payer: Self-pay | Admitting: Internal Medicine

## 2016-04-21 ENCOUNTER — Ambulatory Visit (INDEPENDENT_AMBULATORY_CARE_PROVIDER_SITE_OTHER): Payer: Medicare Other | Admitting: Internal Medicine

## 2016-04-21 VITALS — BP 128/74 | HR 82 | Temp 97.6°F | Wt 144.0 lb

## 2016-04-21 DIAGNOSIS — S7002XA Contusion of left hip, initial encounter: Secondary | ICD-10-CM | POA: Diagnosis not present

## 2016-04-21 DIAGNOSIS — Z7901 Long term (current) use of anticoagulants: Secondary | ICD-10-CM

## 2016-04-21 DIAGNOSIS — M25552 Pain in left hip: Secondary | ICD-10-CM | POA: Diagnosis not present

## 2016-04-21 NOTE — Progress Notes (Signed)
Subjective:    Patient ID: Jimmy Riggs., male    DOB: [birth year], 83 y.o.   MRN: [id number]  HPI  Pt presents to the clinic today with c/o his left hip swelling, bruising and large mass of the left hip. He reports the swelling and mass he first noticed 1 year ago, although he just mentioned it to his daughter earlier this week. He is not sure when he noticed the bruising. He denies a fall. He can bear weight but with some pain. He lives alone and per daughter has some dementia, and is not a great historian. He is on Xarelto for Afib.  Review of Systems      Past Medical History:  Diagnosis Date  . Acute intra-cranial hemorrhage (HCC)    w/ coumadin  . Atrial fibrillation ([place])   . Benign prostatic hypertrophy   . Coronary artery disease   . Hyperlipidemia   . Hypertension   . Inguinal hernia   . Lymphoma ([place])   . Melanoma in situ of shoulder ([place])   . NIDDM (non-insulin dependent diabetes mellitus)   . Osteoarthritis   . S/P tonsillectomy and adenoidectomy 1931  . Thrombocytopenia ([place])     Current Outpatient Prescriptions  Medication Sig Dispense Refill  . CARTIA XT 120 MG 24 hr capsule TAKE 1 CAPSULE BY MOUTH  DAILY 90 capsule 1  . glucose blood (ONE TOUCH ULTRA TEST) test strip Use as instructed to test blood sugar once daily dx: E11.40 100 each 3  . hydrochlorothiazide (HYDRODIURIL) 25 MG tablet Take 1 tablet by mouth  daily 90 tablet 0  . Lancets (ONETOUCH ULTRASOFT) lancets Use as instructed to test blood sugar once daily dx: E11.49 100 each 3  . lisinopril (PRINIVIL,ZESTRIL) 20 MG tablet TAKE 1 TABLET BY MOUTH  DAILY 90 tablet 3  . Multiple Vitamin (MULTIVITAMIN) tablet Take 1 tablet by mouth daily.      . rosuvastatin (CRESTOR) 5 MG tablet TAKE 1 TABLET BY MOUTH  EVERY OTHER DAY 45 tablet 3  . triamcinolone cream (KENALOG) 0.1 % apply to affected area twice a day if needed 45 g 2  . XARELTO 20 MG TABS tablet TAKE 1 TABLET BY MOUTH ONCE DAILY WITH SUPPER 90  tablet 3   No current facility-administered medications for this visit.     No Known Allergies  Family History  Problem Relation Age of Onset  . Prostate cancer Father   . Heart failure Mother   . Diabetes Mother   . Hypertension Mother   . Arthritis Sister     Social History   Social History  . Marital status: Widowed    Spouse name: N/A  . Number of children: 3  . Years of education: N/A   Occupational History  . retired- Art gallery manager Retired   Social History Main Topics  . Smoking status: Former Smoker    Packs/day: 0.00    Years: 20.00    Quit date: 03/14/1980  . Smokeless tobacco: Never Used  . Alcohol use Yes  . Drug use: No  . Sexual activity: Not on file   Other Topics Concern  . Not on file   Social History Narrative   Wife died 06/23/22      Has living will   Son Eddie Dibbles is health care POA   Has DNR order   No tube feedings if cognitively unaware     Constitutional: Denies fever, malaise, fatigue, headache or abrupt weight changes.  Musculoskeletal: Pt reports left  hip pain. Denies decrease in range of motion, muscle pain.  Skin: pt reports swelling and bruising of left hip. Denies redness, rashes, lesions or ulcercations.    No other specific complaints in a complete review of systems (except as listed in HPI above).  Objective:   Physical Exam    BP 128/74   Pulse 82   Temp 97.6 F (36.4 C) (Oral)   Wt 144 lb (65.3 kg)   SpO2 96%   BMI 24.53 kg/m  Wt Readings from Last 3 Encounters:  04/21/16 144 lb (65.3 kg)  02/11/16 145 lb (65.8 kg)  11/10/15 149 lb (67.6 kg)    General: Appears his stated age, in NAD. Skin: Bruising noted from left groin all the way around to his left buttock. Softball size hematoma noted of left hip. Musculoskeletal: Normal flexion, extension, abduction and adduction of the left hip. He has mild pain with internal and external rotation of the left hip. He has a shuffled gait, but slow and steady.     BMET    Component Value Date/Time   NA 129 (L) 11/10/2015 0945   K 4.4 11/10/2015 0945   CL 93 (L) 11/10/2015 0945   CO2 31 11/10/2015 0945   GLUCOSE 125 (H) 11/10/2015 0945   BUN 6 11/10/2015 0945   CREATININE 0.68 11/10/2015 0945   CALCIUM 9.1 11/10/2015 0945   GFRNONAA 114.55 01/16/2009 1132   GFRAA 119 04/11/2008 1117    Lipid Panel     Component Value Date/Time   CHOL 97 11/10/2015 0945   TRIG 49.0 11/10/2015 0945   HDL 44.80 11/10/2015 0945   CHOLHDL 2 11/10/2015 0945   VLDL 9.8 11/10/2015 0945   LDLCALC 42 11/10/2015 0945    CBC    Component Value Date/Time   WBC 3.7 (L) 11/10/2015 0945   RBC 4.01 (L) 11/10/2015 0945   HGB 13.0 11/10/2015 0945   HCT 37.2 (L) 11/10/2015 0945   PLT 135.0 (L) 11/10/2015 0945   MCV 92.8 11/10/2015 0945   MCHC 34.9 11/10/2015 0945   RDW 13.9 11/10/2015 0945   LYMPHSABS 1.3 11/10/2015 0945   MONOABS 0.6 11/10/2015 0945   EOSABS 0.1 11/10/2015 0945   BASOSABS 0.0 11/10/2015 0945    Hgb A1C Lab Results  Component Value Date   HGBA1C 5.7 11/10/2015          Assessment & Plan:   Left hip pain, swelling and bruising:  Large hematoma noted, not infected Will check xray of left hip today No issues weight bearing so likely no fracture I do think he fell and does not remember it The bruising is probably so bad because of the Xarelto Tylenol as needed for pain  Will follow up after xray, follow up with PCP as needed Webb Silversmith, NP

## 2016-04-21 NOTE — Patient Instructions (Signed)
Hematoma A hematoma is a collection of blood. The collection of blood can turn into a hard, painful lump under the skin. Your skin may turn blue or yellow if the hematoma is close to the surface of the skin. Most hematomas get better in a few days to weeks. Some hematomas are serious and need medical care. Hematomas can be very small or very big. Follow these instructions at home:  Apply ice to the injured area: ? Put ice in a plastic bag. ? Place a towel between your skin and the bag. ? Leave the ice on for 20 minutes, 2-3 times a day for the first 1 to 2 days.  After the first 2 days, switch to using warm packs on the injured area.  Raise (elevate) the injured area to lessen pain and puffiness (swelling). You may also wrap the area with an elastic bandage. Make sure the bandage is not wrapped too tight.  If you have a painful hematoma on your leg or foot, you may use crutches for a couple days.  Only take medicines as told by your doctor. Get help right away if:  Your pain gets worse.  Your pain is not controlled with medicine.  You have a fever.  Your puffiness gets worse.  Your skin turns more blue or yellow.  Your skin over the hematoma breaks or starts bleeding.  Your hematoma is in your chest or belly (abdomen) and you are short of breath, feel weak, or have a change in consciousness.  Your hematoma is on your scalp and you have a headache that gets worse or a change in alertness or consciousness. This information is not intended to replace advice given to you by your health care provider. Make sure you discuss any questions you have with your health care provider. Document Released: 04/07/2004 Document Revised: 08/06/2015 Document Reviewed: 08/08/2012 Elsevier Interactive Patient Education  2017 [employer] Inc.  

## 2016-04-21 NOTE — Telephone Encounter (Signed)
Okay, just good to clarify

## 2016-04-25 ENCOUNTER — Telehealth: Payer: Self-pay

## 2016-04-25 NOTE — Telephone Encounter (Signed)
Pt said only has 3 pills of Cartia; I called [facility] spoke with [place] and [place] was mailed to pt correct address on 04/11/16. Pt does not have anyone to help him with his meds. Pt will look for Cartia rx and will cb if cannot find bottle of med.

## 2016-04-26 ENCOUNTER — Ambulatory Visit: Payer: Medicare Other | Admitting: Internal Medicine

## 2016-05-09 ENCOUNTER — Encounter: Payer: Self-pay | Admitting: Podiatry

## 2016-05-09 ENCOUNTER — Encounter: Payer: Medicare Other | Admitting: Internal Medicine

## 2016-05-09 ENCOUNTER — Other Ambulatory Visit: Payer: Self-pay | Admitting: Internal Medicine

## 2016-05-09 ENCOUNTER — Ambulatory Visit (INDEPENDENT_AMBULATORY_CARE_PROVIDER_SITE_OTHER): Payer: Medicare Other | Admitting: Podiatry

## 2016-05-09 DIAGNOSIS — B351 Tinea unguium: Secondary | ICD-10-CM | POA: Diagnosis not present

## 2016-05-09 DIAGNOSIS — E0843 Diabetes mellitus due to underlying condition with diabetic autonomic (poly)neuropathy: Secondary | ICD-10-CM

## 2016-05-09 DIAGNOSIS — M79609 Pain in unspecified limb: Secondary | ICD-10-CM

## 2016-05-09 DIAGNOSIS — L84 Corns and callosities: Secondary | ICD-10-CM

## 2016-05-09 NOTE — Progress Notes (Signed)
Complaint:  Visit Type: Patient returns to my office for continued preventative foot care services. Complaint: Patient states" my nails have grown long and thick and become painful to walk and wear shoes" Patient has been diagnosed with DM with no foot complications. The patient presents for preventative foot care services. No changes to ROS  Podiatric Exam: Vascular: dorsalis pedis and posterior tibial pulses are palpable bilateral. Capillary return is immediate. Temperature gradient is WNL. Skin turgor WNL  Sensorium: Diminished  Semmes Weinstein monofilament test. Normal tactile sensation bilaterally. Nail Exam: Pt has thick disfigured discolored nails with subungual debris noted bilateral entire nail hallux through fifth toenails Ulcer Exam: There is no evidence of ulcer or pre-ulcerative changes or infection. Orthopedic Exam: Muscle tone and strength are WNL. No limitations in general ROM. No crepitus or effusions noted. Foot type and digits show no abnormalities. Bony prominences are unremarkable. Skin: No Porokeratosis. No infection or ulcers.  Distal clavi 3rd right foot  Diagnosis:  Onychomycosis, , Pain in right toe, pain in left toes,  Clavi 3rd right  Treatment & Plan Procedures and Treatment: Consent by patient was obtained for treatment procedures. The patient understood the discussion of treatment and procedures well. All questions were answered thoroughly reviewed. Debridement of mycotic and hypertrophic toenails, 1 through 5 bilateral and clearing of subungual debris. No ulceration, no infection noted. Debride clavi. Return Visit-Office Procedure: Patient instructed to return to the office for a follow up visit 3 months for continued evaluation and treatment.    Kassidee Narciso DPM 

## 2016-05-12 ENCOUNTER — Ambulatory Visit (INDEPENDENT_AMBULATORY_CARE_PROVIDER_SITE_OTHER): Payer: Medicare Other | Admitting: Internal Medicine

## 2016-05-12 ENCOUNTER — Encounter: Payer: Self-pay | Admitting: Internal Medicine

## 2016-05-12 VITALS — BP 158/64 | HR 76 | Temp 97.6°F | Ht 62.75 in | Wt 143.2 lb

## 2016-05-12 DIAGNOSIS — Z7189 Other specified counseling: Secondary | ICD-10-CM | POA: Diagnosis not present

## 2016-05-12 DIAGNOSIS — E1142 Type 2 diabetes mellitus with diabetic polyneuropathy: Secondary | ICD-10-CM | POA: Diagnosis not present

## 2016-05-12 DIAGNOSIS — I481 Persistent atrial fibrillation: Secondary | ICD-10-CM | POA: Diagnosis not present

## 2016-05-12 DIAGNOSIS — Z0000 Encounter for general adult medical examination without abnormal findings: Secondary | ICD-10-CM

## 2016-05-12 DIAGNOSIS — Z23 Encounter for immunization: Secondary | ICD-10-CM | POA: Diagnosis not present

## 2016-05-12 DIAGNOSIS — F0150 Vascular dementia without behavioral disturbance: Secondary | ICD-10-CM

## 2016-05-12 DIAGNOSIS — D696 Thrombocytopenia, unspecified: Secondary | ICD-10-CM | POA: Diagnosis not present

## 2016-05-12 DIAGNOSIS — Z0001 Encounter for general adult medical examination with abnormal findings: Secondary | ICD-10-CM

## 2016-05-12 DIAGNOSIS — I4819 Other persistent atrial fibrillation: Secondary | ICD-10-CM

## 2016-05-12 LAB — HM DIABETES FOOT EXAM

## 2016-05-12 LAB — HEMOGLOBIN A1C: HEMOGLOBIN A1C: 5.4 % (ref 4.6–6.5)

## 2016-05-12 MED ORDER — HYDROCHLOROTHIAZIDE 25 MG PO TABS: 25.0000 mg | ORAL_TABLET | Freq: Every day | ORAL | 3 refills | Status: DC

## 2016-05-12 NOTE — Addendum Note (Signed)
Addended by: Modena Nunnery on: 05/12/2016 03:41 PM   Modules accepted: Orders

## 2016-05-12 NOTE — Progress Notes (Signed)
Subjective:    Patient ID: Jimmy Riggs., male    DOB: 1924-10-09, 83 y.o.   MRN: [id number]  HPI Here for Medicare wellness and follow up of chronic health conditions Daughter is with him Reviewed form and advanced directives Reviewed other doctors---none Vision is okay---- no recent appt Some hearing loss--not too bad Occasional glass of wine--- 1 most days with dinner No tobacco Not exercising Recent fall with mild injury--he doesn't remember No depression or anhedonia Ongoing memory problems  Concerned about lump on left hip Been there for a year or more Remembers different testing to see what it is And it bothers him--though no pain  Reviewed the dementia Still drives locally Ongoing memory issues--- thinks he is well situated when daughter asks, then calls her later that he needs milk and other things Has run out of some meds--crestor and ?lisinopril (but he did ask for it and they were in the wrong) He handles all instrumental ADLs --cooking, etc Daughter has found expired food at times-but he has a system that usually works for him Daughter checks on him regularly by phone--at least one visit a year They monitor his finances and take him to all appointments  Checks sugars every day He forgot his list of numbers -- doesn't remember the numbers No hypoglycemic reactions Feet are somewhat numb but no sig pain  No chest pain No palpitations No SOB No edema No dizziness or syncope  Current Outpatient Prescriptions on File Prior to Visit  Medication Sig Dispense Refill  . CARTIA XT 120 MG 24 hr capsule TAKE 1 CAPSULE BY MOUTH  DAILY 90 capsule 1  . glucose blood (ONE TOUCH ULTRA TEST) test strip Use as instructed to test blood sugar once daily dx: E11.40 100 each 3  . Lancets (ONETOUCH ULTRASOFT) lancets Use as instructed to test blood sugar once daily dx: E11.49 100 each 3  . lisinopril (PRINIVIL,ZESTRIL) 20 MG tablet TAKE 1 TABLET BY MOUTH  DAILY 90 tablet 3    . Multiple Vitamin (MULTIVITAMIN) tablet Take 1 tablet by mouth daily.      . rosuvastatin (CRESTOR) 5 MG tablet TAKE 1 TABLET BY MOUTH  EVERY OTHER DAY 45 tablet 3  . triamcinolone cream (KENALOG) 0.1 % apply to affected area twice a day if needed 45 g 2  . XARELTO 20 MG TABS tablet TAKE 1 TABLET BY MOUTH ONCE DAILY WITH SUPPER 90 tablet 3   No current facility-administered medications on file prior to visit.     No Known Allergies  Past Medical History:  Diagnosis Date  . Acute intra-cranial hemorrhage (HCC)    w/ coumadin  . Atrial fibrillation ([place])   . Benign prostatic hypertrophy   . Coronary artery disease   . Hyperlipidemia   . Hypertension   . Inguinal hernia   . Lymphoma ([place])   . Melanoma in situ of shoulder ([place])   . NIDDM (non-insulin dependent diabetes mellitus)   . Osteoarthritis   . S/P tonsillectomy and adenoidectomy 1931  . Thrombocytopenia ([place])     Past Surgical History:  Procedure Laterality Date  . CATARACT EXTRACTION    . CHOLECYSTECTOMY    . TONSILLECTOMY AND ADENOIDECTOMY      Family History  Problem Relation Age of Onset  . Heart failure Mother   . Diabetes Mother   . Hypertension Mother   . Prostate cancer Father   . Arthritis Sister     Social History   Social History  . Marital  status: Widowed    Spouse name: N/A  . Number of children: 3  . Years of education: N/A   Occupational History  . retired- Art gallery manager Retired   Social History Main Topics  . Smoking status: Former Smoker    Packs/day: 0.00    Years: 20.00    Quit date: 03/14/1980  . Smokeless tobacco: Never Used  . Alcohol use Yes  . Drug use: No  . Sexual activity: Not on file   Other Topics Concern  . Not on file   Social History Narrative   Wife died 2022/05/29      Has living will   Son Eddie Dibbles is health care POA   Has DNR order   No tube feedings if cognitively unaware   Review of Systems  Not a big eater--weight stable Generally sleeps okay Teeth  are okay--keeps up with dentist Toy Cookey) No back or joint pain Wears seat belt Bowels are fine. No blood Voids okay. Occasional nocturia. Reasonable stream No skin lesions     Objective:   Physical Exam  Constitutional: He appears well-nourished. No distress.  HENT:  Mouth/Throat: Oropharynx is clear and moist. No oropharyngeal exudate.  Neck: No thyromegaly present.  Cardiovascular: Normal rate, normal heart sounds and intact distal pulses.  Exam reveals no gallop.   No murmur heard. irregular  Pulmonary/Chest: Breath sounds normal. No respiratory distress. He has no wheezes. He has no rales.  Abdominal: Soft. There is no tenderness.  Musculoskeletal:  Firm mass on posterior left hip--has overlying bruise (not a distinct mass)  Lymphadenopathy:    He has no cervical adenopathy.  Neurological:  Decreased sensation in feet  Skin:  Mycotic toenails and scaly feet. No ulcers  Psychiatric:  Slightly belligerent about the hip lump (swears it has been theree over a year but seems like recent hematoma          Assessment & Plan:

## 2016-05-12 NOTE — Assessment & Plan Note (Signed)
Good rate control On xarelto 

## 2016-05-12 NOTE — Assessment & Plan Note (Signed)
Mild Unlikely to have added to bleeding with recent fall/hematoma

## 2016-05-12 NOTE — Assessment & Plan Note (Signed)
Holding on at home--but with supervision Reluctant to accept more services

## 2016-05-12 NOTE — Assessment & Plan Note (Signed)
I have personally reviewed the Medicare Annual Wellness questionnaire and have noted 1. The patient's medical and social history 2. Their use of alcohol, tobacco or illicit drugs 3. Their current medications and supplements 4. The patient's functional ability including ADL's, fall risks, home safety risks and hearing or visual             impairment. 5. Diet and physical activities 6. Evidence for depression or mood disorders  The patients weight, height, BMI and visual acuity have been recorded in the chart I have made referrals, counseling and provided education to the patient based review of the above and I have provided the pt with a written personalized care plan for preventive services.  I have provided you with a copy of your personalized plan for preventive services. Please take the time to review along with your updated medication list.  Recommended update of pneumovax No cancer screening due to age Discussed safety

## 2016-05-12 NOTE — Progress Notes (Signed)
Pre visit review using our clinic review tool, if applicable. No additional management support is needed unless otherwise documented below in the visit note. 

## 2016-05-12 NOTE — Assessment & Plan Note (Signed)
Told him he doesn't need to check sugars so often--especially since he doesn't remember them Diet only Will check A1c

## 2016-05-12 NOTE — Assessment & Plan Note (Signed)
Has DNR 

## 2016-05-13 ENCOUNTER — Encounter: Payer: Self-pay | Admitting: *Deleted

## 2016-05-16 ENCOUNTER — Encounter: Payer: Self-pay | Admitting: Family Medicine

## 2016-05-16 ENCOUNTER — Ambulatory Visit (INDEPENDENT_AMBULATORY_CARE_PROVIDER_SITE_OTHER): Payer: Medicare Other | Admitting: Family Medicine

## 2016-05-16 ENCOUNTER — Telehealth: Payer: Self-pay | Admitting: Internal Medicine

## 2016-05-16 VITALS — BP 150/64 | HR 82 | Temp 97.6°F | Wt 143.0 lb

## 2016-05-16 DIAGNOSIS — R58 Hemorrhage, not elsewhere classified: Secondary | ICD-10-CM | POA: Diagnosis not present

## 2016-05-16 DIAGNOSIS — M7989 Other specified soft tissue disorders: Secondary | ICD-10-CM | POA: Diagnosis not present

## 2016-05-16 DIAGNOSIS — M79602 Pain in left arm: Secondary | ICD-10-CM | POA: Diagnosis not present

## 2016-05-16 LAB — CBC WITH DIFFERENTIAL/PLATELET
BASOS PCT: 0.6 % (ref 0.0–3.0)
Basophils Absolute: 0 10*3/uL (ref 0.0–0.1)
EOS PCT: 0.8 % (ref 0.0–5.0)
Eosinophils Absolute: 0 10*3/uL (ref 0.0–0.7)
HCT: 38.2 % — ABNORMAL LOW (ref 39.0–52.0)
Hemoglobin: 13.3 g/dL (ref 13.0–17.0)
LYMPHS ABS: 1.2 10*3/uL (ref 0.7–4.0)
Lymphocytes Relative: 26.6 % (ref 12.0–46.0)
MCHC: 34.8 g/dL (ref 30.0–36.0)
MCV: 94.6 fl (ref 78.0–100.0)
MONO ABS: 0.8 10*3/uL (ref 0.1–1.0)
Monocytes Relative: 17.7 % — ABNORMAL HIGH (ref 3.0–12.0)
NEUTROS PCT: 54.3 % (ref 43.0–77.0)
Neutro Abs: 2.5 10*3/uL (ref 1.4–7.7)
Platelets: 137 10*3/uL — ABNORMAL LOW (ref 150.0–400.0)
RBC: 4.04 Mil/uL — ABNORMAL LOW (ref 4.22–5.81)
RDW: 13.9 % (ref 11.5–15.5)
WBC: 4.6 10*3/uL (ref 4.0–10.5)

## 2016-05-16 NOTE — Telephone Encounter (Signed)
PLEASE NOTE: All timestamps contained within this report are represented as [place] Standard Time. CONFIDENTIALTY NOTICE: This fax transmission is intended only for the addressee. It contains information that is legally privileged, confidential or otherwise protected from use or disclosure. If you are not the intended recipient, you are strictly prohibited from reviewing, disclosing, copying using or disseminating any of this information or taking any action in reliance on or regarding this information. If you have received this fax in error, please notify us immediately by telephone so that we can arrange for its return to [place]. Phone: [phone], Toll-Free: [phone], Fax: [phone] Page: 1 of 1 Call Id: [id number] [place] Patient Name: Jimmy Riggs Gender: Male DOB: 1924-07-15 Age: 83 Y 19 D Return Phone Number: [phone] (Primary) City/State/Zip: [place] Client [place] Night - Client Client Site [place] Physician Viviana Simpler - MD Who Is Calling Patient / Member / Family / Caregiver Call Type Triage / Clinical Relationship To Patient Self Return Phone Number [phone] (Primary) Chief Complaint Arm Pain (no known cause) Reason for Call Symptomatic / Request for [place] states he had a Shot, today his hand and arm are swollen and red Nurse Assessment Guidelines Guideline Title Affirmed Question Disp. Time Eilene Ghazi Time) Disposition Final User 05/15/2016 9:55:15 AM FINAL ATTEMPT MADE - no message left Yes Mills-Hernandez, RN, Izora Gala

## 2016-05-16 NOTE — Telephone Encounter (Signed)
Patient Name: Jimmy Riggs  DOB: 08-Feb-1925    Initial Comment CBWN Caller phoned back stating arm/hand is red/swollen/hot to the touch. - Caller says father in law got a pneumonia vaccine, now has swelling from the injection site down to hand, which is also swollen and red    Nurse Assessment      Guidelines    Guideline Title Affirmed Question Affirmed Notes       Final Disposition User   Clinical Call [place], RN, Heather    Comments  He has an 11:15 appt this morning at the office

## 2016-05-16 NOTE — Progress Notes (Signed)
Pre visit review using our clinic review tool, if applicable. No additional management support is needed unless otherwise documented below in the visit note. 

## 2016-05-16 NOTE — Telephone Encounter (Signed)
Pt has appt with Glenda Chroman FNP 05/16/16 at 11:15.

## 2016-05-16 NOTE — Progress Notes (Signed)
Subjective:    Patient ID: Jimmy Riggs., male    DOB: May 28, 1924, 83 y.o.   MRN: [id number]  HPI This is a 83 yo male accompanied by his daughter in law, Juliann Pulse. He received pneumococcal 23 4 days ago and has developed pain, discoloration and swelling of his left arm where he had the injection. Upper arm turned yellow the day injection was administered, has become slightly darker and spread toward his elbow. Lower arm and wrist were swollen yesterday, now hand is puffy and swollen. No known fever. No chest pain, no SOB, no cough or recent falls. He takes Xarelto for his atrial fib, but has been out for last 3 days.   Past Medical History:  Diagnosis Date  . Acute intra-cranial hemorrhage (HCC)    w/ coumadin  . Atrial fibrillation ([place])   . Benign prostatic hypertrophy   . Coronary artery disease   . Hyperlipidemia   . Hypertension   . Inguinal hernia   . Lymphoma ([place])   . Melanoma in situ of shoulder ([place])   . NIDDM (non-insulin dependent diabetes mellitus)   . Osteoarthritis   . S/P tonsillectomy and adenoidectomy 1931  . Thrombocytopenia ([place])    Past Surgical History:  Procedure Laterality Date  . CATARACT EXTRACTION    . CHOLECYSTECTOMY    . TONSILLECTOMY AND ADENOIDECTOMY     Family History  Problem Relation Age of Onset  . Heart failure Mother   . Diabetes Mother   . Hypertension Mother   . Prostate cancer Father   . Arthritis Sister    Social History  Substance Use Topics  . Smoking status: Former Smoker    Packs/day: 0.00    Years: 20.00    Quit date: 03/14/1980  . Smokeless tobacco: Never Used  . Alcohol use Yes      Review of Systems Per HPI    Objective:   Physical Exam  Constitutional: He appears well-developed and well-nourished. No distress.  Appears stated age.   HENT:  Head: Normocephalic and atraumatic.  Eyes: Conjunctivae are normal.  Cardiovascular: Normal rate.   Pulmonary/Chest: Effort normal.  Musculoskeletal:       Left upper  arm: He exhibits tenderness (over deltoid.).  No joint pain with palpation. Upper arm with generalized ecchymosis, hand with moderate amount of swelling. Elbow, wrist with normal ROM. Radial pulse +2, brisk cap refill.   Skin: Skin is warm and dry. He is not diaphoretic.  Psychiatric: He has a normal mood and affect. His behavior is normal. Judgment and thought content normal.  Vitals reviewed.     BP (!) 150/64 (BP Location: Right Arm, Patient Position: Sitting, Cuff Size: Normal)   Pulse 82   Temp 97.6 F (36.4 C) (Oral)   Wt 143 lb (64.9 kg)   SpO2 98%   BMI 25.53 kg/m  Wt Readings from Last 3 Encounters:  05/16/16 143 lb (64.9 kg)  05/12/16 143 lb 4 oz (65 kg)  04/21/16 144 lb (65.3 kg)   BP Readings from Last 3 Encounters:  05/16/16 (!) 150/64  05/12/16 (!) 158/64  04/21/16 128/74       Assessment & Plan:  Discussed with Dr. Damita Dunnings who also examined patient 1. Left arm swelling - reaction following injection, swelling decreasing, not very painful today - CBC with Differential/Platelet -  Patient Instructions  Keep your arm elevated as much as possible to help with swelling You can apply moist heat as needed and take Tylenol  2. Left  arm pain - CBC with Differential/Platelet  3. Ecchymosis - CBC with Differential/Platelet  Clarene Reamer, FNP-BC  [facility]  05/17/2016 2:43 PM

## 2016-05-16 NOTE — Patient Instructions (Addendum)
Keep your arm elevated as much as possible to help with swelling You can apply moist heat as needed and take Tylenol

## 2016-06-22 DIAGNOSIS — L570 Actinic keratosis: Secondary | ICD-10-CM | POA: Diagnosis not present

## 2016-06-22 DIAGNOSIS — D1800 Hemangioma unspecified site: Secondary | ICD-10-CM | POA: Diagnosis not present

## 2016-06-22 DIAGNOSIS — Z85828 Personal history of other malignant neoplasm of skin: Secondary | ICD-10-CM | POA: Diagnosis not present

## 2016-06-22 DIAGNOSIS — B353 Tinea pedis: Secondary | ICD-10-CM | POA: Diagnosis not present

## 2016-06-22 DIAGNOSIS — L578 Other skin changes due to chronic exposure to nonionizing radiation: Secondary | ICD-10-CM | POA: Diagnosis not present

## 2016-06-22 DIAGNOSIS — L821 Other seborrheic keratosis: Secondary | ICD-10-CM | POA: Diagnosis not present

## 2016-06-22 DIAGNOSIS — Z1283 Encounter for screening for malignant neoplasm of skin: Secondary | ICD-10-CM | POA: Diagnosis not present

## 2016-06-22 DIAGNOSIS — Z85820 Personal history of malignant melanoma of skin: Secondary | ICD-10-CM | POA: Diagnosis not present

## 2016-06-22 DIAGNOSIS — D229 Melanocytic nevi, unspecified: Secondary | ICD-10-CM | POA: Diagnosis not present

## 2016-06-22 DIAGNOSIS — L812 Freckles: Secondary | ICD-10-CM | POA: Diagnosis not present

## 2016-07-18 ENCOUNTER — Other Ambulatory Visit: Payer: Self-pay | Admitting: Internal Medicine

## 2016-08-15 ENCOUNTER — Encounter: Payer: Self-pay | Admitting: Podiatry

## 2016-08-15 ENCOUNTER — Ambulatory Visit (INDEPENDENT_AMBULATORY_CARE_PROVIDER_SITE_OTHER): Payer: Medicare Other | Admitting: Podiatry

## 2016-08-15 DIAGNOSIS — L84 Corns and callosities: Secondary | ICD-10-CM

## 2016-08-15 DIAGNOSIS — M79609 Pain in unspecified limb: Secondary | ICD-10-CM

## 2016-08-15 DIAGNOSIS — B351 Tinea unguium: Secondary | ICD-10-CM | POA: Diagnosis not present

## 2016-08-15 DIAGNOSIS — E1142 Type 2 diabetes mellitus with diabetic polyneuropathy: Secondary | ICD-10-CM

## 2016-08-15 NOTE — Progress Notes (Signed)
Complaint:  Visit Type: Patient returns to my office for continued preventative foot care services. Complaint: Patient states" my nails have grown long and thick and become painful to walk and wear shoes" Patient has been diagnosed with DM with no foot complications. The patient presents for preventative foot care services. No changes to ROS  Podiatric Exam: Vascular: dorsalis pedis and posterior tibial pulses are palpable bilateral. Capillary return is immediate. Temperature gradient is WNL. Skin turgor WNL  Sensorium: Diminished  Semmes Weinstein monofilament test. Normal tactile sensation bilaterally. Nail Exam: Pt has thick disfigured discolored nails with subungual debris noted bilateral entire nail hallux through fifth toenails Ulcer Exam: There is no evidence of ulcer or pre-ulcerative changes or infection. Orthopedic Exam: Muscle tone and strength are WNL. No limitations in general ROM. No crepitus or effusions noted. Foot type and digits show no abnormalities. Bony prominences are unremarkable. Skin: No Porokeratosis. No infection or ulcers.  Distal clavi 3rd right foot  Diagnosis:  Onychomycosis, , Pain in right toe, pain in left toes,  Clavi 3rd right  Treatment & Plan Procedures and Treatment: Consent by patient was obtained for treatment procedures. The patient understood the discussion of treatment and procedures well. All questions were answered thoroughly reviewed. Debridement of mycotic and hypertrophic toenails, 1 through 5 bilateral and clearing of subungual debris. No ulceration, no infection noted. Debride clavi. Return Visit-Office Procedure: Patient instructed to return to the office for a follow up visit 3 months for continued evaluation and treatment.    Reiss Mowrey DPM 

## 2016-10-27 ENCOUNTER — Telehealth: Payer: Self-pay

## 2016-10-27 NOTE — Telephone Encounter (Signed)
Spoke to [place]. She is keeping a journal of episodes of forgetfulness.

## 2016-10-27 NOTE — Telephone Encounter (Signed)
It is good she is coming. He does need more help but has not accepted  He should be taking the cartia

## 2016-10-27 NOTE — Telephone Encounter (Signed)
Jimmy Riggs (DPR signed) pt has appt 11/17/16 and memory and comptency is at question; at 6 mth f/u request ck cognitive testing to see if pt needs to be in more controlled environment rather than independent living at [facility]. Jimmy Riggs will come to the 11/17/16 appt. Jimmy Riggs does not need cb just FYI to Dr Silvio Pate. Also Jimmy Riggs spoke with [facility] this morning and they had no refills for cartia XT and wanted to know if pt should be taking. Cathy said [place] XT was delivered to pt this afternoon and will continue that med.

## 2016-11-17 ENCOUNTER — Encounter: Payer: Self-pay | Admitting: Internal Medicine

## 2016-11-17 ENCOUNTER — Ambulatory Visit (INDEPENDENT_AMBULATORY_CARE_PROVIDER_SITE_OTHER): Payer: Medicare Other | Admitting: Internal Medicine

## 2016-11-17 VITALS — BP 130/82 | HR 66 | Temp 97.3°F | Wt 133.0 lb

## 2016-11-17 DIAGNOSIS — I10 Essential (primary) hypertension: Secondary | ICD-10-CM | POA: Diagnosis not present

## 2016-11-17 DIAGNOSIS — E1142 Type 2 diabetes mellitus with diabetic polyneuropathy: Secondary | ICD-10-CM

## 2016-11-17 DIAGNOSIS — Z23 Encounter for immunization: Secondary | ICD-10-CM

## 2016-11-17 DIAGNOSIS — I481 Persistent atrial fibrillation: Secondary | ICD-10-CM

## 2016-11-17 DIAGNOSIS — E441 Mild protein-calorie malnutrition: Secondary | ICD-10-CM | POA: Diagnosis not present

## 2016-11-17 DIAGNOSIS — I4819 Other persistent atrial fibrillation: Secondary | ICD-10-CM

## 2016-11-17 DIAGNOSIS — F0150 Vascular dementia without behavioral disturbance: Secondary | ICD-10-CM | POA: Diagnosis not present

## 2016-11-17 NOTE — Assessment & Plan Note (Signed)
Probably still well controlled without meds

## 2016-11-17 NOTE — Assessment & Plan Note (Signed)
Asked him to start daily delivered meals and supplements

## 2016-11-17 NOTE — Assessment & Plan Note (Signed)
Rate is fine Still on xarelto

## 2016-11-17 NOTE — Progress Notes (Signed)
Subjective:    Patient ID: Jimmy Riggs., male    DOB: Sep 08, 1924, 83 y.o.   MRN: [id number]  HPI Here with DIL for follow up of diabetes and memory issues  See social worker's note Still drives--but very limited ---- inside [facility] brings him to all appointments and grocery store DIL there as often as 4 days a week and calls daily (reminding him to take meds--but often doubles up anyway, forgetting which day it is) Low effort meals--but eating small amounts No significant social interaction--only out for dinner once a week with son and DIL  Checks sugars every day Thinks they are okay--but doesn't remember No foot sores or pain  No depression No anxiety He is aware of his memory problems  Current Outpatient Prescriptions on File Prior to Visit  Medication Sig Dispense Refill  . CARTIA XT 120 MG 24 hr capsule TAKE 1 CAPSULE BY MOUTH  DAILY 90 capsule 1  . glucose blood (ONE TOUCH ULTRA TEST) test strip Use as instructed to test blood sugar once daily dx: E11.40 100 each 3  . hydrochlorothiazide (HYDRODIURIL) 25 MG tablet Take 1 tablet (25 mg total) by mouth daily. 90 tablet 3  . Lancets (ONETOUCH ULTRASOFT) lancets Use as instructed to test blood sugar once daily dx: E11.49 100 each 3  . lisinopril (PRINIVIL,ZESTRIL) 20 MG tablet TAKE 1 TABLET BY MOUTH  DAILY 90 tablet 3  . Multiple Vitamin (MULTIVITAMIN) tablet Take 1 tablet by mouth daily.      . rosuvastatin (CRESTOR) 5 MG tablet TAKE 1 TABLET BY MOUTH  EVERY OTHER DAY 45 tablet 3  . triamcinolone cream (KENALOG) 0.1 % apply to affected area twice a day if needed 45 g 2  . XARELTO 20 MG TABS tablet TAKE 1 TABLET BY MOUTH ONCE DAILY WITH SUPPER 90 tablet 3   No current facility-administered medications on file prior to visit.     No Known Allergies  Past Medical History:  Diagnosis Date  . Acute intra-cranial hemorrhage (HCC)    w/ coumadin  . Atrial fibrillation ([place])   . Benign prostatic hypertrophy   .  Coronary artery disease   . Hyperlipidemia   . Hypertension   . Inguinal hernia   . Lymphoma ([place])   . Melanoma in situ of shoulder ([place])   . NIDDM (non-insulin dependent diabetes mellitus)   . Osteoarthritis   . S/P tonsillectomy and adenoidectomy 1931  . Thrombocytopenia ([place])     Past Surgical History:  Procedure Laterality Date  . CATARACT EXTRACTION    . CHOLECYSTECTOMY    . TONSILLECTOMY AND ADENOIDECTOMY      Family History  Problem Relation Age of Onset  . Heart failure Mother   . Diabetes Mother   . Hypertension Mother   . Prostate cancer Father   . Arthritis Sister     Social History   Social History  . Marital status: Widowed    Spouse name: N/A  . Number of children: 3  . Years of education: N/A   Occupational History  . retired- Art gallery manager Retired   Social History Main Topics  . Smoking status: Former Smoker    Packs/day: 0.00    Years: 20.00    Quit date: 03/14/1980  . Smokeless tobacco: Never Used  . Alcohol use Yes  . Drug use: No  . Sexual activity: Not on file   Other Topics Concern  . Not on file   Social History Narrative   Wife  died 3/14      Has living will   Son Eddie Dibbles is health care POA   Has DNR order   No tube feedings if cognitively unaware   Review of Systems Weight is down 10# in past 6 months Sleeps okay Some knee pain--no meds No abnormal bleeding    Objective:   Physical Exam  Constitutional: No distress.  Can tell he has lost some weight  Cardiovascular: Normal rate, normal heart sounds and intact distal pulses.  Exam reveals no gallop.   No murmur heard. Slightly irregular Faint pedal pulses  Pulmonary/Chest: Effort normal and breath sounds normal. No respiratory distress. He has no wheezes. He has no rales.  Musculoskeletal: He exhibits no edema.  Neurological:  Still has normal interaction with me though clear memory deficits  Skin:  No foot ulcers--- callous under right 1st MTP            Assessment & Plan:

## 2016-11-17 NOTE — Addendum Note (Signed)
Addended by: Pilar Grammes on: 11/17/2016 05:04 PM   Modules accepted: Orders

## 2016-11-17 NOTE — Assessment & Plan Note (Signed)
Having some functional decline Weight loss, etc Confuses meds Recommended change to AL when room available--he is resistant Advised to give up car Get daily meal from [facility]

## 2016-11-17 NOTE — Assessment & Plan Note (Signed)
BP Readings from Last 3 Encounters:  11/17/16 130/82  05/16/16 (!) 150/64  05/12/16 (!) 158/64   Good control

## 2016-11-18 LAB — HEMOGLOBIN A1C: HEMOGLOBIN A1C: 5.6 % (ref 4.6–6.5)

## 2016-11-18 LAB — LIPID PANEL
CHOLESTEROL: 103 mg/dL (ref 0–200)
HDL: 49.6 mg/dL (ref >39.00)
LDL Cholesterol: 34 mg/dL (ref 0–99)
NonHDL: 53.29
Total CHOL/HDL Ratio: 2
Triglycerides: 97 mg/dL (ref 0.0–149.0)
VLDL: 19.4 mg/dL (ref 0.0–40.0)

## 2016-11-18 LAB — COMPREHENSIVE METABOLIC PANEL
ALBUMIN: 4.8 g/dL (ref 3.5–5.2)
ALK PHOS: 40 U/L (ref 39–117)
ALT: 11 U/L (ref 0–53)
AST: 20 U/L (ref 0–37)
BUN: 9 mg/dL (ref 6–23)
CALCIUM: 9.3 mg/dL (ref 8.4–10.5)
CO2: 29 mEq/L (ref 19–32)
Chloride: 88 mEq/L — ABNORMAL LOW (ref 96–112)
Creatinine, Ser: 0.68 mg/dL (ref 0.40–1.50)
GFR: 116.29 mL/min (ref >60.00)
Glucose, Bld: 97 mg/dL (ref 70–99)
POTASSIUM: 4.1 meq/L (ref 3.5–5.1)
SODIUM: 125 meq/L — AB (ref 135–145)
TOTAL PROTEIN: 6.6 g/dL (ref 6.0–8.3)
Total Bilirubin: 0.9 mg/dL (ref 0.2–1.2)

## 2016-11-28 ENCOUNTER — Ambulatory Visit (INDEPENDENT_AMBULATORY_CARE_PROVIDER_SITE_OTHER): Payer: Medicare Other | Admitting: Podiatry

## 2016-11-28 DIAGNOSIS — L84 Corns and callosities: Secondary | ICD-10-CM

## 2016-11-28 DIAGNOSIS — E1142 Type 2 diabetes mellitus with diabetic polyneuropathy: Secondary | ICD-10-CM | POA: Diagnosis not present

## 2016-11-28 DIAGNOSIS — B351 Tinea unguium: Secondary | ICD-10-CM

## 2016-11-28 DIAGNOSIS — M79609 Pain in unspecified limb: Secondary | ICD-10-CM | POA: Diagnosis not present

## 2016-11-28 NOTE — Progress Notes (Signed)
Complaint:  Visit Type: Patient returns to my office for continued preventative foot care services. Complaint: Patient states" my nails have grown long and thick and become painful to walk and wear shoes" Patient has been diagnosed with DM with no foot complications. The patient presents for preventative foot care services. No changes to ROS  Podiatric Exam: Vascular: dorsalis pedis and posterior tibial pulses are palpable bilateral. Capillary return is immediate. Temperature gradient is WNL. Skin turgor WNL  Sensorium: Diminished  Semmes Weinstein monofilament test. Normal tactile sensation bilaterally. Nail Exam: Pt has thick disfigured discolored nails with subungual debris noted bilateral entire nail hallux through fifth toenails Ulcer Exam: There is no evidence of ulcer or pre-ulcerative changes or infection. Orthopedic Exam: Muscle tone and strength are WNL. No limitations in general ROM. No crepitus or effusions noted. Foot type and digits show no abnormalities. Bony prominences are unremarkable. Skin: No Porokeratosis. No infection or ulcers.  Distal clavi 3rd right foot  Diagnosis:  Onychomycosis, , Pain in right toe, pain in left toes,  Clavi 3rd right  Treatment & Plan Procedures and Treatment: Consent by patient was obtained for treatment procedures. The patient understood the discussion of treatment and procedures well. All questions were answered thoroughly reviewed. Debridement of mycotic and hypertrophic toenails, 1 through 5 bilateral and clearing of subungual debris. No ulceration, no infection noted. Debride clavi. Return Visit-Office Procedure: Patient instructed to return to the office for a follow up visit 3 months for continued evaluation and treatment.    Gardiner Barefoot DPM

## 2016-12-10 ENCOUNTER — Other Ambulatory Visit: Payer: Self-pay | Admitting: Internal Medicine

## 2016-12-17 ENCOUNTER — Other Ambulatory Visit: Payer: Self-pay | Admitting: Internal Medicine

## 2016-12-19 ENCOUNTER — Telehealth: Payer: Self-pay

## 2016-12-19 NOTE — Telephone Encounter (Signed)
This did not come to my desk top and I just saw the note; Dr Silvio Pate has gone and Larene [place] said she believes Dr Silvio Pate is aware of this.

## 2016-12-19 NOTE — Telephone Encounter (Signed)
PLEASE NOTE: All timestamps contained within this report are represented as [place] Standard Time. CONFIDENTIALTY NOTICE: This fax transmission is intended only for the addressee. It contains information that is legally privileged, confidential or otherwise protected from use or disclosure. If you are not the intended recipient, you are strictly prohibited from reviewing, disclosing, copying using or disseminating any of this information or taking any action in reliance on or regarding this information. If you have received this fax in error, please notify us immediately by telephone so that we can arrange for its return to [place]. Phone: [phone], Toll-Free: [phone], Fax: [phone] Page: 1 of 1 Call Id: [id number] Jimmy Riggs - Client Nonclinical Telephone Record Jimmy Riggs - Client Client Site [place] - Riggs Physician Viviana Simpler - MD Contact Type Call Who Is Calling Patient / Member / Family / Caregiver Caller Name [name] Phone Number [phone] Patient Name Jimmy Riggs Call Type Message Only Information Provided Reason for Call Request to Schedule Office Appointment Initial Comment Tye Maryland calling in reference to Yoe. Declining rapidly, mini strokes, disoriented, unsteady of feet. Caregiver coming in, refusing to get up. Last 4-6 weeks declining more and more. Call Closed By: Brendolyn Patty Transaction Date/Time: 12/19/2016 4:06:26 PM (ET)

## 2016-12-20 ENCOUNTER — Ambulatory Visit (INDEPENDENT_AMBULATORY_CARE_PROVIDER_SITE_OTHER): Payer: Medicare Other | Admitting: Internal Medicine

## 2016-12-20 ENCOUNTER — Encounter: Payer: Self-pay | Admitting: Internal Medicine

## 2016-12-20 VITALS — BP 120/84 | HR 75 | Temp 97.3°F | Wt 130.0 lb

## 2016-12-20 DIAGNOSIS — Z23 Encounter for immunization: Secondary | ICD-10-CM | POA: Diagnosis not present

## 2016-12-20 DIAGNOSIS — F0150 Vascular dementia without behavioral disturbance: Secondary | ICD-10-CM

## 2016-12-20 DIAGNOSIS — N401 Enlarged prostate with lower urinary tract symptoms: Secondary | ICD-10-CM

## 2016-12-20 DIAGNOSIS — I4819 Other persistent atrial fibrillation: Secondary | ICD-10-CM

## 2016-12-20 DIAGNOSIS — I481 Persistent atrial fibrillation: Secondary | ICD-10-CM | POA: Diagnosis not present

## 2016-12-20 DIAGNOSIS — N138 Other obstructive and reflux uropathy: Secondary | ICD-10-CM | POA: Diagnosis not present

## 2016-12-20 DIAGNOSIS — E1142 Type 2 diabetes mellitus with diabetic polyneuropathy: Secondary | ICD-10-CM | POA: Diagnosis not present

## 2016-12-20 LAB — POC URINALSYSI DIPSTICK (AUTOMATED)
Bilirubin, UA: NEGATIVE
Blood, UA: NEGATIVE
GLUCOSE UA: NEGATIVE
NITRITE UA: NEGATIVE
PROTEIN UA: 15
SPEC GRAV UA: 1.02 (ref 1.010–1.025)
Urobilinogen, UA: 1 E.U./dL
pH, UA: 6 (ref 5.0–8.0)

## 2016-12-20 NOTE — Progress Notes (Signed)
Subjective:    Patient ID: Jimmy Phillips., male    DOB: Oct 03, 1924, 83 y.o.   MRN: [id number]  HPI Here with son, Jimmy Riggs for worsening condition  He denies any problem Had fall--see email from [employer] from [place] on floor this morning--looks like he didn't sleep in bed Didn't activate his pendant  Called 911 two nights ago Worried about a low sugar reaction  DIL continues to monitor his status Still not eating great Has lost some more weight  Keys are gone--not driving  Spent a long time in bathroom yesterday---didn't seem to void Past prostate surgery He does drink a lot --- especially diet Coke He feels he is voiding okay No dysuria or hematuria He did go earlier today--son saw urine in toilet  Waiting list for memory care and AL  Current Outpatient Prescriptions on File Prior to Visit  Medication Sig Dispense Refill  . diltiazem (CARDIZEM CD) 120 MG 24 hr capsule TAKE 1 CAPSULE BY MOUTH  DAILY 90 capsule 1  . glucose blood (ONE TOUCH ULTRA TEST) test strip Use as instructed to test blood sugar once daily dx: E11.40 100 each 3  . hydrochlorothiazide (HYDRODIURIL) 25 MG tablet Take 1 tablet (25 mg total) by mouth daily. 90 tablet 3  . Lancets (ONETOUCH ULTRASOFT) lancets Use as instructed to test blood sugar once daily dx: E11.49 100 each 3  . lisinopril (PRINIVIL,ZESTRIL) 20 MG tablet TAKE 1 TABLET BY MOUTH  DAILY 90 tablet 3  . Multiple Vitamin (MULTIVITAMIN) tablet Take 1 tablet by mouth daily.      . rosuvastatin (CRESTOR) 5 MG tablet TAKE 1 TABLET BY MOUTH  EVERY OTHER DAY 45 tablet 3  . triamcinolone cream (KENALOG) 0.1 % apply to affected area twice a day if needed 45 g 2  . XARELTO 20 MG TABS tablet TAKE 1 TABLET BY MOUTH ONCE DAILY WITH SUPPER 90 tablet 3   No current facility-administered medications on file prior to visit.     No Known Allergies  Past Medical History:  Diagnosis Date  . Acute intra-cranial hemorrhage (HCC)    w/ coumadin  .  Atrial fibrillation ([place])   . Benign prostatic hypertrophy   . Coronary artery disease   . Hyperlipidemia   . Hypertension   . Inguinal hernia   . Lymphoma ([place])   . Melanoma in situ of shoulder ([place])   . NIDDM (non-insulin dependent diabetes mellitus)   . Osteoarthritis   . S/P tonsillectomy and adenoidectomy 1931  . Thrombocytopenia ([place])     Past Surgical History:  Procedure Laterality Date  . CATARACT EXTRACTION    . CHOLECYSTECTOMY    . TONSILLECTOMY AND ADENOIDECTOMY      Family History  Problem Relation Age of Onset  . Heart failure Mother   . Diabetes Mother   . Hypertension Mother   . Prostate cancer Father   . Arthritis Sister     Social History   Social History  . Marital status: Widowed    Spouse name: N/A  . Number of children: 3  . Years of education: N/A   Occupational History  . retired- Art gallery manager Retired   Social History Main Topics  . Smoking status: Former Smoker    Packs/day: 0.00    Years: 20.00    Quit date: 03/14/1980  . Smokeless tobacco: Never Used  . Alcohol use Yes  . Drug use: No  . Sexual activity: Not on file   Other Topics Concern  .  Not on file   Social History Narrative   Wife died June 21, 2022      Has living will   Son Jimmy Riggs is health care POA   Has DNR order   No tube feedings if cognitively unaware   Review of Systems No fever He states he feels okay    Objective:   Physical Exam  Constitutional: No distress.  Neck: No thyromegaly present.  Cardiovascular: Normal rate and normal heart sounds.  Exam reveals no gallop.   No murmur heard. irregular  Pulmonary/Chest: Effort normal and breath sounds normal. No respiratory distress. He has no wheezes. He has no rales.  Abdominal: Soft. He exhibits no distension. There is no tenderness.  No suprapubic dullness  Lymphadenopathy:    He has no cervical adenopathy.  Neurological:  Distant--busy fiddling with tissue and only engages when I directly ask him  questions          Assessment & Plan:

## 2016-12-20 NOTE — Assessment & Plan Note (Addendum)
Clearly worse No clear indication of new stroke--though clearly less stable with his walking Absolutely must move to AL or memory care--unequivocally stated this to him and son For now, increase aides to bid and increase time as much as possible Therapy will start with him as well--PT and OT

## 2016-12-20 NOTE — Assessment & Plan Note (Signed)
No symptoms 

## 2016-12-20 NOTE — Assessment & Plan Note (Signed)
Slow stream but no specific signs of infection Urinalysis basically benign--- 1.020, no glucose but concentrated with some ketones. Slight leuk only No indication for treatment

## 2016-12-20 NOTE — Telephone Encounter (Signed)
He is scheduled for an OV today at 12pm.

## 2016-12-20 NOTE — Addendum Note (Signed)
Addended by: Pilar Grammes on: 12/20/2016 04:12 PM   Modules accepted: Orders

## 2016-12-20 NOTE — Telephone Encounter (Signed)
I don't know who Tye Maryland is---but please touch base with the [facility] independent living nurse. This sounds like a marked recent decline--more than the slow steady one he has had. Will need them to evaluate him

## 2016-12-20 NOTE — Assessment & Plan Note (Signed)
No Rx now so shouldn't get hypoglycemia

## 2016-12-22 DIAGNOSIS — N400 Enlarged prostate without lower urinary tract symptoms: Secondary | ICD-10-CM

## 2016-12-22 DIAGNOSIS — I482 Chronic atrial fibrillation: Secondary | ICD-10-CM

## 2016-12-22 DIAGNOSIS — F0150 Vascular dementia without behavioral disturbance: Secondary | ICD-10-CM

## 2016-12-22 DIAGNOSIS — I10 Essential (primary) hypertension: Secondary | ICD-10-CM

## 2016-12-22 DIAGNOSIS — E119 Type 2 diabetes mellitus without complications: Secondary | ICD-10-CM

## 2017-01-25 DIAGNOSIS — F0150 Vascular dementia without behavioral disturbance: Secondary | ICD-10-CM | POA: Diagnosis not present

## 2017-01-25 DIAGNOSIS — N401 Enlarged prostate with lower urinary tract symptoms: Secondary | ICD-10-CM | POA: Diagnosis not present

## 2017-01-25 DIAGNOSIS — E119 Type 2 diabetes mellitus without complications: Secondary | ICD-10-CM

## 2017-01-25 DIAGNOSIS — E785 Hyperlipidemia, unspecified: Secondary | ICD-10-CM | POA: Diagnosis not present

## 2017-01-25 DIAGNOSIS — I4891 Unspecified atrial fibrillation: Secondary | ICD-10-CM | POA: Diagnosis not present

## 2017-01-25 DIAGNOSIS — I10 Essential (primary) hypertension: Secondary | ICD-10-CM | POA: Diagnosis not present

## 2017-01-30 DIAGNOSIS — E119 Type 2 diabetes mellitus without complications: Secondary | ICD-10-CM | POA: Diagnosis not present

## 2017-02-06 DIAGNOSIS — E871 Hypo-osmolality and hyponatremia: Secondary | ICD-10-CM | POA: Diagnosis not present

## 2017-02-06 DIAGNOSIS — E119 Type 2 diabetes mellitus without complications: Secondary | ICD-10-CM | POA: Diagnosis not present

## 2017-02-07 ENCOUNTER — Inpatient Hospital Stay
Admission: EM | Admit: 2017-02-07 | Discharge: 2017-02-09 | DRG: 689 | Disposition: A | Discharge status: Skilled Nursing Facility | Payer: Medicare Other | Attending: Internal Medicine | Admitting: Internal Medicine

## 2017-02-07 ENCOUNTER — Other Ambulatory Visit: Payer: Self-pay

## 2017-02-07 ENCOUNTER — Telehealth: Payer: Self-pay | Admitting: *Deleted

## 2017-02-07 ENCOUNTER — Telehealth: Payer: Self-pay | Admitting: Internal Medicine

## 2017-02-07 ENCOUNTER — Encounter: Payer: Self-pay | Admitting: Emergency Medicine

## 2017-02-07 DIAGNOSIS — Z66 Do not resuscitate: Secondary | ICD-10-CM | POA: Diagnosis present

## 2017-02-07 DIAGNOSIS — N179 Acute kidney failure, unspecified: Secondary | ICD-10-CM

## 2017-02-07 DIAGNOSIS — I482 Chronic atrial fibrillation: Secondary | ICD-10-CM | POA: Diagnosis present

## 2017-02-07 DIAGNOSIS — E785 Hyperlipidemia, unspecified: Secondary | ICD-10-CM | POA: Diagnosis present

## 2017-02-07 DIAGNOSIS — E871 Hypo-osmolality and hyponatremia: Secondary | ICD-10-CM | POA: Diagnosis not present

## 2017-02-07 DIAGNOSIS — B952 Enterococcus as the cause of diseases classified elsewhere: Secondary | ICD-10-CM | POA: Diagnosis present

## 2017-02-07 DIAGNOSIS — Z833 Family history of diabetes mellitus: Secondary | ICD-10-CM | POA: Diagnosis not present

## 2017-02-07 DIAGNOSIS — Z7401 Bed confinement status: Secondary | ICD-10-CM | POA: Diagnosis not present

## 2017-02-07 DIAGNOSIS — R339 Retention of urine, unspecified: Secondary | ICD-10-CM | POA: Diagnosis not present

## 2017-02-07 DIAGNOSIS — R310 Gross hematuria: Secondary | ICD-10-CM | POA: Diagnosis present

## 2017-02-07 DIAGNOSIS — E119 Type 2 diabetes mellitus without complications: Secondary | ICD-10-CM | POA: Diagnosis present

## 2017-02-07 DIAGNOSIS — I2510 Atherosclerotic heart disease of native coronary artery without angina pectoris: Secondary | ICD-10-CM | POA: Diagnosis present

## 2017-02-07 DIAGNOSIS — R05 Cough: Secondary | ICD-10-CM

## 2017-02-07 DIAGNOSIS — Z87891 Personal history of nicotine dependence: Secondary | ICD-10-CM

## 2017-02-07 DIAGNOSIS — N400 Enlarged prostate without lower urinary tract symptoms: Secondary | ICD-10-CM | POA: Diagnosis present

## 2017-02-07 DIAGNOSIS — R319 Hematuria, unspecified: Secondary | ICD-10-CM | POA: Diagnosis present

## 2017-02-07 DIAGNOSIS — R338 Other retention of urine: Secondary | ICD-10-CM | POA: Diagnosis not present

## 2017-02-07 DIAGNOSIS — G9341 Metabolic encephalopathy: Secondary | ICD-10-CM | POA: Diagnosis present

## 2017-02-07 DIAGNOSIS — N390 Urinary tract infection, site not specified: Principal | ICD-10-CM | POA: Diagnosis present

## 2017-02-07 DIAGNOSIS — Z7901 Long term (current) use of anticoagulants: Secondary | ICD-10-CM

## 2017-02-07 DIAGNOSIS — Z8673 Personal history of transient ischemic attack (TIA), and cerebral infarction without residual deficits: Secondary | ICD-10-CM | POA: Diagnosis not present

## 2017-02-07 DIAGNOSIS — Z8249 Family history of ischemic heart disease and other diseases of the circulatory system: Secondary | ICD-10-CM

## 2017-02-07 DIAGNOSIS — R4182 Altered mental status, unspecified: Secondary | ICD-10-CM | POA: Diagnosis not present

## 2017-02-07 DIAGNOSIS — E860 Dehydration: Secondary | ICD-10-CM | POA: Diagnosis present

## 2017-02-07 DIAGNOSIS — F0390 Unspecified dementia without behavioral disturbance: Secondary | ICD-10-CM | POA: Diagnosis present

## 2017-02-07 DIAGNOSIS — Z8572 Personal history of non-Hodgkin lymphomas: Secondary | ICD-10-CM | POA: Diagnosis not present

## 2017-02-07 DIAGNOSIS — L8990 Pressure ulcer of unspecified site, unspecified stage: Secondary | ICD-10-CM

## 2017-02-07 DIAGNOSIS — R059 Cough, unspecified: Secondary | ICD-10-CM

## 2017-02-07 DIAGNOSIS — I10 Essential (primary) hypertension: Secondary | ICD-10-CM | POA: Diagnosis present

## 2017-02-07 LAB — CBC
HCT: 32.5 % — ABNORMAL LOW (ref 40.0–52.0)
Hemoglobin: 11 g/dL — ABNORMAL LOW (ref 13.0–18.0)
MCH: 31.5 pg (ref 26.0–34.0)
MCHC: 34 g/dL (ref 32.0–36.0)
MCV: 92.6 fL (ref 80.0–100.0)
PLATELETS: 160 10*3/uL (ref 150–440)
RBC: 3.51 MIL/uL — ABNORMAL LOW (ref 4.40–5.90)
RDW: 14.6 % — AB (ref 11.5–14.5)
WBC: 11.8 10*3/uL — ABNORMAL HIGH (ref 3.8–10.6)

## 2017-02-07 LAB — COMPREHENSIVE METABOLIC PANEL
ALBUMIN: 4.5 g/dL (ref 3.5–5.0)
ALK PHOS: 69 U/L (ref 38–126)
ALT: 25 U/L (ref 17–63)
ANION GAP: 17 — AB (ref 5–15)
AST: 54 U/L — ABNORMAL HIGH (ref 15–41)
BILIRUBIN TOTAL: 1.2 mg/dL (ref 0.3–1.2)
BUN: 27 mg/dL — AB (ref 6–20)
CALCIUM: 9.2 mg/dL (ref 8.9–10.3)
CO2: 19 mmol/L — AB (ref 22–32)
CREATININE: 1.51 mg/dL — AB (ref 0.61–1.24)
Chloride: 87 mmol/L — ABNORMAL LOW (ref 101–111)
GFR calc Af Amer: 45 mL/min — ABNORMAL LOW (ref >60)
GFR calc non Af Amer: 39 mL/min — ABNORMAL LOW (ref >60)
GLUCOSE: 169 mg/dL — AB (ref 65–99)
Potassium: 4.2 mmol/L (ref 3.5–5.1)
SODIUM: 123 mmol/L — AB (ref 135–145)
TOTAL PROTEIN: 6.9 g/dL (ref 6.5–8.1)

## 2017-02-07 LAB — URINALYSIS, COMPLETE (UACMP) WITH MICROSCOPIC
BACTERIA UA: NONE SEEN
SQUAMOUS EPITHELIAL / LPF: NONE SEEN
Specific Gravity, Urine: 1.025 (ref 1.005–1.030)

## 2017-02-07 LAB — PROTIME-INR
INR: 2.73
PROTHROMBIN TIME: 28.7 s — AB (ref 11.4–15.2)

## 2017-02-07 LAB — TYPE AND SCREEN
ABO/RH(D): A POS
ANTIBODY SCREEN: NEGATIVE

## 2017-02-07 LAB — MRSA PCR SCREENING: MRSA by PCR: NEGATIVE

## 2017-02-07 MED ORDER — CEFTRIAXONE SODIUM IN DEXTROSE 20 MG/ML IV SOLN
1.0000 g | Freq: Once | INTRAVENOUS | Status: AC
  Administered 2017-02-07: 1 g via INTRAVENOUS
  Filled 2017-02-07: qty 50

## 2017-02-07 MED ORDER — DILTIAZEM HCL ER COATED BEADS 120 MG PO CP24
120.0000 mg | ORAL_CAPSULE | Freq: Every day | ORAL | Status: DC
  Administered 2017-02-07 – 2017-02-09 (×2): 120 mg via ORAL
  Filled 2017-02-07 (×3): qty 1

## 2017-02-07 MED ORDER — SODIUM CHLORIDE 0.9 % IV SOLN
INTRAVENOUS | Status: DC
  Administered 2017-02-07 – 2017-02-08 (×2): via INTRAVENOUS

## 2017-02-07 MED ORDER — ACETAMINOPHEN 650 MG RE SUPP: 650.0000 mg | Freq: Four times a day (QID) | RECTAL | Status: DC | PRN

## 2017-02-07 MED ORDER — SODIUM CHLORIDE 0.9 % IV BOLUS (SEPSIS)
1000.0000 mL | Freq: Once | INTRAVENOUS | Status: AC
  Administered 2017-02-07: 1000 mL via INTRAVENOUS

## 2017-02-07 MED ORDER — ACETAMINOPHEN 325 MG PO TABS
650.0000 mg | ORAL_TABLET | Freq: Four times a day (QID) | ORAL | Status: DC | PRN
  Administered 2017-02-09: 650 mg via ORAL
  Filled 2017-02-07: qty 2

## 2017-02-07 MED ORDER — SODIUM CHLORIDE 0.9 % IV BOLUS (SEPSIS)
500.0000 mL | Freq: Once | INTRAVENOUS | Status: AC
  Administered 2017-02-07: 500 mL via INTRAVENOUS

## 2017-02-07 MED ORDER — DEXTROSE 5 % IV SOLN
1.0000 g | INTRAVENOUS | Status: DC
  Administered 2017-02-08: 1 g via INTRAVENOUS
  Filled 2017-02-07: qty 10

## 2017-02-07 MED ORDER — ONDANSETRON HCL 4 MG/2ML IJ SOLN
INTRAMUSCULAR | Status: AC
  Administered 2017-02-07: 4 mg via INTRAVENOUS
  Filled 2017-02-07: qty 2

## 2017-02-07 MED ORDER — ROSUVASTATIN CALCIUM 10 MG PO TABS: 5.0000 mg | ORAL_TABLET | ORAL | Status: DC

## 2017-02-07 MED ORDER — ONDANSETRON HCL 4 MG/2ML IJ SOLN
4.0000 mg | Freq: Once | INTRAMUSCULAR | Status: AC
  Administered 2017-02-07: 4 mg via INTRAVENOUS

## 2017-02-07 MED ORDER — TRIAMCINOLONE ACETONIDE 0.1 % EX CREA
TOPICAL_CREAM | Freq: Two times a day (BID) | CUTANEOUS | Status: DC | PRN
  Filled 2017-02-07: qty 15

## 2017-02-07 MED ORDER — ONDANSETRON HCL 4 MG PO TABS: 4.0000 mg | ORAL_TABLET | Freq: Four times a day (QID) | ORAL | Status: DC | PRN

## 2017-02-07 MED ORDER — ONDANSETRON HCL 4 MG/2ML IJ SOLN: 4.0000 mg | Freq: Four times a day (QID) | INTRAMUSCULAR | Status: DC | PRN

## 2017-02-07 MED ORDER — ADULT MULTIVITAMIN W/MINERALS CH
1.0000 | ORAL_TABLET | Freq: Every day | ORAL | Status: DC
  Administered 2017-02-07 – 2017-02-09 (×3): 1 via ORAL
  Filled 2017-02-07 (×3): qty 1

## 2017-02-07 NOTE — ED Notes (Signed)
Admitting provider at bedside.

## 2017-02-07 NOTE — Telephone Encounter (Signed)
See other note

## 2017-02-07 NOTE — Telephone Encounter (Signed)
Copied from [place]. Topic: Referral - Request >> Feb 07, 2017  8:29 AM Lennox Solders wrote: Reason for CRM: pt son Eddie Dibbles is requesting a referral to [facility] urologist phone [phone]. Pt is passing blood in urine

## 2017-02-07 NOTE — H&P (Signed)
[facility] [place] NAME: Jimmy Riggs    MR#:  [id number]  DATE OF BIRTH:  [birth year]  DATE OF ADMISSION:  02/07/2017  PRIMARY CARE PHYSICIAN: Venia Carbon, MD   REQUESTING/REFERRING PHYSICIAN: Dr. Charlotte Crumb  CHIEF COMPLAINT:   Chief Complaint  Patient presents with  . Hematuria    HISTORY OF PRESENT ILLNESS:  Jimmy Riggs  is a 83 y.o. male with a known history of chronic atrial fibrillation, non-insulin-dependent diabetes, osteoarthritis, hypertension, hyperlipidemia, BPH, history of coronary artery disease, previous history of a traumatic intracranial hemorrhage who presents to the hospital due to hematuria. Patient has underlying mild cognitive decline and resides at memory care at [facility] and therefore most of the history obtained from the son at bedside. As per the son patient developed some mild hematuria last night which has progressively gotten worse is morning and therefore he was sent to the ER for further evaluation. In the ER patient had a bladder scan which showed urinary retention and he continues to have hematuria. A Foley catheter was placed and hospitalist services were called for further treatment and evaluation.  PAST MEDICAL HISTORY:   Past Medical History:  Diagnosis Date  . Acute intra-cranial hemorrhage (HCC)    w/ coumadin  . Atrial fibrillation ([place])   . Benign prostatic hypertrophy   . Coronary artery disease   . Hyperlipidemia   . Hypertension   . Inguinal hernia   . Lymphoma ([place])   . Melanoma in situ of shoulder ([place])   . NIDDM (non-insulin dependent diabetes mellitus)   . Osteoarthritis   . S/P tonsillectomy and adenoidectomy 1931  . Thrombocytopenia ([place])     PAST SURGICAL HISTORY:   Past Surgical History:  Procedure Laterality Date  . CATARACT EXTRACTION    . CHOLECYSTECTOMY    . TONSILLECTOMY AND ADENOIDECTOMY      SOCIAL HISTORY:   Social History   Tobacco Use  . Smoking  status: Former Smoker    Packs/day: 0.00    Years: 20.00    Pack years: 0.00    Last attempt to quit: 03/14/1980    Years since quitting: 36.9  . Smokeless tobacco: Never Used  Substance Use Topics  . Alcohol use: Yes    Comment: socially    FAMILY HISTORY:   Family History  Problem Relation Age of Onset  . Heart failure Mother   . Diabetes Mother   . Hypertension Mother   . Prostate cancer Father   . Arthritis Sister     DRUG ALLERGIES:  No Known Allergies  REVIEW OF SYSTEMS:   Review of Systems  Constitutional: Negative for fever and weight loss.  HENT: Negative for congestion, nosebleeds and tinnitus.   Eyes: Negative for blurred vision, double vision and redness.  Respiratory: Negative for cough, hemoptysis and shortness of breath.   Cardiovascular: Negative for chest pain, orthopnea, leg swelling and PND.  Gastrointestinal: Negative for abdominal pain, diarrhea, melena, nausea and vomiting.  Genitourinary: Positive for hematuria. Negative for dysuria and urgency.  Musculoskeletal: Negative for falls and joint pain.  Neurological: Negative for dizziness, tingling, sensory change, focal weakness, seizures, weakness and headaches.  Endo/Heme/Allergies: Negative for polydipsia. Does not bruise/bleed easily.  Psychiatric/Behavioral: Negative for depression and memory loss. The patient is not nervous/anxious.     MEDICATIONS AT HOME:   Prior to Admission medications   Medication Sig Start Date End Date Taking? Authorizing Provider  diltiazem (CARDIZEM CD) 120 MG 24  hr capsule TAKE 1 CAPSULE BY MOUTH  DAILY 12/12/16  Yes Venia Carbon, MD  hydrochlorothiazide (HYDRODIURIL) 25 MG tablet Take 1 tablet (25 mg total) by mouth daily. 05/12/16  Yes Venia Carbon, MD  lisinopril (PRINIVIL,ZESTRIL) 5 MG tablet Take by mouth daily.   Yes [provider]  Multiple Vitamin (MULTIVITAMIN) tablet Take 1 tablet by mouth daily.     Yes [provider]   rosuvastatin (CRESTOR) 5 MG tablet TAKE 1 TABLET BY MOUTH  EVERY OTHER DAY 12/19/16  Yes Venia Carbon, MD  XARELTO 20 MG TABS tablet TAKE 1 TABLET BY MOUTH ONCE DAILY WITH SUPPER 02/02/16  Yes Viviana Simpler I, MD  glucose blood (ONE TOUCH ULTRA TEST) test strip Use as instructed to test blood sugar once daily dx: E11.40 10/14/14   Venia Carbon, MD  Lancets [facility] ULTRASOFT) lancets Use as instructed to test blood sugar once daily dx: E11.49 03/20/14   Venia Carbon, MD  lisinopril (PRINIVIL,ZESTRIL) 20 MG tablet TAKE 1 TABLET BY MOUTH  DAILY Patient not taking: Reported on 02/07/2017 02/01/16   Viviana Simpler I, MD  triamcinolone cream (KENALOG) 0.1 % apply to affected area twice a day if needed 02/18/15   Venia Carbon, MD      VITAL SIGNS:  Blood pressure 109/69, pulse (!) 128, temperature 97.6 F (36.4 C), temperature source Axillary, resp. rate (!) 24, height 5\' 7"  (1.702 m), weight 59 kg (130 lb), SpO2 90 %.  PHYSICAL EXAMINATION:  Physical Exam  GENERAL:  83 y.o.-year-old patient lying in the bed in no acute distress.  EYES: Pupils equal, round, reactive to light and accommodation. No scleral icterus. Extraocular muscles intact.  HEENT: Head atraumatic, normocephalic. Oropharynx and nasopharynx clear. No oropharyngeal erythema, moist oral mucosa  NECK:  Supple, no jugular venous distention. No thyroid enlargement, no tenderness.  LUNGS: Normal breath sounds bilaterally, no wheezing, rales, rhonchi. No use of accessory muscles of respiration.  CARDIOVASCULAR: S1, S2 Irregular. No murmurs, rubs, gallops, clicks.  ABDOMEN: Soft, nontender, nondistended. Bowel sounds present. No organomegaly or mass.  EXTREMITIES: No pedal edema, cyanosis, or clubbing. + 2 pedal & radial pulses b/l.   NEUROLOGIC: Cranial nerves II through XII are intact. No focal Motor or sensory deficits appreciated b/l. Globally weak.  PSYCHIATRIC: The patient is alert and oriented x 1.  SKIN: No  obvious rash, lesion, or ulcer.   LABORATORY PANEL:   CBC Recent Labs  Lab 02/07/17 1019  WBC 11.8*  HGB 11.0*  HCT 32.5*  PLT 160   ------------------------------------------------------------------------------------------------------------------  Chemistries  Recent Labs  Lab 02/07/17 1019  NA 123*  K 4.2  CL 87*  CO2 19*  GLUCOSE 169*  BUN 27*  CREATININE 1.51*  CALCIUM 9.2  AST 54*  ALT 25  ALKPHOS 69  BILITOT 1.2   ------------------------------------------------------------------------------------------------------------------  Cardiac Enzymes No results for input(s): TROPONINI in the last 168 hours. ------------------------------------------------------------------------------------------------------------------  RADIOLOGY:  No results found.   IMPRESSION AND PLAN:   83 year old male with past medical history of chronic atrial fibrillation, BPH, hypertension, mild cognitive decline, history of internal hemorrhage, thrombocytopenia, osteoarthritis, non-insulin-dependent diabetes who presents to the hospital due to hematuria.  1. Acute hematuria with urinary retention-etiology unclear presently. Patient does have a history of BPH. Patient is status post Foley catheter placement. We'll get urology consult, empirically treat the patient for UTI based on urinalysis. -Continue supportive care with IV fluids.  2. Hyponatremia-this is hypovolemic hyponatremia. I will hold the patient's HCTZ.  Gently hydrate the patient with IV fluids. Follow sodium.  3. Acute kidney injury-secondary to urinary retention. Patient is status post Foley catheter placement. Will follow BUN/creatinine, renal dose meds, avoid nephrotoxins.  4. History of chronic atrial fibrillation-rate still somewhat uncontrolled presently. I will continue his Cardizem. If needed will give IV Cardizem. Hold Xarelto given the hematuria.  5. Hyperlipidemia-continue Crestor.  6. Essential  hypertension-continue Cardizem. Hold HCTZ and lisinopril given the hyponatremia and acute kidney injury.  All the records are reviewed and case discussed with ED provider. Management plans discussed with the patient, family and they are in agreement.  CODE STATUS: DO NOT RESUSCITATE  TOTAL TIME TAKING CARE OF THIS PATIENT: 45 minutes.    Henreitta Leber M.D on 02/07/2017 at 1:23 PM  Between 7am to 6pm - Pager - [phone]  After 6pm go to [email or web address] - password [email or web address] [facility]  Office  [phone]  CC: Primary care physician; Venia Carbon, MD

## 2017-02-07 NOTE — ED Provider Notes (Addendum)
[facility] Emergency Department Provider Note  ____________________________________________   I have reviewed the triage vital signs and the nursing notes.   HISTORY  Chief Complaint Hematuria    HPI Jimmy Wey. is a 83 y.o. male who is on Xarelto, with a history of intracranial hemorrhage on Coumadin in the past, history of dementia, and a memory facility, presents today at his neurologic baseline however he has been having hematuria since yesterday according the notes.  Patient had pink urine yesterday now with significant bleeding.  He did have "surgery to open things up in there" 12 years ago in [place].  No history of prostate or bladder cancer.  The patient himself cannot give a history, no fevers document by nursing home.  Level 5 chart caveat; no further history available due to patient status.  History is also per his son who is also in the room.  Past Medical History:  Diagnosis Date  . Acute intra-cranial hemorrhage (HCC)    w/ coumadin  . Atrial fibrillation ([place])   . Benign prostatic hypertrophy   . Coronary artery disease   . Hyperlipidemia   . Hypertension   . Inguinal hernia   . Lymphoma ([place])   . Melanoma in situ of shoulder ([place])   . NIDDM (non-insulin dependent diabetes mellitus)   . Osteoarthritis   . S/P tonsillectomy and adenoidectomy 1931  . Thrombocytopenia [facility])     Patient Active Problem List   Diagnosis Date Noted  . Malnutrition of mild degree ([place]) 11/17/2016  . Vascular dementia without behavioral disturbance 11/10/2015  . Thrombocytopenia ([place])   . Preventative health care 09/29/2014  . Advance directive discussed with patient 09/29/2014  . Diabetes, polyneuropathy ([place]) 04/08/2013  . Osteoarthritis of left knee 06/09/2011  . Type 2 diabetes, controlled, with peripheral neuropathy ([place]) 07/28/2006  . Hyperlipemia 07/28/2006  . Essential hypertension, benign 07/28/2006  . Coronary atherosclerosis of native  coronary artery 07/28/2006  . Atrial fibrillation ([place]) 07/28/2006  . BPH with obstruction/lower urinary tract symptoms 07/28/2006    Past Surgical History:  Procedure Laterality Date  . CATARACT EXTRACTION    . CHOLECYSTECTOMY    . TONSILLECTOMY AND ADENOIDECTOMY      Prior to Admission medications   Medication Sig Start Date End Date Taking? Authorizing Provider  diltiazem (CARDIZEM CD) 120 MG 24 hr capsule TAKE 1 CAPSULE BY MOUTH  DAILY 12/12/16  Yes Viviana Simpler I, MD  hydrochlorothiazide (HYDRODIURIL) 25 MG tablet Take 1 tablet (25 mg total) by mouth daily. 05/12/16  Yes Venia Carbon, MD  lisinopril (PRINIVIL,ZESTRIL) 5 MG tablet Take by mouth daily.   Yes [provider]  Multiple Vitamin (MULTIVITAMIN) tablet Take 1 tablet by mouth daily.     Yes [provider]  rosuvastatin (CRESTOR) 5 MG tablet TAKE 1 TABLET BY MOUTH  EVERY OTHER DAY 12/19/16  Yes Venia Carbon, MD  XARELTO 20 MG TABS tablet TAKE 1 TABLET BY MOUTH ONCE DAILY WITH SUPPER 02/02/16  Yes Viviana Simpler I, MD  glucose blood (ONE TOUCH ULTRA TEST) test strip Use as instructed to test blood sugar once daily dx: E11.40 10/14/14   Venia Carbon, MD  Lancets [facility] ULTRASOFT) lancets Use as instructed to test blood sugar once daily dx: E11.49 03/20/14   Venia Carbon, MD  lisinopril (PRINIVIL,ZESTRIL) 20 MG tablet TAKE 1 TABLET BY MOUTH  DAILY Patient not taking: Reported on 02/07/2017 02/01/16   Viviana Simpler I, MD  triamcinolone cream (KENALOG) 0.1 %  apply to affected area twice a day if needed 02/18/15   Venia Carbon, MD    Allergies Patient has no known allergies.  Family History  Problem Relation Age of Onset  . Heart failure Mother   . Diabetes Mother   . Hypertension Mother   . Prostate cancer Father   . Arthritis Sister     Social History Social History   Tobacco Use  . Smoking status: Former Smoker    Packs/day: 0.00    Years: 20.00    Pack years: 0.00     Last attempt to quit: 03/14/1980    Years since quitting: 36.9  . Smokeless tobacco: Never Used  Substance Use Topics  . Alcohol use: Yes  . Drug use: No    Review of Systems Level 5 chart caveat; no further history available due to patient status.   ____________________________________________   PHYSICAL EXAM:  VITAL SIGNS: ED Triage Vitals  Enc Vitals Group     BP 02/07/17 1017 120/70     Pulse Rate 02/07/17 1017 (!) 103     Resp 02/07/17 1017 14     Temp 02/07/17 1017 97.6 F (36.4 C)     Temp Source 02/07/17 1017 Axillary     SpO2 --      Weight 02/07/17 1010 130 lb (59 kg)     Height 02/07/17 1016 5\' 7"  (1.702 m)     Head Circumference --      Peak Flow --      Pain Score --      Pain Loc --      Pain Edu? --      Excl. in [place]? --     Constitutional: Alert and pleasantly demented, pale but no acute distress, neurologically at his baseline per family Eyes: Conjunctivae are normal Head: Atraumatic HEENT: No congestion/rhinnorhea. Mucous membranes are moist.  Oropharynx non-erythematous Neck:   Nontender with no meningismus, no masses, no stridor Cardiovascular: Normal rate, regular rhythm. Grossly normal heart sounds.  Good peripheral circulation. Respiratory: Normal respiratory effort.  No retractions. Lungs CTAB. Abdominal: Soft and nontender. No distention. No guarding no rebound Back:  There is no focal tenderness or step off.  there is no midline tenderness there are no lesions noted. there is no CVA tenderness Musculoskeletal: No lower extremity tenderness, no upper extremity tenderness. No joint effusions, no DVT signs strong distal pulses no edema Neurologic:  Normal speech and language. No gross focal neurologic deficits are appreciated.  Skin:  Skin is warm, dry and intact. No rash noted. Psychiatric: Mood and affect are normal. Speech and behavior are normal.  ____________________________________________   LABS (all labs ordered are listed, but only  abnormal results are displayed)  Labs Reviewed  CBC - Abnormal; Notable for the following components:      Result Value   WBC 11.8 (*)    RBC 3.51 (*)    Hemoglobin 11.0 (*)    HCT 32.5 (*)    RDW 14.6 (*)    All other components within normal limits  URINE CULTURE  COMPREHENSIVE METABOLIC PANEL  PROTIME-INR  URINALYSIS, COMPLETE (UACMP) WITH MICROSCOPIC  POC OCCULT BLOOD, ED  TYPE AND SCREEN    Pertinent labs  results that were available during my care of the patient were reviewed by me and considered in my medical decision making (see chart for details). ____________________________________________  EKG  I personally interpreted any EKGs ordered by me or triage Atrial fibrillation rate 131 rate 131, right bundle  branch block noted no acute ischemia ____________________________________________  RADIOLOGY  Pertinent labs & imaging results that were available during my care of the patient were reviewed by me and considered in my medical decision making (see chart for details). If possible, patient and/or family made aware of any abnormal findings.  No results found. ____________________________________________    PROCEDURES  Procedure(s) performed: None  Procedures  Critical Care performed: CRITICAL CARE Performed by: Schuyler Amor   Total critical care time: 35 minutes  Critical care time was exclusive of separately billable procedures and treating other patients.  Critical care was necessary to treat or prevent imminent or life-threatening deterioration.  Critical care was time spent personally by me on the following activities: development of treatment plan with patient and/or surrogate as well as nursing, discussions with consultants, evaluation of patient's response to treatment, examination of patient, obtaining history from patient or surrogate, ordering and performing treatments and interventions, ordering and review of laboratory studies, ordering and  review of radiographic studies, pulse oximetry and re-evaluation of patient's condition.   ____________________________________________   INITIAL IMPRESSION / ASSESSMENT AND PLAN / ED COURSE  Pertinent labs & imaging results that were available during my care of the patient were reviewed by me and considered in my medical decision making (see chart for details).  Patient here with hematuria, he himself cannot give a history.  Blood is significantly present in his urine.  On known etiology.  Differential includes bladder cancer, urinary tract infection, trauma, we will obtain CBC urinalysis, coags, and we will continue to assess the patient.Marland Kitchen  ----------------------------------------- 12:35 PM on 02/07/2017 -----------------------------------------  Gust with on-call Dr. Junious Silk for urology who agrees with bladder scan and if there is evidence of urinary retention agrees with Foley, I would give patient Rocephin for possible UTI although I suspect this is more likely secondary to his anticoagulation.  Patient INR is 2.73 which is somewhat elevated for Xarelto.  Dr. Junious Silk does not wish further evaluation at this time in the emergency department.  Urine culture is pending.  We will admit the patient to the hospital for multiple different reasons including acute renal injury, hyponatremia    ____________________________________________   FINAL CLINICAL IMPRESSION(S) / ED DIAGNOSES  Final diagnoses:  None      This chart was dictated using voice recognition software.  Despite best efforts to proofread,  errors can occur which can change meaning.      Schuyler Amor, MD 02/07/17 1124    Schuyler Amor, MD 02/07/17 1236    Schuyler Amor, MD 02/07/17 [phone]

## 2017-02-07 NOTE — Telephone Encounter (Signed)
That was my suggestion I have spoken to Manuela Schwartz the nurse there and I told her that with the worsened bleeding he should go to [facility] ER

## 2017-02-07 NOTE — Telephone Encounter (Signed)
Spoke to pts daughter in [employer], who states Eddie Dibbles received message and has taken pt to ED

## 2017-02-07 NOTE — Telephone Encounter (Signed)
Please let him know that I spoke to Manuela Schwartz a couple of times this morning and was going to proceed with the referral till the bleeding got worse (I have now recommended ER evaluation)

## 2017-02-07 NOTE — ED Notes (Signed)
First Nurse: pt brought over by son from [facility] with reports of rectal bleeding since this am.

## 2017-02-07 NOTE — Telephone Encounter (Signed)
Copied from [place]. Topic: General - Other >> Feb 07, 2017 10:00 AM Yvette Rack wrote: Reason for CRM: patient daughter is calling to let Dr Silvio Pate know that [facility] called them to let them know that patient can't urinate and that they suggest that they take him to the hospital so they are on there way to [facility]

## 2017-02-07 NOTE — Telephone Encounter (Signed)
Patient's son called to notify office that Manuela Schwartz- nurse at [facility] had notified him that his father had started passing blood in his urine last night. It has continued today. He is aware of the need for the urology referral and is awaiting information on that. Please notify him about the appointment- or the process- he wants to know what he needs to do. Discussed the reason for urine culture and treatment with antibiotic for know. He understands. [phone]

## 2017-02-07 NOTE — Consult Note (Signed)
Consult: Gross hematuria Requested by: Dr. Charlotte Crumb  History of Present Illness: 83 year old admitted with gross hematuria.  Per the notes, his son reports the patient developed hematuria last night and then it looked worse this morning and they brought him to the emergency department.  A bladder scan revealed 451 mL and a Foley catheter was placed.  The urine in the bag this evening is red but in the tubing is not clear, but there is a lot of debris.  The patient has been afebrile.  His white count was 11.8, creatinine is up slightly at 1.51 and UA showed too numerous to count white cells and too numerous to count red cells.  He is typically on Xarelto and his INR was out a bit at 2.7.  No family at the bedside and patient could not contribute to the history.  He was not sure about the duration or his prior exposure risk.  He is not certain if he has had any trouble voiding. It is reported the patient has a history of BPH and elevated PSA, but at a 2014 visit with Dr. Jacqlyn Larsen, the patient was not on any prostate medication, had a normal post void and a PSA of 2.6 (note on CareEverywhere).   Past Medical History:  Diagnosis Date  . Acute intra-cranial hemorrhage (HCC)    w/ coumadin  . Atrial fibrillation ([place])   . Benign prostatic hypertrophy   . Coronary artery disease   . Hyperlipidemia   . Hypertension   . Inguinal hernia   . Lymphoma ([place])   . Melanoma in situ of shoulder ([place])   . NIDDM (non-insulin dependent diabetes mellitus)   . Osteoarthritis   . S/P tonsillectomy and adenoidectomy 1931  . Thrombocytopenia ([place])    Past Surgical History:  Procedure Laterality Date  . CATARACT EXTRACTION    . CHOLECYSTECTOMY    . TONSILLECTOMY AND ADENOIDECTOMY      Home Medications:  Medications Prior to Admission  Medication Sig Dispense Refill Last Dose  . diltiazem (CARDIZEM CD) 120 MG 24 hr capsule TAKE 1 CAPSULE BY MOUTH  DAILY 90 capsule 1 02/06/2017 at 0900  .  hydrochlorothiazide (HYDRODIURIL) 25 MG tablet Take 1 tablet (25 mg total) by mouth daily. 90 tablet 3 02/06/2017 at 0900  . lisinopril (PRINIVIL,ZESTRIL) 5 MG tablet Take by mouth daily.   02/06/2017 at 1700  . Multiple Vitamin (MULTIVITAMIN) tablet Take 1 tablet by mouth daily.     02/06/2017 at 0900  . rosuvastatin (CRESTOR) 5 MG tablet TAKE 1 TABLET BY MOUTH  EVERY OTHER DAY 45 tablet 3 02/06/2017 at 1700  . XARELTO 20 MG TABS tablet TAKE 1 TABLET BY MOUTH ONCE DAILY WITH SUPPER 90 tablet 3 02/06/2017 at 1700  . glucose blood (ONE TOUCH ULTRA TEST) test strip Use as instructed to test blood sugar once daily dx: E11.40 100 each 3 Taking  . Lancets (ONETOUCH ULTRASOFT) lancets Use as instructed to test blood sugar once daily dx: E11.49 100 each 3 Taking  . lisinopril (PRINIVIL,ZESTRIL) 20 MG tablet TAKE 1 TABLET BY MOUTH  DAILY (Patient not taking: Reported on 02/07/2017) 90 tablet 3 Not Taking at Unknown time  . triamcinolone cream (KENALOG) 0.1 % apply to affected area twice a day if needed 45 g 2 prn at prn   Allergies: No Known Allergies  Family History  Problem Relation Age of Onset  . Heart failure Mother   . Diabetes Mother   . Hypertension Mother   . Prostate  cancer Father   . Arthritis Sister    Social History:  reports that he quit smoking about 36 years ago. He smoked 0.00 packs per day for 20.00 years. he has never used smokeless tobacco. He reports that he drinks alcohol. He reports that he does not use drugs.  ROS: A complete review of systems was performed.  All systems are negative except for pertinent findings as noted. ROS   Physical Exam:  Vital signs in last 24 hours: Temp:  [97.6 F (36.4 C)-99.1 F (37.3 C)] 99.1 F (37.3 C) (11/27 2015) Pulse Rate:  [92-138] 92 (11/27 2015) Resp:  [14-30] 22 (11/27 2015) BP: (84-125)/(51-94) 84/51 (11/27 2015) SpO2:  [90 %-100 %] 94 % (11/27 2015) Weight:  [59 kg (130 lb)] 59 kg (130 lb) (11/27 1016) General:  Alert and  not oriented, No acute distress, pleasant and smiling HEENT: Normocephalic, atraumatic Cardiovascular: Regular rate and rhythm Lungs: Regular rate and effort Abdomen: Soft, nontender, nondistended, no abdominal masses Back: No CVA tenderness Extremities: No edema Neurologic: Grossly intact GU: The penis is circumcised without mass or lesion, no suprapubic distention or tenderness, testicles descended bilaterally and palpably normal, scrotum normal.  16 French Foley catheter in place.  Urine clear in tubing with some thick gray dishwater debris.  Catheter irrigation: I irrigated the catheter with about 500 mL of water and the bladder irrigated clear.  No other debris noted.  No hematuria.  Laboratory Data:  Results for orders placed or performed during the hospital encounter of 02/07/17 (from the past 24 hour(s))  Comprehensive metabolic panel     Status: Abnormal   Collection Time: 02/07/17 10:19 AM  Result Value Ref Range   Sodium 123 (L) 135 - 145 mmol/L   Potassium 4.2 3.5 - 5.1 mmol/L   Chloride 87 (L) 101 - 111 mmol/L   CO2 19 (L) 22 - 32 mmol/L   Glucose, Bld 169 (H) 65 - 99 mg/dL   BUN 27 (H) 6 - 20 mg/dL   Creatinine, Ser 1.51 (H) 0.61 - 1.24 mg/dL   Calcium 9.2 8.9 - 10.3 mg/dL   Total Protein 6.9 6.5 - 8.1 g/dL   Albumin 4.5 3.5 - 5.0 g/dL   AST 54 (H) 15 - 41 U/L   ALT 25 17 - 63 U/L   Alkaline Phosphatase 69 38 - 126 U/L   Total Bilirubin 1.2 0.3 - 1.2 mg/dL   GFR calc non Af Amer 39 (L) >60 mL/min   GFR calc Af Amer 45 (L) >60 mL/min   Anion gap 17 (H) 5 - 15  CBC     Status: Abnormal   Collection Time: 02/07/17 10:19 AM  Result Value Ref Range   WBC 11.8 (H) 3.8 - 10.6 K/uL   RBC 3.51 (L) 4.40 - 5.90 MIL/uL   Hemoglobin 11.0 (L) 13.0 - 18.0 g/dL   HCT 32.5 (L) 40.0 - 52.0 %   MCV 92.6 80.0 - 100.0 fL   MCH 31.5 26.0 - 34.0 pg   MCHC 34.0 32.0 - 36.0 g/dL   RDW 14.6 (H) 11.5 - 14.5 %   Platelets 160 150 - 440 K/uL  Urinalysis, Complete w Microscopic      Status: Abnormal   Collection Time: 02/07/17 10:19 AM  Result Value Ref Range   Color, Urine RED (A) YELLOW   APPearance BLOODY (A) CLEAR   Specific Gravity, Urine 1.025 1.005 - 1.030   pH  5.0 - 8.0    TEST NOT REPORTED DUE  TO COLOR INTERFERENCE OF URINE PIGMENT   Glucose, UA (A) NEGATIVE mg/dL    TEST NOT REPORTED DUE TO COLOR INTERFERENCE OF URINE PIGMENT   Hgb urine dipstick (A) NEGATIVE    TEST NOT REPORTED DUE TO COLOR INTERFERENCE OF URINE PIGMENT   Bilirubin Urine (A) NEGATIVE    TEST NOT REPORTED DUE TO COLOR INTERFERENCE OF URINE PIGMENT   Ketones, ur (A) NEGATIVE mg/dL    TEST NOT REPORTED DUE TO COLOR INTERFERENCE OF URINE PIGMENT   Protein, ur (A) NEGATIVE mg/dL    TEST NOT REPORTED DUE TO COLOR INTERFERENCE OF URINE PIGMENT   Nitrite (A) NEGATIVE    TEST NOT REPORTED DUE TO COLOR INTERFERENCE OF URINE PIGMENT   Leukocytes, UA (A) NEGATIVE    TEST NOT REPORTED DUE TO COLOR INTERFERENCE OF URINE PIGMENT   RBC / HPF TOO NUMEROUS TO COUNT 0 - 5 RBC/hpf   WBC, UA TOO NUMEROUS TO COUNT 0 - 5 WBC/hpf   Bacteria, UA NONE SEEN NONE SEEN   Squamous Epithelial / LPF NONE SEEN NONE SEEN  Protime-INR     Status: Abnormal   Collection Time: 02/07/17 10:41 AM  Result Value Ref Range   Prothrombin Time 28.7 (H) 11.4 - 15.2 seconds   INR 2.73   Type and screen Ordered by PROVIDER DEFAULT     Status: None   Collection Time: 02/07/17 11:34 AM  Result Value Ref Range   ABO/RH(D) A POS    Antibody Screen NEG    Sample Expiration 02/10/2017   MRSA PCR Screening     Status: None   Collection Time: 02/07/17  6:30 PM  Result Value Ref Range   MRSA by PCR NEGATIVE NEGATIVE   Recent Results (from the past 240 hour(s))  MRSA PCR Screening     Status: None   Collection Time: 02/07/17  6:30 PM  Result Value Ref Range Status   MRSA by PCR NEGATIVE NEGATIVE Final    Comment:        The GeneXpert MRSA Assay (FDA approved for NASAL specimens only), is one component of a comprehensive  MRSA colonization surveillance program. It is not intended to diagnose MRSA infection nor to guide or monitor treatment for MRSA infections.    Creatinine: Recent Labs    02/07/17 1019  CREATININE 1.51*    Impression/Assessment/plan: #1 gross hematuria-patient's urine has cleared.  Hematuria may have been from bladder distention and/or cystitis.  Recommend continuation of Rocephin until urine cx is finalized. Also, consider screening urinary tract with renal ultrasound or CT scan of the abdomen and pelvis.  This could be set up outpatient and consideration of cystoscopy should be done at outpatient follow-up.  #2  #2 incomplete bladder emptying-unclear if he was truly in retention.  Recommend starting tamsulosin and a voiding trial no earlier than 5 days - likely sometime next week.  Will sign off. Please page GU with any questions or concerns.    Festus Aloe 02/07/2017, 8:49 PM

## 2017-02-07 NOTE — ED Triage Notes (Addendum)
Sent over from [facility] for evaluation of urinary  bleeding that started this am.  Pt appears with pale color to skin. Pt is on xarelto.

## 2017-02-08 ENCOUNTER — Telehealth: Payer: Self-pay | Admitting: Urology

## 2017-02-08 ENCOUNTER — Inpatient Hospital Stay: Payer: Medicare Other

## 2017-02-08 LAB — BASIC METABOLIC PANEL
ANION GAP: 10 (ref 5–15)
BUN: 33 mg/dL — ABNORMAL HIGH (ref 6–20)
CHLORIDE: 93 mmol/L — AB (ref 101–111)
CO2: 22 mmol/L (ref 22–32)
Calcium: 8.4 mg/dL — ABNORMAL LOW (ref 8.9–10.3)
Creatinine, Ser: 1.28 mg/dL — ABNORMAL HIGH (ref 0.61–1.24)
GFR calc non Af Amer: 48 mL/min — ABNORMAL LOW (ref >60)
GFR, EST AFRICAN AMERICAN: 55 mL/min — AB (ref >60)
GLUCOSE: 133 mg/dL — AB (ref 65–99)
Potassium: 3.6 mmol/L (ref 3.5–5.1)
Sodium: 125 mmol/L — ABNORMAL LOW (ref 135–145)

## 2017-02-08 LAB — CBC
HEMATOCRIT: 31.2 % — AB (ref 40.0–52.0)
HEMOGLOBIN: 10.6 g/dL — AB (ref 13.0–18.0)
MCH: 31.3 pg (ref 26.0–34.0)
MCHC: 34 g/dL (ref 32.0–36.0)
MCV: 91.9 fL (ref 80.0–100.0)
Platelets: 139 10*3/uL — ABNORMAL LOW (ref 150–440)
RBC: 3.39 MIL/uL — AB (ref 4.40–5.90)
RDW: 14.5 % (ref 11.5–14.5)
WBC: 20.1 10*3/uL — ABNORMAL HIGH (ref 3.8–10.6)

## 2017-02-08 LAB — URINALYSIS, COMPLETE (UACMP) WITH MICROSCOPIC
BILIRUBIN URINE: NEGATIVE
Glucose, UA: NEGATIVE mg/dL
KETONES UR: NEGATIVE mg/dL
Nitrite: NEGATIVE
PH: 5 (ref 5.0–8.0)
PROTEIN: 100 mg/dL — AB
Specific Gravity, Urine: 1.015 (ref 1.005–1.030)

## 2017-02-08 LAB — GLUCOSE, CAPILLARY: GLUCOSE-CAPILLARY: 96 mg/dL (ref 65–99)

## 2017-02-08 MED ORDER — BENZONATATE 100 MG PO CAPS
100.0000 mg | ORAL_CAPSULE | Freq: Two times a day (BID) | ORAL | Status: DC | PRN
  Administered 2017-02-08: 100 mg via ORAL
  Filled 2017-02-08: qty 1

## 2017-02-08 MED ORDER — BENZONATATE 100 MG PO CAPS: 100.0000 mg | ORAL_CAPSULE | Freq: Two times a day (BID) | ORAL | Status: DC

## 2017-02-08 MED ORDER — SODIUM CHLORIDE 0.9 % IV BOLUS (SEPSIS)
500.0000 mL | Freq: Once | INTRAVENOUS | Status: AC
  Administered 2017-02-08: 500 mL via INTRAVENOUS

## 2017-02-08 MED ORDER — RIVAROXABAN 20 MG PO TABS
20.0000 mg | ORAL_TABLET | Freq: Every day | ORAL | Status: DC
  Filled 2017-02-08 (×2): qty 1

## 2017-02-08 MED ORDER — AMOXICILLIN 500 MG PO CAPS
500.0000 mg | ORAL_CAPSULE | Freq: Three times a day (TID) | ORAL | Status: DC
  Filled 2017-02-08 (×2): qty 1

## 2017-02-08 MED ORDER — SODIUM CHLORIDE 0.9 % IV SOLN
1.5000 g | Freq: Four times a day (QID) | INTRAVENOUS | Status: DC
  Administered 2017-02-08 – 2017-02-09 (×3): 1.5 g via INTRAVENOUS
  Filled 2017-02-08 (×7): qty 1.5

## 2017-02-08 MED ORDER — GUAIFENESIN-DM 100-10 MG/5ML PO SYRP
5.0000 mL | ORAL_SOLUTION | Freq: Four times a day (QID) | ORAL | Status: DC | PRN
  Administered 2017-02-08 – 2017-02-09 (×3): 5 mL via ORAL
  Filled 2017-02-08 (×3): qty 5

## 2017-02-08 NOTE — Telephone Encounter (Signed)
Per consult note he will possibly need a cysto and void and trial  App scheduled   Sharyn Lull

## 2017-02-08 NOTE — Progress Notes (Signed)
[facility] [place] NAME: Jimmy Riggs    MR#:  [id number]  DATE OF BIRTH:  12-Apr-1924  SUBJECTIVE:   Patient came in from [facility] with hematuria and unable to pee.  His hematuria is cleared.  Patient eating breakfast.  He has some baseline dementia.  Daughter-in-law in the room. No fever. Some dry cough prior to admission. REVIEW OF SYSTEMS:   Review of Systems  Constitutional: Negative for chills, fever and weight loss.  HENT: Negative for ear discharge, ear pain and nosebleeds.   Eyes: Negative for blurred vision, pain and discharge.  Respiratory: Negative for sputum production, shortness of breath, wheezing and stridor.   Cardiovascular: Negative for chest pain, palpitations, orthopnea and PND.  Gastrointestinal: Negative for abdominal pain, diarrhea, nausea and vomiting.  Genitourinary: Negative for frequency and urgency.  Musculoskeletal: Negative for back pain and joint pain.  Neurological: Negative for sensory change, speech change, focal weakness and weakness.  Psychiatric/Behavioral: Negative for depression and hallucinations. The patient is not nervous/anxious.    Tolerating Diet:yes Tolerating PT: pending  DRUG ALLERGIES:  No Known Allergies  VITALS:  Blood pressure (!) 100/56, pulse 92, temperature (!) 97.5 F (36.4 C), temperature source Oral, resp. rate (!) 22, height 5\' 7"  (1.702 m), weight 59 kg (130 lb), SpO2 95 %.  PHYSICAL EXAMINATION:   Physical Exam  GENERAL:  83 y.o.-year-old patient lying in the bed with no acute distress.  EYES: Pupils equal, round, reactive to light and accommodation. No scleral icterus. Extraocular muscles intact.  HEENT: Head atraumatic, normocephalic. Oropharynx and nasopharynx clear.  NECK:  Supple, no jugular venous distention. No thyroid enlargement, no tenderness.  LUNGS: Normal breath sounds bilaterally, no wheezing, rales, rhonchi. No use of accessory muscles of  respiration.  CARDIOVASCULAR: S1, S2 normal. No murmurs, rubs, or gallops.  ABDOMEN: Soft, nontender, nondistended. Bowel sounds present. No organomegaly or mass.  Foley catheter draining dark yellow urine.  No hematuria. EXTREMITIES: No cyanosis, clubbing or edema b/l.    NEUROLOGIC: Cranial nerves II through XII are intact. No focal Motor or sensory deficits b/l.   PSYCHIATRIC:  patient is alert and oriented x 3.  SKIN: No obvious rash, lesion, or ulcer.   LABORATORY PANEL:  CBC Recent Labs  Lab 02/08/17 0439  WBC 20.1*  HGB 10.6*  HCT 31.2*  PLT 139*    Chemistries  Recent Labs  Lab 02/07/17 1019 02/08/17 0439  NA 123* 125*  K 4.2 3.6  CL 87* 93*  CO2 19* 22  GLUCOSE 169* 133*  BUN 27* 33*  CREATININE 1.51* 1.28*  CALCIUM 9.2 8.4*  AST 54*  --   ALT 25  --   ALKPHOS 69  --   BILITOT 1.2  --    Cardiac Enzymes No results for input(s): TROPONINI in the last 168 hours. RADIOLOGY:  Dg Chest Port 1 View  Result Date: 02/08/2017 CLINICAL DATA:  Cough.  History of atrial fibrillation. EXAM: PORTABLE CHEST 1 VIEW COMPARISON:  None FINDINGS: There is moderate cardiac enlargement. Aortic atherosclerosis. Lung volumes are decreased. No pleural effusion or edema. No airspace opacities identified. IMPRESSION: 1. Low lung volumes. 2.  Aortic Atherosclerosis (ICD10-I70.0). 3. Cardiac enlargement. Electronically Signed   By: Kerby Moors M.D.   On: 02/08/2017 11:11   ASSESSMENT AND PLAN:  83 year old male with past medical history of chronic atrial fibrillation, BPH, hypertension, mild cognitive decline, history of internal hemorrhage, thrombocytopenia, osteoarthritis, non-insulin-dependent diabetes who presents to the  hospital due to hematuria.  1. Acute hematuria with urinary retention- Jeraldine Loots due to UTI.  Repeat UA shows too numerous to count WBCs.  -Patient's Foley is draining dark yellow urine no more hematuria.  - Patient does have a history of BPH. -Continue  supportive care with IV fluids. -Seen by Dr. Junious Silk urology recommends renal ultrasound, continue IV antibiotics -Spoke with Dr. Tobe Sos who was on call.  Since her urine is cleared out we will discontinue Foley, start Xarelto, continue IV antibiotics, and cystoscopy as outpatient  2. Hyponatremia-this is hypovolemic hyponatremia. - I will hold the patient's HCTZ.  -Gently hydrate the patient with IV fluids. Follow sodium.  3. Acute kidney injury-secondary to urinary retention.  D/c foley  4. History of chronic atrial fibrillation-rate still somewhat uncontrolled presently.  -I will continue his Cardizem.  -resume Xarelto now that hematuria resolving  5. Hyperlipidemia-continue Crestor.  6. Essential hypertension-continue Cardizem.  Hold HCTZ and lisinopril given the hyponatremia and acute kidney injury.  Physical therapy to see patient. DC to [facility] tomorrow if continues to show improvement. Chest x-ray today for dry cough.  Above was discussed with patient's daughter-in-law in the room  Case discussed with Care Management/Social Worker. Management plans discussed with the patient, family and they are in agreement.  CODE STATUS: DNR  DVT Prophylaxis: xarelto  TOTAL TIME TAKING CARE OF THIS PATIENT: *30* minutes.  >50% time spent on counselling and coordination of care  POSSIBLE D/C IN *1-2* DAYS, DEPENDING ON CLINICAL CONDITION.  Note: This dictation was prepared with Dragon dictation along with smaller phrase technology. Any transcriptional errors that result from this process are unintentional.  Fritzi Mandes M.D on 02/08/2017 at 12:07 PM  Between 7am to 6pm - Pager - [phone]  After 6pm go to [email or web address] - password [email or web address] [facility]  Office  [phone]  CC: Primary care physician; Venia Carbon, MD

## 2017-02-08 NOTE — Progress Notes (Addendum)
Speech Therapy Note: received order, reviewed chart notes indicating pt's alertness has declined somewhat since this morning. Pt does have dx of UTI and is of advanced age w/ h/o Dementia. He is currently on a regular consistency diet.  ST services recommended diet modification to a pureed diet w/ thin liquids w/ Strict Aspiration Precautions until the BSE can be completed. Recommended pt only be given po's if fully alert/awake. NSG updated on recommendations.    Orinda Kenner, [place], CCC-SLP

## 2017-02-08 NOTE — Progress Notes (Signed)
   02/08/17 1500  Clinical Encounter Type  Visited With Family;Patient not available  Visit Type Initial;Other (Comment) (Rapid Response)  Referral From Nurse  Spiritual Encounters  Spiritual Needs Emotional  [facility] responded to RR; [place] spoke to family member and indicated availability as needed

## 2017-02-08 NOTE — Clinical Social Work Note (Signed)
Clinical Social Work Assessment  Patient Details  Name: Jimmy Riggs. MRN: [id number] Date of Birth: October 11, 1924  Date of referral:  02/08/17               Reason for consult:  Discharge Planning                Permission sought to share information with:    Permission granted to share information::     Name::        Agency::     Relationship::     Contact Information:     Housing/Transportation Living arrangements for the past 2 months:  Assisted Living Facility(Memory Care) Source of Information:    Patient Interpreter Needed:  None Criminal Activity/Legal Involvement Pertinent to Current Situation/Hospitalization:  No - Comment as needed Significant Relationships:  Adult Children Lives with:  Facility Resident Do you feel safe going back to the place where you live?    Need for family participation in patient care:  Yes (Comment)  Care giving concerns:  Patient resides as long term care at [facility] memory care.   Social Worker assessment / plan:  Patient's daughter was at hospital and spoke with patient's nurse at length this morning talking of patient's likes and dislikes in order to care for him the best way.   CSW spoke with Sande Rives: [phone] and she stated that they would be able to accept patient back and that family more than likely will transport. CSW notified Raquel Sarna that the MD stated patient more than likely would be ready for discharge tomorrow.    Employment status:  Retired Forensic scientist:  Information systems manager, Medicaid In [place] PT Recommendations:    Information / Referral to community resources:     Patient/Family's Response to care:  Patient's daughter has been pleased with her father's care and has informed patient's nurse of this.  Patient/Family's Understanding of and Emotional Response to Diagnosis, Current Treatment, and Prognosis:  Patient's family is involved with treatment plan.  Emotional Assessment Appearance:     Attitude/Demeanor/Rapport:    Affect (typically observed):    Orientation:  Oriented to Self Alcohol / Substance use:  Not Applicable Psych involvement (Current and /or in the community):  No (Comment)  Discharge Needs  Concerns to be addressed:  Care Coordination Readmission within the last 30 days:  No Current discharge risk:  None Barriers to Discharge:  No Barriers Identified   Shela Leff, LCSW 02/08/2017, 11:48 AM

## 2017-02-08 NOTE — Progress Notes (Signed)
Patient is in the hospital for UTI and has history of dementia and A. Fib. I was on the floor and rapid response was activated by the family and nurses as patient was not looking very alert. I responded to that and ask in details to his son and daughter-in-law in the room. As per them yesterday and given today morning patient was more alert and communicative but later on since around 10:00 in the morning he is getting more and more drowsy he is barely talking a few words to stimuli but does not make much sense and preferred to keep his eyes closed. Nurses have contacted Dr. Posey Pronto about this issue earlier and she had ordered x-ray chest. Also noted that his urine culture was reporting antelope Coker's faecalis so she has requested to change to oral Augmentin.  Physical exam   During my visit patient is drowsy and responsive to stimuli by communicating few words but does not give clear answers or follow commands. He moves all 4 limbs a little but appears having generalized weakness. Pupils reactive and no gross focal weakness. Vitals are checked and they are stable. Heart rate stable on auscultation and no murmurs. No wheezing or crepitation on lung exam.  Lab results   I reviewed his white blood cell count has jumped up today, UA still shows numerous WBCs and leukocytes and nitrate. Ur cx have E fecalis. His INR was 2.7 yesterday, chest x-ray today is negative for any acute findings.   Fingerstick blood sugar is 96.  Assessment and plan   Altered mental status   Metabolic encephalopathy likely due to UTI    I explained patient's son and daughter-in-law are present in the room, most likely this is due to his UTI which was not properly covered by IV ceftriaxone what he was receiving until now but as we had already changed the antibiotic he is going to be covered properly by that.  I also explained to his son, him being on anticoagulant and therapeutic INR, risk of stroke is very low and I would  not consider that as his primary problem at this time. He understand and agree with not doing a CT scan at this time.   As patient is very lethargic he may not be able to take oral Augmentin which is ordered, so I will change it to IV Unasyn.  Time spent by me is 40 minutes on this patient.

## 2017-02-08 NOTE — Progress Notes (Signed)
PT Cancellation Note  Patient Details Name: [name]. MRN: [id number] DOB: 08/08/1924   Cancelled Treatment:    Reason Eval/Treat Not Completed: (Consult received and chart reviewed.  Per primary RN, patient confused, lethargic; unable to participate with therapy at this time.  Primary RN advises hold with re-attempt next date as medically appropriate.)   Kanani Mowbray H. Owens Shark, PT, DPT, NCS 02/08/17, 3:56 PM [phone]

## 2017-02-08 NOTE — Progress Notes (Signed)
02/08/2017  2:12 PM  Pt less arousable, not following commands as well as earlier in the day.  Spoke with attending physcian Posey Pronto, who suggested this is related to disease process of UTI.  No new orders or interventions at this time.  Will continue to monitor and assess. Dola Argyle, RN

## 2017-02-08 NOTE — Telephone Encounter (Signed)
-----   Message from Festus Aloe, MD sent at 02/07/2017  9:05 PM EST ----- Pt needs f/u next week from hospital (gross hematuria, urinary retention)

## 2017-02-09 ENCOUNTER — Telehealth: Payer: Self-pay | Admitting: Internal Medicine

## 2017-02-09 DIAGNOSIS — L8990 Pressure ulcer of unspecified site, unspecified stage: Secondary | ICD-10-CM

## 2017-02-09 LAB — BASIC METABOLIC PANEL
ANION GAP: 12 (ref 5–15)
BUN: 33 mg/dL — ABNORMAL HIGH (ref 6–20)
CALCIUM: 8.3 mg/dL — AB (ref 8.9–10.3)
CO2: 20 mmol/L — AB (ref 22–32)
Chloride: 93 mmol/L — ABNORMAL LOW (ref 101–111)
Creatinine, Ser: 1.22 mg/dL (ref 0.61–1.24)
GFR calc Af Amer: 58 mL/min — ABNORMAL LOW (ref >60)
GFR calc non Af Amer: 50 mL/min — ABNORMAL LOW (ref >60)
Glucose, Bld: 111 mg/dL — ABNORMAL HIGH (ref 65–99)
Potassium: 3.2 mmol/L — ABNORMAL LOW (ref 3.5–5.1)
Sodium: 125 mmol/L — ABNORMAL LOW (ref 135–145)

## 2017-02-09 LAB — CBC
HCT: 30.6 % — ABNORMAL LOW (ref 40.0–52.0)
HEMOGLOBIN: 10.3 g/dL — AB (ref 13.0–18.0)
MCH: 30.9 pg (ref 26.0–34.0)
MCHC: 33.8 g/dL (ref 32.0–36.0)
MCV: 91.5 fL (ref 80.0–100.0)
Platelets: 104 10*3/uL — ABNORMAL LOW (ref 150–440)
RBC: 3.34 MIL/uL — AB (ref 4.40–5.90)
RDW: 14.8 % — ABNORMAL HIGH (ref 11.5–14.5)
WBC: 18.3 10*3/uL — ABNORMAL HIGH (ref 3.8–10.6)

## 2017-02-09 LAB — URINE CULTURE

## 2017-02-09 MED ORDER — AMOXICILLIN-POT CLAVULANATE 400-57 MG/5ML PO SUSR: 800.0000 mg | Freq: Two times a day (BID) | ORAL | 0 refills | Status: DC

## 2017-02-09 MED ORDER — BENZONATATE 100 MG PO CAPS: 100.0000 mg | ORAL_CAPSULE | Freq: Two times a day (BID) | ORAL | 0 refills | Status: DC | PRN

## 2017-02-09 MED ORDER — AMOXICILLIN-POT CLAVULANATE 400-57 MG/5ML PO SUSR
800.0000 mg | Freq: Two times a day (BID) | ORAL | Status: DC
  Administered 2017-02-09: 800 mg via ORAL
  Filled 2017-02-09 (×2): qty 10

## 2017-02-09 NOTE — Evaluation (Addendum)
Clinical/Bedside Swallow Evaluation Patient Details  Name: Jimmy Riggs. MRN: [id number] Date of Birth: 05-28-1924  Today's Date: 02/09/2017 Time: SLP Start Time (ACUTE ONLY): 64 SLP Stop Time (ACUTE ONLY): 1150 SLP Time Calculation (min) (ACUTE ONLY): 60 min  Past Medical History:  Past Medical History:  Diagnosis Date  . Acute intra-cranial hemorrhage (HCC)    w/ coumadin  . Atrial fibrillation ([place])   . Benign prostatic hypertrophy   . Coronary artery disease   . Hyperlipidemia   . Hypertension   . Inguinal hernia   . Lymphoma ([place])   . Melanoma in situ of shoulder ([place])   . NIDDM (non-insulin dependent diabetes mellitus)   . Osteoarthritis   . S/P tonsillectomy and adenoidectomy 1931  . Thrombocytopenia ([place])    Past Surgical History:  Past Surgical History:  Procedure Laterality Date  . CATARACT EXTRACTION    . CHOLECYSTECTOMY    . TONSILLECTOMY AND ADENOIDECTOMY     HPI:  83 year old male with past medical history of chronic atrial fibrillation, BPH, hypertension, mild cognitive decline, history of internal hemorrhage, thrombocytopenia, osteoarthritis, non-insulin-dependent diabetes who presents to the hospital due to hematuria.   Assessment / Plan / Recommendation Clinical Impression  Pt appeared to present w/ mild oropharyngeal Dysphagia w/ mod risk for aspiration. Pt's daughter in law reported pt has been diagnosed w/ "Vascular Dementia" and has had "Cognitive decline for approx 1 year". Cognitive decline can increase risks of aspiration/choking hazards d/t lack of attention to task(impulsivity) and follow through w/ eating/drinking. ST noted a frequent hacking cough before po's were administered and daughter in law reported that pt was exhibiting the hacking cough "all day yesterday", 02/08/17 as well. Pt's daughter in law was present during evaluation.  Pt was given trials of thin liquid via cup, Nectar consistency liquid via cup, puree, and soft solids. During  trials of thin liqiud via cup, pt exhibited min difficulty w/ initiating the task of drinking from cup, however, once pt completed the action, it became more rhythmic. Pt exhibited timely A-P transit and swallow w/ adequate oral clearing. Pt was instructed by ST to take SMALL SINGLE SIPS SLOWLY, however, pt appeared to impulsively drink larger sips. ST reinforced small, single, sips slowly. Pt exhibited mild-mod coughing 2-3x during trials of thin liquid that seemed different from frequent hacking cough and related to thin liquid intake. No decline in vocal quality or respiratory status were noted. During trials of Nectar consistency liquid via cup, pt exhibited difficulty initiating the task of drinking from a cup, however, once task became rhythmic, pt was able to complete actions adequately. Pt exhibited timely A-P transit and swallow w/ adequate oral clearing. No overt, immediate s/s of aspiration were noted. No coughing appearing to be related to the liquid was noted, though pt did continue to have frequent hacking cough throughout this evaluation. No decline in vocal quality or respiratory status were noted. During trials of puree, pt exhibited adequate labial seal around spoon, timely A-P transit and swallow, w/ adequate oral clearing. No immediate, overt s/s of aspiration were noted. No decline in vocal quality or respiratory status were noted. During trials of soft solids, pt exhibited adequate mastication, timely A-P transit and swallow, w/ adequate oral clearing. No overt, immediate s/s of aspiration were noted. No decline in respiratory status or vocal quality were noted. Pt's family was educated on aspiration precautions including sitting upright during meals, reducing environmental distractions, small bites/sips,checking for oral clearing and slow pace.  Pt exhibited a hacking  cough throughout the evaluation as well as frequent belching which daughter in law says is "new" but unsure of pt's baseline  for this as he resides at a Memory Care/[facility]. Unsure if pt's mild cough response was related to his frequent mild belching.  D/t pt's overall presentation and Cogntive decline(baseline), Recommend Dysphagia 3 w/ Nectar consistency liquid; aspiration precautions - sitting upright during meals, reducing environmental distractions, small bites/sips,checking for oral clearing and slow pace. Recommend meds whole w/ puree. Recommend f/u from [facility] as well as PT and OT to address PLOF/LOF of ADLs.  ST services will continue to f/u w/ pt status, diet toleration, and education while admitted. Recommend ST services f/u w/ pt at Rehab for diet upgrade and/or Objective assessment w/ MBSS. NSG/MD updated.   SLP Visit Diagnosis: Dysphagia, oropharyngeal phase (R13.12)    Aspiration Risk  Moderate aspiration risk    Diet Recommendation   Dysphagia diet level 3 w/ Nectar consistency liquids; aspiration precautions.   Medication Administration: Whole meds with puree(crushed if needed) d/t Cognitive status at this time   Other  Recommendations Recommended Consults: (Dietician f/u; OT/PT f/u) Oral Care Recommendations: Oral care BID;Staff/trained caregiver to provide oral care Other Recommendations: Remove water pitcher  Follow up Recommendations Skilled Nursing facility w/ f/u w/ Skilled ST services for dysphagia therapy and assessment     Frequency and Duration min 2x/week  1 week-2 weeks       Prognosis Prognosis for Safe Diet Advancement: Good Barriers to Reach Goals: Cognitive deficits      Swallow Study   General Date of Onset: 02/07/17 HPI: 83 year old male with past medical history of chronic atrial fibrillation, BPH, hypertension, mild cognitive decline, history of internal hemorrhage, thrombocytopenia, osteoarthritis, non-insulin-dependent diabetes who presents to the hospital due to hematuria. Type of Study: Bedside Swallow Evaluation Previous Swallow Assessment: none given Diet Prior to  this Study: Dysphagia 1 (puree);Thin liquids Temperature Spikes Noted: No(wbc elevated) Respiratory Status: Room air History of Recent Intubation: No Behavior/Cognition: Cooperative;Pleasant mood;Confused;Lethargic/Drowsy;Requires cueing;Distractible Oral Cavity Assessment: Within Functional Limits Oral Care Completed by SLP: Recent completion by staff Oral Cavity - Dentition: Adequate natural dentition;Dentures, bottom(partial dentures) Vision: Functional for self-feeding Self-Feeding Abilities: Able to feed self;Total assist(hand over hand) Patient Positioning: Upright in bed Baseline Vocal Quality: Normal Volitional Cough: Strong(pt had hacking cough throughout session - before pos given) Volitional Swallow: Able to elicit    Oral/Motor/Sensory Function Overall Oral Motor/Sensory Function: Within functional limits   Ice Chips Ice chips: Not tested   Thin Liquid Thin Liquid: Impaired(6 trials) Presentation: Cup;Self Fed(hand over hand assist) Pharyngeal  Phase Impairments: Cough - Immediate(2-3x cough in addition to previous hacking cough)    Nectar Thick Nectar Thick Liquid: Within functional limits Presentation: Cup(2 oz) Other Comments: hacking cough did not increase in frequency or intensity during trials of nectar thick   Honey Thick Honey Thick Liquid: Not tested   Puree Puree: Within functional limits Presentation: Spoon   Solid   GO   Solid: Within functional limits Presentation: Self Fed(3 trials)       Carolynn Sayers, SLP-Graduate Student Carolynn Sayers 02/09/2017,1:22 PM    The information in this patient note, response to treatment, and overall treatment plan developed has been reviewed and agreed upon by this clinician.  Orinda Kenner, [place], [place] [phone] 02/09/17,2:54 PM

## 2017-02-09 NOTE — Progress Notes (Signed)
Per Md okay to leave out IV access as pt pulled it out, antibiotic changed to oral.

## 2017-02-09 NOTE — Progress Notes (Signed)
When patient was to be picked up from EMS. Temp was 100.9 auxillary and then 99 on recheck auxillary. Per Dr. Posey Pronto give pt tylenol and discharge.   Eli Phillips.  A and O x 1. VSS. Pt tolerating diet well. No complaints of pain or nausea. .Pt daughter in law voiced understanding of discharge instructions with no further questions. Pt discharged via wheelchair with EMS. Spoke with Manuela Schwartz at [facility] related to changes in diet and concerns from speech.   Lynann Bologna MSN, RN-BC  Allergies as of 02/09/2017   No Known Allergies     Medication List    STOP taking these medications   hydrochlorothiazide 25 MG tablet Commonly known as:  HYDRODIURIL   lisinopril 20 MG tablet Commonly known as:  PRINIVIL,ZESTRIL   lisinopril 5 MG tablet Commonly known as:  PRINIVIL,ZESTRIL     TAKE these medications   amoxicillin-clavulanate 400-57 MG/5ML suspension Commonly known as:  AUGMENTIN Take 10 mLs (800 mg total) by mouth every 12 (twelve) hours for 8 days.   benzonatate 100 MG capsule Commonly known as:  TESSALON Take 1 capsule (100 mg total) by mouth 2 (two) times daily as needed for cough.   diltiazem 120 MG 24 hr capsule Commonly known as:  CARDIZEM CD TAKE 1 CAPSULE BY MOUTH  DAILY   glucose blood test strip Commonly known as:  ONE TOUCH ULTRA TEST Use as instructed to test blood sugar once daily dx: E11.40   multivitamin tablet Take 1 tablet by mouth daily.   onetouch ultrasoft lancets Use as instructed to test blood sugar once daily dx: E11.49   rosuvastatin 5 MG tablet Commonly known as:  CRESTOR TAKE 1 TABLET BY MOUTH  EVERY OTHER DAY   triamcinolone cream 0.1 % Commonly known as:  KENALOG apply to affected area twice a day if needed   XARELTO 20 MG Tabs tablet Generic drug:  rivaroxaban TAKE 1 TABLET BY MOUTH ONCE DAILY WITH SUPPER       Vitals:   02/09/17 1152 02/09/17 1601  BP: 111/60 121/69  Pulse: (!) 108 (!) 119  Resp:  (!) 24  Temp: 98.1 F (36.7  C) 99 F (37.2 C)  SpO2:  99%

## 2017-02-09 NOTE — Telephone Encounter (Signed)
Spoke with Eddie Dibbles (Pt son)   He stated they were transferring pt back to [facility] via ambulance.  He stated his dad is functioning @ a lower level than when he went to hospital.  His is having problem opening eyes and drowsy.  Do you want pt to come here for follow up or are you going to see pt @ [facility]

## 2017-02-09 NOTE — Telephone Encounter (Signed)
Left message pt son Eddie Dibbles) to call office See below dr Silvio Pate message

## 2017-02-09 NOTE — Discharge Summary (Addendum)
[facility] NAME: Osama Coleson    MR#:  [id number]  DATE OF BIRTH:  [birth year]  DATE OF ADMISSION:  02/07/2017 ADMITTING PHYSICIAN: Henreitta Leber, MD  DATE OF DISCHARGE: 02/09/2017  PRIMARY CARE PHYSICIAN: Venia Carbon, MD    ADMISSION DIAGNOSIS:  Hyponatremia [E87.1] AKI (acute kidney injury) ([place]) [N17.9] Hematuria, unspecified type [R31.9]  DISCHARGE DIAGNOSIS:  Enterococcus UTI--leading to hematuria Hyponatremia from dehydration and HCTZ afib on Xarelto US renal shows right kidney irregular cyst---w/u per PCP as out pt SECONDARY DIAGNOSIS:   Past Medical History:  Diagnosis Date  . Acute intra-cranial hemorrhage (HCC)    w/ coumadin  . Atrial fibrillation ([place])   . Benign prostatic hypertrophy   . Coronary artery disease   . Hyperlipidemia   . Hypertension   . Inguinal hernia   . Lymphoma ([place])   . Melanoma in situ of shoulder ([place])   . NIDDM (non-insulin dependent diabetes mellitus)   . Osteoarthritis   . S/P tonsillectomy and adenoidectomy 1931  . Thrombocytopenia [facility])     HOSPITAL COURSE:   83 year old male with past medical history of chronic atrial fibrillation,BPH, hypertension, mild cognitive decline, history of internal hemorrhage, thrombocytopenia, osteoarthritis, non-insulin-dependent diabetes who presents to the hospital due to hematuria.  1. Acute hematuria with urinary retention- due to UTI.  Repeat UA shows too numerous to count WBCs.  -UC E fecium--Po Augmentin (total 10 days) -Patient's Foley is draining dark yellow urine no more hematuria. ---Foley removed - Patient does have a history of BPH. -Seen by Dr. Junious Silk urology recommends renal ultrasound, continue po antibiotics -resumed Xarelto, continue  Po antibiotics, and cystoscopy as outpatient -US renal shows right kidney irregular 2 cm cyst--Recommnends CT w and w/o contrast. -pt 's creatinine just improved with IVF and  holding meds. Will defer giving IV contrast this time and defer Dr Silvio Pate do further w/u if family wishes to pursue.  2. Hyponatremia-this is hypovolemic hyponatremia. -123--125--125 - will d/c  patient's HCTZ.  -received IV fluids. -per family pt had this in the past  3. Acute kidney injury-secondary to urinary retention.  D/c foley -creatinine back to baseline  4. History of chronic atrial fibrillation-rate still somewhat uncontrolled presently.  -I will continue his Cardizem.  -resume Xarelto now that hematuria resolving  5. Hyperlipidemia-continue Crestor.  6. Essential hypertension-continue Cardizem.  D/c  HCTZ and lisinopril given the hyponatremia and acute kidney injury and BP remaining on the softer side   DC to [facility] today  Above was discussed with patient's family  in the room    CONSULTS OBTAINED:  Treatment Team:  Festus Aloe, MD  DRUG ALLERGIES:  No Known Allergies  DISCHARGE MEDICATIONS:   Current Discharge Medication List    START taking these medications   Details  amoxicillin-clavulanate (AUGMENTIN) 400-57 MG/5ML suspension Take 10 mLs (800 mg total) by mouth every 12 (twelve) hours for 8 days. Qty: 100 mL, Refills: 0    benzonatate (TESSALON) 100 MG capsule Take 1 capsule (100 mg total) by mouth 2 (two) times daily as needed for cough. Qty: 20 capsule, Refills: 0      CONTINUE these medications which have NOT CHANGED   Details  diltiazem (CARDIZEM CD) 120 MG 24 hr capsule TAKE 1 CAPSULE BY MOUTH  DAILY Qty: 90 capsule, Refills: 1    Multiple Vitamin (MULTIVITAMIN) tablet Take 1 tablet by mouth daily.      rosuvastatin (CRESTOR) 5 MG tablet  TAKE 1 TABLET BY MOUTH  EVERY OTHER DAY Qty: 45 tablet, Refills: 3    XARELTO 20 MG TABS tablet TAKE 1 TABLET BY MOUTH ONCE DAILY WITH SUPPER Qty: 90 tablet, Refills: 3    glucose blood (ONE TOUCH ULTRA TEST) test strip Use as instructed to test blood sugar once daily dx: E11.40 Qty:  100 each, Refills: 3    Lancets (ONETOUCH ULTRASOFT) lancets Use as instructed to test blood sugar once daily dx: E11.49 Qty: 100 each, Refills: 3    triamcinolone cream (KENALOG) 0.1 % apply to affected area twice a day if needed Qty: 45 g, Refills: 2      STOP taking these medications     hydrochlorothiazide (HYDRODIURIL) 25 MG tablet      lisinopril (PRINIVIL,ZESTRIL) 5 MG tablet      lisinopril (PRINIVIL,ZESTRIL) 20 MG tablet         If you experience worsening of your admission symptoms, develop shortness of breath, life threatening emergency, suicidal or homicidal thoughts you must seek medical attention immediately by calling 911 or calling your MD immediately  if symptoms less severe.  You Must read complete instructions/literature along with all the possible adverse reactions/side effects for all the Medicines you take and that have been prescribed to you. Take any new Medicines after you have completely understood and accept all the possible adverse reactions/side effects.   Please note  You were cared for by a hospitalist during your hospital stay. If you have any questions about your discharge medications or the care you received while you were in the hospital after you are discharged, you can call the unit and asked to speak with the hospitalist on call if the hospitalist that took care of you is not available. Once you are discharged, your primary care physician will handle any further medical issues. Please note that NO REFILLS for any discharge medications will be authorized once you are discharged, as it is imperative that you return to your primary care physician (or establish a relationship with a primary care physician if you do not have one) for your aftercare needs so that they can reassess your need for medications and monitor your lab values. Today   SUBJECTIVE    Wide awake and alert eating magic cup VITAL SIGNS:  Blood pressure 117/67, pulse (!) 105,  temperature 98.5 F (36.9 C), temperature source Oral, resp. rate 18, height 5\' 7"  (1.702 m), weight 59 kg (130 lb), SpO2 98 %.  I/O:    Intake/Output Summary (Last 24 hours) at 02/09/2017 1009 Last data filed at 02/09/2017 0943 Gross per 24 hour  Intake 220 ml  Output 225 ml  Net -5 ml    PHYSICAL EXAMINATION:  GENERAL:  83 y.o.-year-old patient lying in the bed with no acute distress. Thin,cacehctic EYES: Pupils equal, round, reactive to light and accommodation. No scleral icterus. Extraocular muscles intact.  HEENT: Head atraumatic, normocephalic. Oropharynx and nasopharynx clear.  NECK:  Supple, no jugular venous distention. No thyroid enlargement, no tenderness.  LUNGS: Normal breath sounds bilaterally, no wheezing, rales,rhonchi or crepitation. No use of accessory muscles of respiration.  CARDIOVASCULAR: S1, S2 normal. No murmurs, rubs, or gallops.  ABDOMEN: Soft, non-tender, non-distended. Bowel sounds present. No organomegaly or mass.  EXTREMITIES: No pedal edema, cyanosis, or clubbing.  NEUROLOGIC: Cranial nerves II through XII are intact. Muscle strength 45 in all extremities. Sensation intact. Gait not checked.  PSYCHIATRIC: The patient is alert and awake SKIN: No obvious rash, lesion, or ulcer.  DATA REVIEW:   CBC  Recent Labs  Lab 02/09/17 0447  WBC 18.3*  HGB 10.3*  HCT 30.6*  PLT 104*    Chemistries  Recent Labs  Lab 02/07/17 1019  02/09/17 0447  NA 123*   < > 125*  K 4.2   < > 3.2*  CL 87*   < > 93*  CO2 19*   < > 20*  GLUCOSE 169*   < > 111*  BUN 27*   < > 33*  CREATININE 1.51*   < > 1.22  CALCIUM 9.2   < > 8.3*  AST 54*  --   --   ALT 25  --   --   ALKPHOS 69  --   --   BILITOT 1.2  --   --    < > = values in this interval not displayed.    Microbiology Results   Recent Results (from the past 240 hour(s))  Urine culture     Status: Abnormal   Collection Time: 02/07/17 10:19 AM  Result Value Ref Range Status   Specimen Description  URINE, RANDOM  Final   Special Requests NONE  Final   Culture >=100,000 COLONIES/mL ENTEROCOCCUS FAECALIS (A)  Final   Report Status 02/09/2017 FINAL  Final   Organism ID, Bacteria ENTEROCOCCUS FAECALIS (A)  Final      Susceptibility   Enterococcus faecalis - MIC*    AMPICILLIN <=2 SENSITIVE Sensitive     LEVOFLOXACIN 1 SENSITIVE Sensitive     NITROFURANTOIN <=16 SENSITIVE Sensitive     VANCOMYCIN 1 SENSITIVE Sensitive     * >=100,000 COLONIES/mL ENTEROCOCCUS FAECALIS  MRSA PCR Screening     Status: None   Collection Time: 02/07/17  6:30 PM  Result Value Ref Range Status   MRSA by PCR NEGATIVE NEGATIVE Final    Comment:        The GeneXpert MRSA Assay (FDA approved for NASAL specimens only), is one component of a comprehensive MRSA colonization surveillance program. It is not intended to diagnose MRSA infection nor to guide or monitor treatment for MRSA infections.     RADIOLOGY:  US Renal  Result Date: 02/08/2017 CLINICAL DATA:  Acute gross hematuria EXAM: RENAL / URINARY TRACT ULTRASOUND COMPLETE COMPARISON:  None. FINDINGS: Right Kidney: Length: 11.8 cm. Normal echogenicity. No hydronephrosis. Right kidney midpole corticomedullary junction ill-defined hypoechoic lesion noted measuring 2.1 x 1.7 x 1.8 cm. This remains indeterminate by ultrasound and may represent an irregular complex cyst. Consider follow-up evaluation with nonemergent CT without and with contrast. Left Kidney: Length: 11.6 cm. Echogenicity within normal limits. No mass or hydronephrosis visualized. Bladder: Not well distended but the wall does appear to be thickened measuring 11 mm. Indeterminate echogenicity along the anterior wall noted, nonspecific but may represent air if there is has been recent bladder instrumentation. Cystitis could have this appearance. IMPRESSION: Negative for hydronephrosis or acute renal finding. 2.1 cm right kidney ill-defined hypoechoic lesion suspicious for an irregular complex cyst  remains indeterminate. See above comment and recommendation. Under distended but thick walled bladder, compatible with cystitis. See above comment. Electronically Signed   By: Jerilynn Mages.  Shick M.D.   On: 02/08/2017 13:48   Dg Chest Port 1 View  Result Date: 02/08/2017 CLINICAL DATA:  Cough.  History of atrial fibrillation. EXAM: PORTABLE CHEST 1 VIEW COMPARISON:  None FINDINGS: There is moderate cardiac enlargement. Aortic atherosclerosis. Lung volumes are decreased. No pleural effusion or edema. No airspace opacities identified. IMPRESSION: 1. Low lung volumes.  2.  Aortic Atherosclerosis (ICD10-I70.0). 3. Cardiac enlargement. Electronically Signed   By: Kerby Moors M.D.   On: 02/08/2017 11:11     Management plans discussed with the patient, family and they are in agreement.  CODE STATUS:     Code Status Orders  (From admission, onward)        Start     Ordered   02/07/17 1535  Do not attempt resuscitation (DNR)  Continuous    Question Answer Comment  In the event of cardiac or respiratory ARREST Do not call a "code blue"   In the event of cardiac or respiratory ARREST Do not perform Intubation, CPR, defibrillation or ACLS   In the event of cardiac or respiratory ARREST Use medication by any route, position, wound care, and other measures to relive pain and suffering. May use oxygen, suction and manual treatment of airway obstruction as needed for comfort.      02/07/17 1534    Code Status History    Date Active Date Inactive Code Status Order ID Comments User Context   This patient has a current code status but no historical code status.    Advance Directive Documentation     Most Recent Value  Type of Advance Directive  Healthcare Power of Attorney, Living will, Out of facility DNR (pink MOST or yellow form)  Pre-existing out of facility DNR order (yellow form or pink MOST form)  No data  "MOST" Form in Place?  No data      TOTAL TIME TAKING CARE OF THIS PATIENT: *40* minutes.     Fritzi Mandes M.D on 02/09/2017 at 10:09 AM  Between 7am to 6pm - Pager - [phone] After 6pm go to [email or web address] - password [email or web address] [facility]  Office  [phone]  CC: Primary care physician; Venia Carbon, MD

## 2017-02-09 NOTE — Telephone Encounter (Signed)
He does not need appt here. Let son know that Rollene Fare will check on him tomorrow morning at [facility] and give him a call.

## 2017-02-09 NOTE — Care Management Important Message (Signed)
Important Message  Patient Details  Name: Jimmy Riggs. MRN: [id number] Date of Birth: 1925/01/08   Medicare Important Message Given:  Yes    Beverly Sessions, RN 02/09/2017, 11:27 AM

## 2017-02-09 NOTE — Clinical Social Work Note (Signed)
MD to discharge patient back to [facility] today. Raquel Sarna at [facility] is aware and discharge information sent . Family requests transport via EMS. Shela Leff MSW,LCSW [phone]

## 2017-02-09 NOTE — NC FL2 (Signed)
[place] LEVEL OF CARE SCREENING TOOL     IDENTIFICATION  Patient Name: Gwyn Mehring. Birthdate: 1924-12-13 Sex: male Admission Date (Current Location): 02/07/2017  [place] and [place] Number:  Engineering geologist and Address:  [facility], [address] [place], [place] [address]      Provider Number: [id number]  Attending Physician Name and Address:  Fritzi Mandes, MD  Relative Name and Phone Number:       Current Level of Care: Hospital Recommended Level of Care: Memory Care Prior Approval Number:    Date Approved/Denied:   PASRR Number:    Discharge Plan: ([place])    Current Diagnoses:Dementia Patient Active Problem List   Diagnosis Date Noted  . Pressure injury of skin 02/09/2017  . Hematuria 02/07/2017  . Malnutrition of mild degree ([place]) 11/17/2016  . Vascular dementia without behavioral disturbance 11/10/2015  . Thrombocytopenia ([place])   . Preventative health care 09/29/2014  . Advance directive discussed with patient 09/29/2014  . Diabetes, polyneuropathy ([place]) 04/08/2013  . Osteoarthritis of left knee 06/09/2011  . Type 2 diabetes, controlled, with peripheral neuropathy ([place]) 07/28/2006  . Hyperlipemia 07/28/2006  . Essential hypertension, benign 07/28/2006  . Coronary atherosclerosis of native coronary artery 07/28/2006  . Atrial fibrillation ([place]) 07/28/2006  . BPH with obstruction/lower urinary tract symptoms 07/28/2006    Orientation RESPIRATION BLADDER Height & Weight     Self  Normal Continent Weight: 130 lb (59 kg) Height:  5\' 7"  (170.2 cm)  BEHAVIORAL SYMPTOMS/MOOD NEUROLOGICAL BOWEL NUTRITION STATUS  (none) (none) Incontinent Diet(dysphagia 3)  AMBULATORY STATUS COMMUNICATION OF NEEDS Skin   Limited Assist Verbally Normal                       Personal Care Assistance Level of Assistance  Bathing, Dressing Bathing Assistance: Limited assistance   Dressing Assistance: Limited  assistance     Functional Limitations Info  Hearing   Hearing Info: Impaired      SPECIAL CARE FACTORS FREQUENCY                       Contractures Contractures Info: Not present    Additional Factors Info  Code Status Code Status Info: dnr             DISCHARGE MEDICATIONS:       Current Discharge Medication List        START taking these medications   Details  amoxicillin-clavulanate (AUGMENTIN) 400-57 MG/5ML suspension Take 10 mLs (800 mg total) by mouth every 12 (twelve) hours for 8 days. Qty: 100 mL, Refills: 0    benzonatate (TESSALON) 100 MG capsule Take 1 capsule (100 mg total) by mouth 2 (two) times daily as needed for cough. Qty: 20 capsule, Refills: 0          CONTINUE these medications which have NOT CHANGED   Details  diltiazem (CARDIZEM CD) 120 MG 24 hr capsule TAKE 1 CAPSULE BY MOUTH  DAILY Qty: 90 capsule, Refills: 1    Multiple Vitamin (MULTIVITAMIN) tablet Take 1 tablet by mouth daily.      rosuvastatin (CRESTOR) 5 MG tablet TAKE 1 TABLET BY MOUTH  EVERY OTHER DAY Qty: 45 tablet, Refills: 3    XARELTO 20 MG TABS tablet TAKE 1 TABLET BY MOUTH ONCE DAILY WITH SUPPER Qty: 90 tablet, Refills: 3    glucose blood (ONE TOUCH ULTRA TEST) test strip Use as instructed to test blood  sugar once daily dx: E11.40 Qty: 100 each, Refills: 3    Lancets (ONETOUCH ULTRASOFT) lancets Use as instructed to test blood sugar once daily dx: E11.49 Qty: 100 each, Refills: 3    triamcinolone cream (KENALOG) 0.1 % apply to affected area twice a day if needed Qty: 45 g, Refills: 2         STOP taking these medications     hydrochlorothiazide (HYDRODIURIL) 25 MG tablet      lisinopril (PRINIVIL,ZESTRIL) 5 MG tablet      lisinopril (PRINIVIL,ZESTRIL) 20 MG tablet          Additional Information    Shela Leff, LCSW

## 2017-02-09 NOTE — Telephone Encounter (Signed)
Can I use same day appointment  15 or 30 min appointment??   Copied from [place]. Topic: Appointment Scheduling - Scheduling Inquiry for Clinic >> Feb 09, 2017 10:15 AM Bea Graff, NT wrote: Reason for CRM: Per Rena to send a CRM to front office: Tishia from [facility] called to schedule an hospital follow-up for 1 week from today with Dr. Silvio Pate for this pt. Dr. Silvio Pate has no available 30 min slots for next week. Pt will be discharged from the hospital today. Please call pt to schedule appt.

## 2017-02-09 NOTE — Telephone Encounter (Signed)
15 minute slot, please

## 2017-02-10 ENCOUNTER — Telehealth: Payer: Self-pay | Admitting: *Deleted

## 2017-02-10 ENCOUNTER — Telehealth: Payer: Self-pay | Admitting: Internal Medicine

## 2017-02-10 DIAGNOSIS — R339 Retention of urine, unspecified: Secondary | ICD-10-CM

## 2017-02-10 DIAGNOSIS — I959 Hypotension, unspecified: Secondary | ICD-10-CM | POA: Diagnosis not present

## 2017-02-10 DIAGNOSIS — N390 Urinary tract infection, site not specified: Secondary | ICD-10-CM

## 2017-02-10 DIAGNOSIS — E871 Hypo-osmolality and hyponatremia: Secondary | ICD-10-CM

## 2017-02-10 DIAGNOSIS — N251 Nephrogenic diabetes insipidus: Secondary | ICD-10-CM

## 2017-02-10 NOTE — Telephone Encounter (Signed)
Seen today at [facility] by Roslynn Amble spoke to son

## 2017-02-10 NOTE — Telephone Encounter (Deleted)
Copied from [place] [id number]. Topic: General - Other >> Feb 10, 2017 10:48 AM Oneta Rack wrote: Caller name: Mrs [facility]  Relation to pt: spouse  Call back number: Pharmacy:  Reason for call:  Spouse wanted to discuss patient office visit with PCP on 02/07/17, spouse inquiring about memory loss, please advise

## 2017-02-10 NOTE — Telephone Encounter (Signed)
Caller name: Reetz,Paul Relation to pt: son  Call back number:[phone]   Reason for call:  Please disregard initial message,  son was returning TCM call, please advise

## 2017-02-10 NOTE — Telephone Encounter (Signed)
I do not see DPR to speak with Mrs Buelah Manis; also do not see where pt had OV on 02/07/17;  Pt was seen at [facility] ED on 02/07/17.

## 2017-02-10 NOTE — Telephone Encounter (Signed)
Copied from [place]. Topic: Quick Communication - Office Called Patient >> Feb 10, 2017 10:25 AM Modena Nunnery, CMA wrote: Reason for CRM: Lm requesting return call to complete TCM and confirm hosp f/u appt

## 2017-02-10 NOTE — Telephone Encounter (Addendum)
Caller name:  Cokley,Paul Relation to pt: son  Call back number: [phone]   Reason for call:  Son returning call and states he spoke with a "Rollene Fare" from PCP office regarding patient hospital follow up, son states please follow up with him at [phone]

## 2017-02-11 ENCOUNTER — Emergency Department: Payer: Medicare Other

## 2017-02-11 ENCOUNTER — Encounter: Payer: Self-pay | Admitting: Emergency Medicine

## 2017-02-11 ENCOUNTER — Inpatient Hospital Stay
Admission: EM | Admit: 2017-02-11 | Discharge: 2017-02-15 | DRG: 871 | Disposition: A | Discharge status: Skilled Nursing Facility | Payer: Medicare Other | Attending: Internal Medicine | Admitting: Internal Medicine

## 2017-02-11 ENCOUNTER — Other Ambulatory Visit: Payer: Self-pay

## 2017-02-11 ENCOUNTER — Inpatient Hospital Stay: Payer: Medicare Other

## 2017-02-11 DIAGNOSIS — J690 Pneumonitis due to inhalation of food and vomit: Secondary | ICD-10-CM | POA: Diagnosis present

## 2017-02-11 DIAGNOSIS — A419 Sepsis, unspecified organism: Principal | ICD-10-CM | POA: Diagnosis present

## 2017-02-11 DIAGNOSIS — Z7901 Long term (current) use of anticoagulants: Secondary | ICD-10-CM

## 2017-02-11 DIAGNOSIS — F0150 Vascular dementia without behavioral disturbance: Secondary | ICD-10-CM | POA: Diagnosis present

## 2017-02-11 DIAGNOSIS — R4182 Altered mental status, unspecified: Secondary | ICD-10-CM | POA: Diagnosis not present

## 2017-02-11 DIAGNOSIS — I712 Thoracic aortic aneurysm, without rupture: Secondary | ICD-10-CM | POA: Diagnosis present

## 2017-02-11 DIAGNOSIS — J9600 Acute respiratory failure, unspecified whether with hypoxia or hypercapnia: Secondary | ICD-10-CM | POA: Diagnosis present

## 2017-02-11 DIAGNOSIS — E871 Hypo-osmolality and hyponatremia: Secondary | ICD-10-CM | POA: Diagnosis present

## 2017-02-11 DIAGNOSIS — Y95 Nosocomial condition: Secondary | ICD-10-CM | POA: Diagnosis present

## 2017-02-11 DIAGNOSIS — N281 Cyst of kidney, acquired: Secondary | ICD-10-CM | POA: Diagnosis present

## 2017-02-11 DIAGNOSIS — I4891 Unspecified atrial fibrillation: Secondary | ICD-10-CM | POA: Diagnosis not present

## 2017-02-11 DIAGNOSIS — E785 Hyperlipidemia, unspecified: Secondary | ICD-10-CM | POA: Diagnosis present

## 2017-02-11 DIAGNOSIS — R918 Other nonspecific abnormal finding of lung field: Secondary | ICD-10-CM | POA: Diagnosis present

## 2017-02-11 DIAGNOSIS — R0603 Acute respiratory distress: Secondary | ICD-10-CM | POA: Diagnosis not present

## 2017-02-11 DIAGNOSIS — I2510 Atherosclerotic heart disease of native coronary artery without angina pectoris: Secondary | ICD-10-CM | POA: Diagnosis present

## 2017-02-11 DIAGNOSIS — J189 Pneumonia, unspecified organism: Secondary | ICD-10-CM | POA: Diagnosis not present

## 2017-02-11 DIAGNOSIS — Z66 Do not resuscitate: Secondary | ICD-10-CM | POA: Diagnosis present

## 2017-02-11 DIAGNOSIS — N179 Acute kidney failure, unspecified: Secondary | ICD-10-CM | POA: Diagnosis present

## 2017-02-11 DIAGNOSIS — R1310 Dysphagia, unspecified: Secondary | ICD-10-CM | POA: Diagnosis present

## 2017-02-11 DIAGNOSIS — E876 Hypokalemia: Secondary | ICD-10-CM | POA: Diagnosis not present

## 2017-02-11 DIAGNOSIS — I482 Chronic atrial fibrillation: Secondary | ICD-10-CM | POA: Diagnosis present

## 2017-02-11 DIAGNOSIS — N183 Chronic kidney disease, stage 3 (moderate): Secondary | ICD-10-CM | POA: Diagnosis present

## 2017-02-11 DIAGNOSIS — R0600 Dyspnea, unspecified: Secondary | ICD-10-CM | POA: Diagnosis not present

## 2017-02-11 DIAGNOSIS — Z87891 Personal history of nicotine dependence: Secondary | ICD-10-CM

## 2017-02-11 DIAGNOSIS — Z7401 Bed confinement status: Secondary | ICD-10-CM | POA: Diagnosis not present

## 2017-02-11 DIAGNOSIS — Z8249 Family history of ischemic heart disease and other diseases of the circulatory system: Secondary | ICD-10-CM | POA: Diagnosis not present

## 2017-02-11 DIAGNOSIS — I129 Hypertensive chronic kidney disease with stage 1 through stage 4 chronic kidney disease, or unspecified chronic kidney disease: Secondary | ICD-10-CM | POA: Diagnosis present

## 2017-02-11 DIAGNOSIS — J9601 Acute respiratory failure with hypoxia: Secondary | ICD-10-CM | POA: Diagnosis present

## 2017-02-11 DIAGNOSIS — E1122 Type 2 diabetes mellitus with diabetic chronic kidney disease: Secondary | ICD-10-CM | POA: Diagnosis present

## 2017-02-11 DIAGNOSIS — D696 Thrombocytopenia, unspecified: Secondary | ICD-10-CM | POA: Diagnosis present

## 2017-02-11 DIAGNOSIS — R05 Cough: Secondary | ICD-10-CM | POA: Diagnosis not present

## 2017-02-11 DIAGNOSIS — J90 Pleural effusion, not elsewhere classified: Secondary | ICD-10-CM | POA: Diagnosis not present

## 2017-02-11 DIAGNOSIS — Z8572 Personal history of non-Hodgkin lymphomas: Secondary | ICD-10-CM

## 2017-02-11 LAB — CBC WITH DIFFERENTIAL/PLATELET
BASOS ABS: 0 10*3/uL (ref 0–0.1)
BLASTS: 0 %
Band Neutrophils: 3 %
Basophils Relative: 0 %
EOS ABS: 0.2 10*3/uL (ref 0–0.7)
Eosinophils Relative: 1 %
HEMATOCRIT: 31.9 % — AB (ref 40.0–52.0)
HEMOGLOBIN: 10.8 g/dL — AB (ref 13.0–18.0)
Lymphocytes Relative: 5 %
Lymphs Abs: 0.9 10*3/uL — ABNORMAL LOW (ref 1.0–3.6)
MCH: 31.2 pg (ref 26.0–34.0)
MCHC: 33.8 g/dL (ref 32.0–36.0)
MCV: 92.1 fL (ref 80.0–100.0)
METAMYELOCYTES PCT: 0 %
MONOS PCT: 12 %
Monocytes Absolute: 2.2 10*3/uL — ABNORMAL HIGH (ref 0.2–1.0)
Myelocytes: 0 %
NEUTROS ABS: 15.3 10*3/uL — AB (ref 1.4–6.5)
Neutrophils Relative %: 79 %
Other: 0 %
PROMYELOCYTES ABS: 0 %
Platelets: 100 10*3/uL — ABNORMAL LOW (ref 150–440)
RBC: 3.46 MIL/uL — AB (ref 4.40–5.90)
RDW: 15.2 % — AB (ref 11.5–14.5)
WBC: 18.6 10*3/uL — AB (ref 3.8–10.6)
nRBC: 1 /100 WBC — ABNORMAL HIGH

## 2017-02-11 LAB — BASIC METABOLIC PANEL
ANION GAP: 10 (ref 5–15)
BUN: 47 mg/dL — ABNORMAL HIGH (ref 6–20)
CALCIUM: 8.2 mg/dL — AB (ref 8.9–10.3)
CO2: 20 mmol/L — ABNORMAL LOW (ref 22–32)
Chloride: 98 mmol/L — ABNORMAL LOW (ref 101–111)
Creatinine, Ser: 1.56 mg/dL — ABNORMAL HIGH (ref 0.61–1.24)
GFR, EST AFRICAN AMERICAN: 43 mL/min — AB (ref >60)
GFR, EST NON AFRICAN AMERICAN: 37 mL/min — AB (ref >60)
Glucose, Bld: 198 mg/dL — ABNORMAL HIGH (ref 65–99)
Potassium: 3 mmol/L — ABNORMAL LOW (ref 3.5–5.1)
SODIUM: 128 mmol/L — AB (ref 135–145)

## 2017-02-11 LAB — LACTIC ACID, PLASMA
LACTIC ACID, VENOUS: 2.8 mmol/L — AB (ref 0.5–1.9)
LACTIC ACID, VENOUS: 2.4 mmol/L — AB (ref 0.5–1.9)

## 2017-02-11 LAB — TROPONIN I: TROPONIN I: 0.03 ng/mL — AB (ref <0.03)

## 2017-02-11 LAB — BRAIN NATRIURETIC PEPTIDE: B Natriuretic Peptide: 741 pg/mL — ABNORMAL HIGH (ref 0.0–100.0)

## 2017-02-11 LAB — OSMOLALITY: Osmolality: 284 mOsm/kg (ref 275–295)

## 2017-02-11 MED ORDER — SODIUM CHLORIDE 0.9% FLUSH
3.0000 mL | Freq: Two times a day (BID) | INTRAVENOUS | Status: DC
  Administered 2017-02-12 – 2017-02-15 (×6): 3 mL via INTRAVENOUS

## 2017-02-11 MED ORDER — VANCOMYCIN HCL IN DEXTROSE 1-5 GM/200ML-% IV SOLN
1000.0000 mg | INTRAVENOUS | Status: DC
  Filled 2017-02-11: qty 200

## 2017-02-11 MED ORDER — ACETAMINOPHEN 325 MG PO TABS: 650.0000 mg | ORAL_TABLET | Freq: Four times a day (QID) | ORAL | Status: DC | PRN

## 2017-02-11 MED ORDER — PIPERACILLIN-TAZOBACTAM 3.375 G IVPB
3.3750 g | Freq: Three times a day (TID) | INTRAVENOUS | Status: DC
  Administered 2017-02-12 – 2017-02-15 (×10): 3.375 g via INTRAVENOUS
  Filled 2017-02-11 (×10): qty 50

## 2017-02-11 MED ORDER — IPRATROPIUM-ALBUTEROL 0.5-2.5 (3) MG/3ML IN SOLN
RESPIRATORY_TRACT | Status: AC
  Administered 2017-02-11: 5 mL via RESPIRATORY_TRACT
  Filled 2017-02-11: qty 6

## 2017-02-11 MED ORDER — SODIUM CHLORIDE 0.9% FLUSH: 3.0000 mL | INTRAVENOUS | Status: DC | PRN

## 2017-02-11 MED ORDER — RIVAROXABAN 15 MG PO TABS
15.0000 mg | ORAL_TABLET | Freq: Every day | ORAL | Status: DC
  Administered 2017-02-12 – 2017-02-15 (×4): 15 mg via ORAL
  Filled 2017-02-11 (×4): qty 1

## 2017-02-11 MED ORDER — FUROSEMIDE 10 MG/ML IJ SOLN
20.0000 mg | Freq: Two times a day (BID) | INTRAMUSCULAR | Status: DC
  Administered 2017-02-12: 20 mg via INTRAVENOUS
  Filled 2017-02-11 (×2): qty 2

## 2017-02-11 MED ORDER — SODIUM CHLORIDE 0.9 % IV BOLUS (SEPSIS)
1000.0000 mL | Freq: Once | INTRAVENOUS | Status: AC
  Administered 2017-02-11: 1000 mL via INTRAVENOUS

## 2017-02-11 MED ORDER — IPRATROPIUM-ALBUTEROL 0.5-2.5 (3) MG/3ML IN SOLN
5.0000 mL | Freq: Once | RESPIRATORY_TRACT | Status: AC
  Administered 2017-02-11: 5 mL via RESPIRATORY_TRACT

## 2017-02-11 MED ORDER — PIPERACILLIN-TAZOBACTAM 3.375 G IVPB 30 MIN
3.3750 g | Freq: Once | INTRAVENOUS | Status: AC
  Administered 2017-02-11: 3.375 g via INTRAVENOUS
  Filled 2017-02-11: qty 50

## 2017-02-11 MED ORDER — IPRATROPIUM-ALBUTEROL 0.5-2.5 (3) MG/3ML IN SOLN
3.0000 mL | RESPIRATORY_TRACT | Status: DC
  Administered 2017-02-11 – 2017-02-12 (×3): 3 mL via RESPIRATORY_TRACT
  Filled 2017-02-11 (×3): qty 3

## 2017-02-11 MED ORDER — BENZONATATE 100 MG PO CAPS: 100.0000 mg | ORAL_CAPSULE | Freq: Two times a day (BID) | ORAL | Status: DC | PRN

## 2017-02-11 MED ORDER — ONDANSETRON HCL 4 MG/2ML IJ SOLN: 4.0000 mg | Freq: Four times a day (QID) | INTRAMUSCULAR | Status: DC | PRN

## 2017-02-11 MED ORDER — ROSUVASTATIN CALCIUM 10 MG PO TABS
5.0000 mg | ORAL_TABLET | ORAL | Status: DC
  Administered 2017-02-12 – 2017-02-14 (×2): 5 mg via ORAL
  Filled 2017-02-11 (×4): qty 1

## 2017-02-11 MED ORDER — GUAIFENESIN 100 MG/5ML PO SOLN
10.0000 mL | Freq: Once | ORAL | Status: AC
  Administered 2017-02-11: 200 mg via ORAL
  Filled 2017-02-11 (×2): qty 10

## 2017-02-11 MED ORDER — ONDANSETRON HCL 4 MG PO TABS: 4.0000 mg | ORAL_TABLET | Freq: Four times a day (QID) | ORAL | Status: DC | PRN

## 2017-02-11 MED ORDER — GUAIFENESIN ER 600 MG PO TB12
600.0000 mg | ORAL_TABLET | Freq: Two times a day (BID) | ORAL | Status: DC
  Administered 2017-02-12 – 2017-02-15 (×8): 600 mg via ORAL
  Filled 2017-02-11 (×8): qty 1

## 2017-02-11 MED ORDER — ACETAMINOPHEN 325 MG PO TABS: 650.0000 mg | ORAL_TABLET | ORAL | Status: DC | PRN

## 2017-02-11 MED ORDER — SODIUM CHLORIDE 0.9 % IV BOLUS (SEPSIS): 1000.0000 mL | Freq: Once | INTRAVENOUS | Status: DC

## 2017-02-11 MED ORDER — ACETAMINOPHEN 650 MG RE SUPP: 650.0000 mg | Freq: Four times a day (QID) | RECTAL | Status: DC | PRN

## 2017-02-11 MED ORDER — VANCOMYCIN HCL IN DEXTROSE 1-5 GM/200ML-% IV SOLN
1000.0000 mg | Freq: Once | INTRAVENOUS | Status: AC
  Administered 2017-02-11: 1000 mg via INTRAVENOUS
  Filled 2017-02-11: qty 200

## 2017-02-11 MED ORDER — ALBUTEROL SULFATE (2.5 MG/3ML) 0.083% IN NEBU: 2.5000 mg | INHALATION_SOLUTION | Freq: Once | RESPIRATORY_TRACT | Status: DC

## 2017-02-11 MED ORDER — DILTIAZEM HCL ER COATED BEADS 120 MG PO CP24
120.0000 mg | ORAL_CAPSULE | Freq: Every day | ORAL | Status: DC
  Administered 2017-02-12 – 2017-02-14 (×3): 120 mg via ORAL
  Filled 2017-02-11 (×4): qty 1

## 2017-02-11 MED ORDER — SODIUM CHLORIDE 0.9 % IV SOLN: 250.0000 mL | INTRAVENOUS | Status: DC | PRN

## 2017-02-11 MED ORDER — RIVAROXABAN 20 MG PO TABS: 20.0000 mg | ORAL_TABLET | Freq: Every day | ORAL | Status: DC

## 2017-02-11 MED ORDER — METHYLPREDNISOLONE SODIUM SUCC 125 MG IJ SOLR
60.0000 mg | Freq: Four times a day (QID) | INTRAMUSCULAR | Status: DC
  Administered 2017-02-12: 60 mg via INTRAVENOUS
  Filled 2017-02-11 (×2): qty 2

## 2017-02-11 NOTE — ED Provider Notes (Signed)
[facility] Emergency Department Provider Note ____________________________________________   First MD Initiated Contact with Patient 02/11/17 1746     (approximate)  I have reviewed the triage vital signs and the nursing notes.   HISTORY  Chief Complaint Cough  History of present illness limited due to dementia.  HPI Jimmy Riggs. is a 83 y.o. male with past medical history as noted below and recent admission in the hospital for UTI who presents with cough since last night which is productive, and associated with wheezing and shortness of breath.  Patient is demented and is unable to provide any other history.  Past Medical History:  Diagnosis Date  . Acute intra-cranial hemorrhage (HCC)    w/ coumadin  . Atrial fibrillation ([place])   . Benign prostatic hypertrophy   . Coronary artery disease   . Hyperlipidemia   . Hypertension   . Inguinal hernia   . Lymphoma ([place])   . Melanoma in situ of shoulder ([place])   . NIDDM (non-insulin dependent diabetes mellitus)   . Osteoarthritis   . S/P tonsillectomy and adenoidectomy 1931  . Thrombocytopenia [facility])     Patient Active Problem List   Diagnosis Date Noted  . Pressure injury of skin 02/09/2017  . Hematuria 02/07/2017  . Malnutrition of mild degree ([place]) 11/17/2016  . Vascular dementia without behavioral disturbance 11/10/2015  . Thrombocytopenia ([place])   . Preventative health care 09/29/2014  . Advance directive discussed with patient 09/29/2014  . Diabetes, polyneuropathy ([place]) 04/08/2013  . Osteoarthritis of left knee 06/09/2011  . Type 2 diabetes, controlled, with peripheral neuropathy ([place]) 07/28/2006  . Hyperlipemia 07/28/2006  . Essential hypertension, benign 07/28/2006  . Coronary atherosclerosis of native coronary artery 07/28/2006  . Atrial fibrillation ([place]) 07/28/2006  . BPH with obstruction/lower urinary tract symptoms 07/28/2006    Past Surgical History:  Procedure Laterality  Date  . CATARACT EXTRACTION    . CHOLECYSTECTOMY    . TONSILLECTOMY AND ADENOIDECTOMY      Prior to Admission medications   Medication Sig Start Date End Date Taking? Authorizing Provider  acetaminophen (TYLENOL) 325 MG tablet Take 650 mg by mouth every 4 (four) hours as needed.   Yes [provider]  amoxicillin-clavulanate (AUGMENTIN) 400-57 MG/5ML suspension Take 10 mLs (800 mg total) by mouth every 12 (twelve) hours for 8 days. 02/09/17 02/17/17 Yes Fritzi Mandes, MD  benzonatate (TESSALON) 100 MG capsule Take 1 capsule (100 mg total) by mouth 2 (two) times daily as needed for cough. 02/09/17  Yes Fritzi Mandes, MD  diltiazem (CARDIZEM CD) 120 MG 24 hr capsule TAKE 1 CAPSULE BY MOUTH  DAILY 12/12/16  Yes Viviana Simpler I, MD  glucose blood (ONE TOUCH ULTRA TEST) test strip Use as instructed to test blood sugar once daily dx: E11.40 10/14/14  Yes Venia Carbon, MD  Lancets [facility] ULTRASOFT) lancets Use as instructed to test blood sugar once daily dx: E11.49 03/20/14  Yes Venia Carbon, MD  Multiple Vitamin (MULTIVITAMIN) tablet Take 1 tablet by mouth daily.     Yes [provider]  rosuvastatin (CRESTOR) 5 MG tablet TAKE 1 TABLET BY MOUTH  EVERY OTHER DAY 12/19/16  Yes Viviana Simpler I, MD  triamcinolone cream (KENALOG) 0.1 % apply to affected area twice a day if needed 02/18/15  Yes Venia Carbon, MD  XARELTO 20 MG TABS tablet TAKE 1 TABLET BY MOUTH ONCE DAILY WITH SUPPER 02/02/16  Yes Venia Carbon, MD    Allergies Patient has  no known allergies.  Family History  Problem Relation Age of Onset  . Heart failure Mother   . Diabetes Mother   . Hypertension Mother   . Prostate cancer Father   . Arthritis Sister     Social History Social History   Tobacco Use  . Smoking status: Former Smoker    Packs/day: 0.00    Years: 20.00    Pack years: 0.00    Last attempt to quit: 03/14/1980    Years since quitting: 36.9  . Smokeless tobacco: Never Used    Substance Use Topics  . Alcohol use: Yes    Comment: socially  . Drug use: No    Review of Systems Level V caveat: Unable to obtain review of systems due to dementia.    ____________________________________________   PHYSICAL EXAM:  VITAL SIGNS: ED Triage Vitals [02/11/17 1736]  Enc Vitals Group     BP      Pulse      Resp      Temp (!) 97.1 F (36.2 C)     Temp Source Oral     SpO2      Weight 130 lb (59 kg)     Height 5\' 7"  (1.702 m)     Head Circumference      Peak Flow      Pain Score      Pain Loc      Pain Edu?      Excl. in [place]?     Constitutional: Alert, knows name.  Frail appearing.  Eyes: Conjunctivae are normal.  Head: Atraumatic. Nose: No congestion/rhinnorhea. Mouth/Throat: Mucous membranes are dry.   Neck: Normal range of motion.  Cardiovascular: Normal rate, regular rhythm. Grossly normal heart sounds.  Good peripheral circulation. Respiratory: Increased respiratory effort.  Bilateral faint wheeze and coarse breath sounds. Gastrointestinal: Soft and nontender. No distention.  Genitourinary: No CVA tenderness. Musculoskeletal: No lower extremity edema.  Extremities warm and well perfused.  Neurologic:  Normal speech. Motor intact in all extremities.  No gross focal neurologic deficits are appreciated.  Skin:  Skin is warm and dry. No rash noted. Psychiatric: Unable to assess.   ____________________________________________   LABS (all labs ordered are listed, but only abnormal results are displayed)  Labs Reviewed  CBC WITH DIFFERENTIAL/PLATELET - Abnormal; Notable for the following components:      Result Value   WBC 18.6 (*)    RBC 3.46 (*)    Hemoglobin 10.8 (*)    HCT 31.9 (*)    RDW 15.2 (*)    Platelets 100 (*)    nRBC 1 (*)    Neutro Abs 15.3 (*)    Lymphs Abs 0.9 (*)    Monocytes Absolute 2.2 (*)    All other components within normal limits  BRAIN NATRIURETIC PEPTIDE - Abnormal; Notable for the following components:   B  Natriuretic Peptide 741.0 (*)    All other components within normal limits  LACTIC ACID, PLASMA - Abnormal; Notable for the following components:   Lactic Acid, Venous 2.8 (*)    All other components within normal limits  TROPONIN I - Abnormal; Notable for the following components:   Troponin I 0.03 (*)    All other components within normal limits  CULTURE, BLOOD (ROUTINE X 2)  CULTURE, BLOOD (ROUTINE X 2)  URINALYSIS, COMPLETE (UACMP) WITH MICROSCOPIC  LACTIC ACID, PLASMA  INFLUENZA PANEL BY PCR (TYPE A & B)  BASIC METABOLIC PANEL   ____________________________________________  EKG  ED ECG  REPORT I, Arta Silence, the attending physician, personally viewed and interpreted this ECG.  Date: 02/11/2017 EKG Time: 1741 Rate: 96 Rhythm: Atrial fibrillation QRS Axis: normal Intervals: Right bundle branch block, left anterior fascicular block ST/T Wave abnormalities: Nonspecific Narrative Interpretation: no evidence of acute ischemia; no significant change when compared to EKG of 09/20/2013  ____________________________________________  RADIOLOGY  CXR: No focal infiltrate or other acute findings.  ____________________________________________   PROCEDURES  Procedure(s) performed: No    Critical Care performed: Yes  CRITICAL CARE Performed by: Arta Silence   Total critical care time: 35 minutes  Critical care time was exclusive of separately billable procedures and treating other patients.  Critical care was necessary to treat or prevent imminent or life-threatening deterioration.  Critical care was time spent personally by me on the following activities: development of treatment plan with patient and/or surrogate as well as nursing, discussions with consultants, evaluation of patient's response to treatment, examination of patient, obtaining history from patient or surrogate, ordering and performing treatments and interventions, ordering and review of  laboratory studies, ordering and review of radiographic studies, pulse oximetry and re-evaluation of patient's condition.  ____________________________________________   INITIAL IMPRESSION / ASSESSMENT AND PLAN / ED COURSE  Pertinent labs & imaging results that were available during my care of the patient were reviewed by me and considered in my medical decision making (see chart for details).  83 year old male with past medical history as noted above presents with shortness of breath, productive cough, and wheezing since last night, as well as a fever last night per nursing home report.  Patient has dementia and is unable to provide any relevant history.  Review of past medical records in [place] reveals that patient was admitted to the hospital from 11/27-11/29 for hyponatremia, AKI, and urinary tract infection.  Patient is on an antibiotic for this.    On exam, patient is coughing, wheezing bilaterally, and is tachypneic with mild respiratory distress.  Differential includes pneumonia, bronchitis, less likely CHF or ACS.  Given the presence of cough and wheezing, there is no evidence for PE.  Plan for chest x-ray, labs, sepsis workup, and reassess.    ----------------------------------------- 8:09 PM on 02/11/2017 -----------------------------------------  On reassessment, patient's respiratory status is markedly improved, although he remains slightly tachypneic.  X-ray shows no focal infiltrate, however white count remains elevated, and he has elevated lactate suggesting sepsis.  Unclear if this is a respiratory process versus related to his known UTI.  Broad-spectrum antibiotics started, fluids initiated, and I will proceed with readmitting the patient.  Will sign out to the hospitalist.  ____________________________________________   FINAL CLINICAL IMPRESSION(S) / ED DIAGNOSES  Final diagnoses:  Respiratory distress  Sepsis, due to unspecified organism [facility])      NEW MEDICATIONS  STARTED DURING THIS VISIT:  This SmartLink is deprecated. Use AVSMEDLIST instead to display the medication list for a patient.   Note:  This document was prepared using Dragon voice recognition software and may include unintentional dictation errors.     Arta Silence, MD 02/11/17 2011

## 2017-02-11 NOTE — ED Notes (Signed)
Transport to [facility]

## 2017-02-11 NOTE — Progress Notes (Signed)
Xarelto changed to 15 mg daily for a-fib indication per MM 710-02.  Sim Boast, PharmD, BCPS  02/11/17 11:09 PM

## 2017-02-11 NOTE — ED Triage Notes (Signed)
Cough since thurs - was in the hospital for 2 days prior to that for uti

## 2017-02-11 NOTE — ED Notes (Signed)
Pt back from ct

## 2017-02-11 NOTE — Progress Notes (Signed)
CODE SEPSIS - PHARMACY COMMUNICATION  **Broad Spectrum Antibiotics should be administered within 1 hour of Sepsis diagnosis**  Time Code Sepsis Called/Page Received: 19:11  Antibiotics Ordered: Zosyn/Vanc  Time of 1st antibiotic administration: 19:15  Additional action taken by pharmacy: PTD abx entered  If necessary, Name of Provider/Nurse Contacted:   Laural Benes ,PharmD, BCPS Clinical Pharmacist  02/11/2017  7:36 PM

## 2017-02-11 NOTE — H&P (Addendum)
[place] [facility] [place] NAME: Jimmy Riggs    MR#:  [id number]  DATE OF BIRTH:  1924-08-17  DATE OF ADMISSION:  02/11/2017  PRIMARY CARE PHYSICIAN: Venia Carbon, MD   REQUESTING/REFERRING PHYSICIAN: Arta Silence, MD  CHIEF COMPLAINT:   Chief Complaint  Patient presents with  . Cough    HISTORY OF PRESENT ILLNESS: Jimmy Riggs  is a 83 y.o. male with a known history of atrial fibrillation, coronary artery disease, hyperlipidemia, essential hypertension, non-insulin-dependent diabetes who was actually hospitalized and discharged 2 days ago after he was admitted with hematuria associated with a urinary tract infection.  Patient also at that time was noted to have hyponatremia.  During that hospitalization he received IV fluids.  Patient was discharged on oral antibiotics.  According to his family members he started having wheezing the second day after being admitted he also started having cough which was new for him.  Patient also has been having on and off fevers since discharge.  Patient's got progressive shortness of breath and wheezing at the facility where he was staying.  Therefore brought to the emergency room.  Patient is a poor historian and his son was able to provide all the history. PAST MEDICAL HISTORY:   Past Medical History:  Diagnosis Date  . Acute intra-cranial hemorrhage (HCC)    w/ coumadin  . Atrial fibrillation ([place])   . Benign prostatic hypertrophy   . Coronary artery disease   . Hyperlipidemia   . Hypertension   . Inguinal hernia   . Lymphoma ([place])   . Melanoma in situ of shoulder ([place])   . NIDDM (non-insulin dependent diabetes mellitus)   . Osteoarthritis   . S/P tonsillectomy and adenoidectomy 1931  . Thrombocytopenia ([place])     PAST SURGICAL HISTORY:  Past Surgical History:  Procedure Laterality Date  . CATARACT EXTRACTION    . CHOLECYSTECTOMY    . TONSILLECTOMY AND ADENOIDECTOMY      SOCIAL HISTORY:   Social History   Tobacco Use  . Smoking status: Former Smoker    Packs/day: 0.00    Years: 20.00    Pack years: 0.00    Last attempt to quit: 03/14/1980    Years since quitting: 36.9  . Smokeless tobacco: Never Used  Substance Use Topics  . Alcohol use: Yes    Comment: socially    FAMILY HISTORY:  Family History  Problem Relation Age of Onset  . Heart failure Mother   . Diabetes Mother   . Hypertension Mother   . Prostate cancer Father   . Arthritis Sister     DRUG ALLERGIES: No Known Allergies  REVIEW OF SYSTEMS:   CONSTITUTIONAL: Positive fever, fatigue or weakness.  EYES: No blurred or double vision.  EARS, NOSE, AND THROAT: No tinnitus or ear pain.  RESPIRATORY: Positive cough, positive shortness of breath, positive wheezing or no hemoptysis.  CARDIOVASCULAR: No chest pain, orthopnea, edema.  GASTROINTESTINAL: No nausea, vomiting, diarrhea or abdominal pain.  GENITOURINARY: No dysuria, hematuria.  ENDOCRINE: No polyuria, nocturia,  HEMATOLOGY: No anemia, easy bruising or bleeding SKIN: No rash or lesion. MUSCULOSKELETAL: No joint pain or arthritis.   NEUROLOGIC: No tingling, numbness, weakness.  PSYCHIATRY: No anxiety or depression.   MEDICATIONS AT HOME:  Prior to Admission medications   Medication Sig Start Date End Date Taking? Authorizing Provider  acetaminophen (TYLENOL) 325 MG tablet Take 650 mg by mouth every 4 (four) hours as needed.   Yes [provider]  amoxicillin-clavulanate (AUGMENTIN) 400-57 MG/5ML suspension Take 10 mLs (800 mg total) by mouth every 12 (twelve) hours for 8 days. 02/09/17 02/17/17 Yes Fritzi Mandes, MD  benzonatate (TESSALON) 100 MG capsule Take 1 capsule (100 mg total) by mouth 2 (two) times daily as needed for cough. 02/09/17  Yes Fritzi Mandes, MD  diltiazem (CARDIZEM CD) 120 MG 24 hr capsule TAKE 1 CAPSULE BY MOUTH  DAILY 12/12/16  Yes Viviana Simpler I, MD  glucose blood (ONE TOUCH ULTRA TEST) test strip Use as instructed  to test blood sugar once daily dx: E11.40 10/14/14  Yes Venia Carbon, MD  Lancets [facility] ULTRASOFT) lancets Use as instructed to test blood sugar once daily dx: E11.49 03/20/14  Yes Venia Carbon, MD  Multiple Vitamin (MULTIVITAMIN) tablet Take 1 tablet by mouth daily.     Yes [provider]  rosuvastatin (CRESTOR) 5 MG tablet TAKE 1 TABLET BY MOUTH  EVERY OTHER DAY 12/19/16  Yes Viviana Simpler I, MD  triamcinolone cream (KENALOG) 0.1 % apply to affected area twice a day if needed 02/18/15  Yes [place], Richard I, MD  XARELTO 20 MG TABS tablet TAKE 1 TABLET BY MOUTH ONCE DAILY WITH SUPPER 02/02/16  Yes Venia Carbon, MD      PHYSICAL EXAMINATION:   VITAL SIGNS: Blood pressure 118/62, pulse (!) 131, temperature (!) 97.1 F (36.2 C), temperature source Oral, resp. rate (!) 29, height 5\' 7"  (1.702 m), weight 130 lb (59 kg), SpO2 100 %.  GENERAL:  83 y.o.-year-old patient lying in the bed with no acute distress.  EYES: Pupils equal, round, reactive to light and accommodation. No scleral icterus. Extraocular muscles intact.  HEENT: Head atraumatic, normocephalic. Oropharynx and nasopharynx clear.  NECK:  Supple, no jugular venous distention. No thyroid enlargement, no tenderness.  LUNGS: Bilateral wheezing throughout both lungs no accessory muscle usage CARDIOVASCULAR: S1, S2 normal. No murmurs, rubs, or gallops.  ABDOMEN: Soft, nontender, nondistended. Bowel sounds present. No organomegaly or mass.  EXTREMITIES: No pedal edema, cyanosis, or clubbing.  NEUROLOGIC: Cranial nerves II through XII are intact. Muscle strength 5/5 in all extremities. Sensation intact. Gait not checked.  PSYCHIATRIC: The patient is alert   SKIN: No obvious rash, lesion, or ulcer.   LABORATORY PANEL:   CBC Recent Labs  Lab 02/07/17 1019 02/08/17 0439 02/09/17 0447 02/11/17 1754  WBC 11.8* 20.1* 18.3* 18.6*  HGB 11.0* 10.6* 10.3* 10.8*  HCT 32.5* 31.2* 30.6* 31.9*  PLT 160 139* 104* 100*   MCV 92.6 91.9 91.5 92.1  MCH 31.5 31.3 30.9 31.2  MCHC 34.0 34.0 33.8 33.8  RDW 14.6* 14.5 14.8* 15.2*  LYMPHSABS  --   --   --  0.9*  MONOABS  --   --   --  2.2*  EOSABS  --   --   --  0.2  BASOSABS  --   --   --  0.0   ------------------------------------------------------------------------------------------------------------------  Chemistries  Recent Labs  Lab 02/07/17 1019 02/08/17 0439 02/09/17 0447  NA 123* 125* 125*  K 4.2 3.6 3.2*  CL 87* 93* 93*  CO2 19* 22 20*  GLUCOSE 169* 133* 111*  BUN 27* 33* 33*  CREATININE 1.51* 1.28* 1.22  CALCIUM 9.2 8.4* 8.3*  AST 54*  --   --   ALT 25  --   --   ALKPHOS 69  --   --   BILITOT 1.2  --   --    ------------------------------------------------------------------------------------------------------------------ estimated creatinine clearance is  33.6 mL/min (by C-G formula based on SCr of 1.22 mg/dL). ------------------------------------------------------------------------------------------------------------------ No results for input(s): TSH, T4TOTAL, T3FREE, THYROIDAB in the last 72 hours.  Invalid input(s): FREET3   Coagulation profile Recent Labs  Lab 02/07/17 1041  INR 2.73   ------------------------------------------------------------------------------------------------------------------- No results for input(s): DDIMER in the last 72 hours. -------------------------------------------------------------------------------------------------------------------  Cardiac Enzymes Recent Labs  Lab 02/11/17 1754  TROPONINI 0.03*   ------------------------------------------------------------------------------------------------------------------ Invalid input(s): POCBNP  ---------------------------------------------------------------------------------------------------------------  Urinalysis    Component Value Date/Time   COLORURINE YELLOW (A) 02/08/2017 1019   APPEARANCEUR CLOUDY (A) 02/08/2017 1019   LABSPEC  1.015 02/08/2017 1019   PHURINE 5.0 02/08/2017 1019   GLUCOSEU NEGATIVE 02/08/2017 1019   HGBUR LARGE (A) 02/08/2017 1019   BILIRUBINUR NEGATIVE 02/08/2017 1019   BILIRUBINUR negative 12/20/2016 1611   KETONESUR NEGATIVE 02/08/2017 1019   PROTEINUR 100 (A) 02/08/2017 1019   UROBILINOGEN 1.0 12/20/2016 1611   NITRITE NEGATIVE 02/08/2017 1019   LEUKOCYTESUR LARGE (A) 02/08/2017 1019     RADIOLOGY: Dg Chest Portable 1 View  Result Date: 02/11/2017 CLINICAL DATA:  Cough for 2 days. EXAM: PORTABLE CHEST 1 VIEW COMPARISON:  01/31/2017 chest radiograph FINDINGS: Cardiomegaly noted. Mild low volume film with mild bibasilar atelectasis again noted. There is no evidence of focal airspace disease, pulmonary edema, suspicious pulmonary nodule/mass, pleural effusion, or pneumothorax. No acute bony abnormalities are identified. IMPRESSION: Cardiomegaly with mild bibasilar atelectasis Electronically Signed   By: Margarette Canada M.D.   On: 02/11/2017 18:34    EKG: Orders placed or performed during the hospital encounter of 02/11/17  . ED EKG  . ED EKG  . EKG 12-Lead  . EKG 12-Lead    IMPRESSION AND PLAN: Patient is a 83 year old admitted with acute respiratory failure  1.  Acute respiratory failure Possibly due to fluid overload although chest x-ray is negative at this time I will try low-dose IV Lasix follow-up renal function tomorrow if worsens stop Lasix Also possible pneumonia we will treat as HCAP Obtain speech eval for swallowing dysfunction Due to wheezing I will place patient on nebulizer therapy as well as steroids Obtain CT scan of the chest chest x-ray is negative  2.  Fever with sepsis elevated WBC count follow-up urinalysis broad-spectrum antibiotics as above  3.  Hyponatremia we will check serum and urine osmolalities  4.  Chronic atrial fibrillation continue Xarelto and Cardizem  5.  CODE STATUS DNR  All the records are reviewed and case discussed with ED  provider. Management plans discussed with the patient, family and they are in agreement.  CODE STATUS: Code Status History    Date Active Date Inactive Code Status Order ID Comments User Context   02/07/2017 15:34 02/09/2017 19:49 DNR [id number]  Henreitta Leber, MD Inpatient    Questions for Most Recent Historical Code Status (Order [id number])    Question Answer Comment   In the event of cardiac or respiratory ARREST Do not call a "code blue"    In the event of cardiac or respiratory ARREST Do not perform Intubation, CPR, defibrillation or ACLS    In the event of cardiac or respiratory ARREST Use medication by any route, position, wound care, and other measures to relive pain and suffering. May use oxygen, suction and manual treatment of airway obstruction as needed for comfort.         Advance Directive Documentation     Most Recent Value  Type of Advance Directive  Out of facility DNR (pink MOST or yellow form)  Pre-existing out of facility DNR order (yellow form or pink MOST form)  Yellow form placed in chart (order not valid for inpatient use)  "MOST" Form in Place?  No data       TOTAL TIME TAKING CARE OF THIS PATIENT55 minutes.    Dustin Flock M.D on 02/11/2017 at 8:44 PM  Between 7am to 6pm - Pager - [phone]  After 6pm go to [email or web address] - password [email or web address] [place] [facility]  Office  [phone]  CC: Primary care physician; Venia Carbon, MD

## 2017-02-12 ENCOUNTER — Inpatient Hospital Stay
Admit: 2017-02-12 | Discharge: 2017-02-12 | Disposition: A | Discharge status: Home / Self Care | Payer: Medicare Other | Attending: Internal Medicine | Admitting: Internal Medicine

## 2017-02-12 ENCOUNTER — Inpatient Hospital Stay: Payer: Medicare Other

## 2017-02-12 LAB — BASIC METABOLIC PANEL
Anion gap: 11 (ref 5–15)
BUN: 47 mg/dL — AB (ref 6–20)
CHLORIDE: 98 mmol/L — AB (ref 101–111)
CO2: 20 mmol/L — AB (ref 22–32)
Calcium: 8.4 mg/dL — ABNORMAL LOW (ref 8.9–10.3)
Creatinine, Ser: 1.47 mg/dL — ABNORMAL HIGH (ref 0.61–1.24)
GFR calc Af Amer: 47 mL/min — ABNORMAL LOW (ref >60)
GFR calc non Af Amer: 40 mL/min — ABNORMAL LOW (ref >60)
Glucose, Bld: 175 mg/dL — ABNORMAL HIGH (ref 65–99)
POTASSIUM: 3 mmol/L — AB (ref 3.5–5.1)
SODIUM: 129 mmol/L — AB (ref 135–145)

## 2017-02-12 LAB — CBC
HEMATOCRIT: 26.6 % — AB (ref 40.0–52.0)
HEMOGLOBIN: 9 g/dL — AB (ref 13.0–18.0)
MCH: 30.5 pg (ref 26.0–34.0)
MCHC: 33.7 g/dL (ref 32.0–36.0)
MCV: 90.5 fL (ref 80.0–100.0)
Platelets: 78 10*3/uL — ABNORMAL LOW (ref 150–440)
RBC: 2.94 MIL/uL — AB (ref 4.40–5.90)
RDW: 14.9 % — ABNORMAL HIGH (ref 11.5–14.5)
WBC: 12.6 10*3/uL — ABNORMAL HIGH (ref 3.8–10.6)

## 2017-02-12 LAB — URINALYSIS, COMPLETE (UACMP) WITH MICROSCOPIC
Bacteria, UA: NONE SEEN
Bilirubin Urine: NEGATIVE
Glucose, UA: NEGATIVE mg/dL
Hgb urine dipstick: NEGATIVE
KETONES UR: NEGATIVE mg/dL
NITRITE: NEGATIVE
PH: 5 (ref 5.0–8.0)
PROTEIN: 30 mg/dL — AB
RBC / HPF: NONE SEEN RBC/hpf (ref 0–5)
SPECIFIC GRAVITY, URINE: 1.012 (ref 1.005–1.030)

## 2017-02-12 LAB — ECHOCARDIOGRAM COMPLETE
FS: 36 % (ref 28–44)
HEIGHTINCHES: 67 in
IVS/LV PW RATIO, ED: 1.05
LA vol A4C: 97.2 ml
LADIAMINDEX: 2.75 cm/m2
LASIZE: 48 mm
LAVOL: 106 mL
LAVOLIN: 60.7 mL/m2
LEFT ATRIUM END SYS DIAM: 48 mm
LV PW d: 11.7 mm — AB (ref 0.6–1.1)
P 1/2 time: 666 ms
RV LATERAL S' VELOCITY: 9.79 cm/s
TAPSE: 16.5 mm
WEIGHTICAEL: 2270.4 [oz_av]

## 2017-02-12 LAB — OSMOLALITY, URINE: Osmolality, Ur: 362 mOsm/kg (ref 300–900)

## 2017-02-12 LAB — MAGNESIUM: MAGNESIUM: 2 mg/dL (ref 1.7–2.4)

## 2017-02-12 LAB — INFLUENZA PANEL BY PCR (TYPE A & B)
INFLBPCR: NEGATIVE
Influenza A By PCR: NEGATIVE

## 2017-02-12 MED ORDER — POTASSIUM CHLORIDE CRYS ER 20 MEQ PO TBCR
40.0000 meq | EXTENDED_RELEASE_TABLET | Freq: Once | ORAL | Status: AC
Start: 2017-02-12 — End: 2017-02-12
  Administered 2017-02-12: 11:00:00 40 meq via ORAL
  Filled 2017-02-12: qty 2

## 2017-02-12 MED ORDER — METHYLPREDNISOLONE SODIUM SUCC 40 MG IJ SOLR
40.0000 mg | Freq: Two times a day (BID) | INTRAMUSCULAR | Status: DC
  Administered 2017-02-12 – 2017-02-13 (×2): 40 mg via INTRAVENOUS
  Filled 2017-02-12 (×2): qty 1

## 2017-02-12 MED ORDER — IPRATROPIUM-ALBUTEROL 0.5-2.5 (3) MG/3ML IN SOLN
3.0000 mL | Freq: Four times a day (QID) | RESPIRATORY_TRACT | Status: DC
  Administered 2017-02-12 (×2): 3 mL via RESPIRATORY_TRACT
  Filled 2017-02-12 (×2): qty 3

## 2017-02-12 MED ORDER — ORAL CARE MOUTH RINSE
15.0000 mL | Freq: Two times a day (BID) | OROMUCOSAL | Status: DC
  Administered 2017-02-12 – 2017-02-15 (×6): 15 mL via OROMUCOSAL

## 2017-02-12 NOTE — Progress Notes (Signed)
Pharmacy Antibiotic Note  Jimmy Riggs. is a 83 y.o. male admitted on 02/11/2017 with pneumonia.  Pharmacy has been consulted for Zosyn dosing.  Plan: Zosyn 3.375g IV q8h (4 hour infusion).  Height: 5\' 7"  (170.2 cm) Weight: 141 lb 14.4 oz (64.4 kg) IBW/kg (Calculated) : 66.1  Temp (24hrs), Avg:97.9 F (36.6 C), Min:97.1 F (36.2 C), Max:98.3 F (36.8 C)  Recent Labs  Lab 02/07/17 1019 02/08/17 0439 02/09/17 0447 02/11/17 1754 02/11/17 2231 02/12/17 0617  WBC 11.8* 20.1* 18.3* 18.6*  --  12.6*  CREATININE 1.51* 1.28* 1.22  --  1.56* 1.47*  LATICACIDVEN  --   --   --  2.8* 2.4*  --     Estimated Creatinine Clearance: 30.4 mL/min (A) (by C-G formula based on SCr of 1.47 mg/dL (H)).    No Known Allergies  Antimicrobials this admission: vanc 12/1 >>12/2 Zosyn 12/1 >>   Dose adjustments this admission:   Microbiology results: 12/1 BCx: NGTD 12/2 UCx: sent    Thank you for allowing pharmacy to be a part of this patient's care.  Rocky Morel 02/12/2017 4:17 PM

## 2017-02-12 NOTE — Progress Notes (Signed)
Physical Therapy Evaluation Patient Details Name: Jimmy Riggs. MRN: [id number] DOB: [birth year] Today's Date: 02/12/2017   History of Present Illness  Jimmy Riggs  is a 83 y.o. male with a known history of atrial fibrillation, coronary artery disease, hyperlipidemia, essential hypertension, non-insulin-dependent diabetes who was actually hospitalized and discharged 2 days ago after he was admitted with hematuria associated with a urinary tract infection.  Patient also at that time was noted to have hyponatremia.  During that hospitalization he received IV fluids.  Patient was discharged on oral antibiotics.  According to his family members he started having wheezing the second day after being admitted he also started having cough which was new for him.  Patient also has been having on and off fevers since discharge.  Patient's got progressive shortness of breath and wheezing at the facility where he was staying  Clinical Impression  Patient is 83 year old male with recent hospitalization DC. He has decreased strength BLE -3/5, needs mod assist with bed mobility, and mod assist with sit to stand transfers with RW.  Patients sitting balance is fair with need of UE support, and dynamic standing balance is poor with need of UE support with RW. Patient has O2 saturation 99 % with O2,  in sitting and standing and increased HR 106 BPM. Gait was not assessed due to patient having a BM and needing to be cleaned up and changed. Patient will benefit from skilled PT to progress ambulation and perform strengthening exercises to improve mobility.    Follow Up Recommendations SNF    Equipment Recommendations  Rolling walker with 5" wheels    Recommendations for Other Services       Precautions / Restrictions Restrictions Weight Bearing Restrictions: No      Mobility  Bed Mobility Overal bed mobility: Needs Assistance Bed Mobility: Supine to Sit     Supine to sit: Mod assist     General bed  mobility comments: (Patient needs assist to get into sitting, he can scoot EOB)  Transfers Overall transfer level: Needs assistance Equipment used: Rolling walker (2 wheeled)             General transfer comment: Patient needs VC and min assist with RW  Ambulation/Gait Ambulation/Gait assistance: (unable to perform due to BM)              Stairs            Wheelchair Mobility    Modified Rankin (Stroke Patients Only)       Balance Overall balance assessment: Needs assistance Sitting-balance support: Bilateral upper extremity supported Sitting balance-Leahy Scale: Good     Standing balance support: Bilateral upper extremity supported Standing balance-Leahy Scale: Fair                               Pertinent Vitals/Pain Pain Assessment: No/denies pain    Home Living Family/patient expects to be discharged to:: Skilled nursing facility                      Prior Function Level of Independence: Independent with assistive device(s)               Hand Dominance        Extremity/Trunk Assessment   Upper Extremity Assessment Upper Extremity Assessment: Overall WFL for tasks assessed    Lower Extremity Assessment Lower Extremity Assessment: Overall WFL for tasks assessed  Communication      Cognition Arousal/Alertness: Awake/alert Behavior During Therapy: WFL for tasks assessed/performed Overall Cognitive Status: Within Functional Limits for tasks assessed                                        General Comments      Exercises     Assessment/Plan    PT Assessment Patient needs continued PT services  PT Problem List Decreased strength;Decreased activity tolerance;Decreased balance;Decreased mobility;Decreased knowledge of use of DME       PT Treatment Interventions Gait training;Functional mobility training;Therapeutic activities;Therapeutic exercise;Balance training;Patient/family  education    PT Goals (Current goals can be found in the Care Plan section)  Acute Rehab PT Goals Patient Stated Goal: wants to walk PT Goal Formulation: With patient Time For Goal Achievement: 03/03/17 Potential to Achieve Goals: Good    Frequency Min 2X/week   Barriers to discharge ([facility])      Co-evaluation               AM-PAC PT "6 Clicks" Daily Activity  Outcome Measure Difficulty turning over in bed (including adjusting bedclothes, sheets and blankets)?: Unable Difficulty moving from lying on back to sitting on the side of the bed? : Unable Difficulty sitting down on and standing up from a chair with arms (e.g., wheelchair, bedside commode, etc,.)?: Unable Help needed moving to and from a bed to chair (including a wheelchair)?: A Lot Help needed walking in hospital room?: A Lot Help needed climbing 3-5 steps with a railing? : Total 6 Click Score: 8    End of Session Equipment Utilized During Treatment: Gait belt;Oxygen Activity Tolerance: Patient limited by fatigue Patient left: in bed(CNA assist due to BM)   PT Visit Diagnosis: Difficulty in walking, not elsewhere classified (R26.2);Unsteadiness on feet (R26.81);Muscle weakness (generalized) (M62.81)    Time: 8032-1224 PT Time Calculation (min) (ACUTE ONLY): 15 min   Charges:   PT Evaluation $PT Eval Low Complexity: 1 Low     PT G Codes:   PT G-Codes **NOT FOR INPATIENT CLASS** Functional Assessment Tool Used: AM-PAC 6 Clicks Basic Mobility;Clinical judgement Functional Limitation: Mobility: Walking and moving around Mobility: Walking and Moving Around Current Status (M2500): At least 80 percent but less than 100 percent impaired, limited or restricted Mobility: Walking and Moving Around Goal Status [phone]): At least 80 percent but less than 100 percent impaired, limited or restricted      Alanson Puls, PT DPT 02/12/2017, 11:35 AM

## 2017-02-12 NOTE — Plan of Care (Signed)
  Progressing Education: Knowledge of General Education information will improve 02/12/2017 1829 - Progressing by Daylene Posey, RN Health Behavior/Discharge Planning: Ability to manage health-related needs will improve 02/12/2017 1829 - Progressing by Daylene Posey, RN Clinical Measurements: Ability to maintain clinical measurements within normal limits will improve 02/12/2017 1829 - Progressing by Daylene Posey, RN Will remain free from infection 02/12/2017 1829 - Progressing by Daylene Posey, RN Diagnostic test results will improve 02/12/2017 1829 - Progressing by Daylene Posey, RN Respiratory complications will improve 02/12/2017 1829 - Progressing by Daylene Posey, RN Cardiovascular complication will be avoided 02/12/2017 1829 - Progressing by Daylene Posey, RN Activity: Risk for activity intolerance will decrease 02/12/2017 1829 - Progressing by Daylene Posey, RN Nutrition: Adequate nutrition will be maintained 02/12/2017 1829 - Progressing by Daylene Posey, RN Elimination: Will not experience complications related to bowel motility 02/12/2017 1829 - Progressing by Daylene Posey, RN Will not experience complications related to urinary retention 02/12/2017 1829 - Progressing by Daylene Posey, RN Safety: Ability to remain free from injury will improve 02/12/2017 1829 - Progressing by Daylene Posey, RN Skin Integrity: Risk for impaired skin integrity will decrease 02/12/2017 1829 - Progressing by Daylene Posey, RN

## 2017-02-12 NOTE — NC FL2 (Signed)
[place] LEVEL OF CARE SCREENING TOOL     IDENTIFICATION  Patient Name: Jimmy Riggs. Birthdate: 1925/02/23 Sex: male Admission Date (Current Location): 02/11/2017  [place] and [place] Number:  Engineering geologist and Address:  [facility], [address], [place], [place] [address]      Provider Number: [id number]  Attending Physician Name and Address:  Bettey Costa, MD  Relative Name and Phone Number:  Bradden Tadros (son) [phone]    Current Level of Care: Hospital Recommended Level of Care: [place], Memory Care Prior Approval Number:    Date Approved/Denied:   PASRR Number:    Discharge Plan: Other (Comment)(Memory Care Unit at ALF)    Current Diagnoses: Patient Active Problem List   Diagnosis Date Noted  . Acute respiratory failure ([place]) 02/11/2017  . Pressure injury of skin 02/09/2017  . Hematuria 02/07/2017  . Malnutrition of mild degree ([place]) 11/17/2016  . Vascular dementia without behavioral disturbance 11/10/2015  . Thrombocytopenia ([place])   . Preventative health care 09/29/2014  . Advance directive discussed with patient 09/29/2014  . Diabetes, polyneuropathy ([place]) 04/08/2013  . Osteoarthritis of left knee 06/09/2011  . Type 2 diabetes, controlled, with peripheral neuropathy ([place]) 07/28/2006  . Hyperlipemia 07/28/2006  . Essential hypertension, benign 07/28/2006  . Coronary atherosclerosis of native coronary artery 07/28/2006  . Atrial fibrillation ([place]) 07/28/2006  . BPH with obstruction/lower urinary tract symptoms 07/28/2006    Orientation RESPIRATION BLADDER Height & Weight     Self  O2(2L o2) Incontinent Weight: 141 lb 14.4 oz (64.4 kg) Height:  5\' 7"  (170.2 cm)  BEHAVIORAL SYMPTOMS/MOOD NEUROLOGICAL BOWEL NUTRITION STATUS      Incontinent Diet(Dysphagia 1)  AMBULATORY STATUS COMMUNICATION OF NEEDS Skin   Supervision Verbally Normal                       Personal Care  Assistance Level of Assistance  Bathing, Feeding, Dressing Bathing Assistance: Limited assistance Feeding assistance: Independent Dressing Assistance: Limited assistance     Functional Limitations Info             SPECIAL CARE FACTORS FREQUENCY                       Contractures Contractures Info: Not present    Additional Factors Info  Code Status, Allergies Code Status Info: DNR Allergies Info: No Known Allergies           Current Medications (02/12/2017):  This is the current hospital active medication list Current Facility-Administered Medications  Medication Dose Route Frequency Provider Last Rate Last Dose  . 0.9 %  sodium chloride infusion  250 mL Intravenous PRN Dustin Flock, MD      . acetaminophen (TYLENOL) tablet 650 mg  650 mg Oral Q6H PRN Dustin Flock, MD       Or  . acetaminophen (TYLENOL) suppository 650 mg  650 mg Rectal Q6H PRN Dustin Flock, MD      . albuterol (PROVENTIL) (2.5 MG/3ML) 0.083% nebulizer solution 2.5 mg  2.5 mg Nebulization Once Arta Silence, MD      . benzonatate (TESSALON) capsule 100 mg  100 mg Oral BID PRN Dustin Flock, MD      . diltiazem (CARDIZEM CD) 24 hr capsule 120 mg  120 mg Oral Daily Dustin Flock, MD   120 mg at 02/12/17 1049  . guaiFENesin (MUCINEX) 12 hr tablet 600 mg  600 mg Oral BID Dustin Flock,  MD   600 mg at 02/12/17 1048  . ipratropium-albuterol (DUONEB) 0.5-2.5 (3) MG/3ML nebulizer solution 3 mL  3 mL Nebulization Q6H Mody, Sital, MD   3 mL at 02/12/17 1358  . MEDLINE mouth rinse  15 mL Mouth Rinse BID Dustin Flock, MD   15 mL at 02/12/17 1101  . methylPREDNISolone sodium succinate (SOLU-MEDROL) 40 mg/mL injection 40 mg  40 mg Intravenous Q12H Bettey Costa, MD   40 mg at 02/12/17 1450  . ondansetron (ZOFRAN) tablet 4 mg  4 mg Oral Q6H PRN Dustin Flock, MD       Or  . ondansetron (ZOFRAN) injection 4 mg  4 mg Intravenous Q6H PRN Dustin Flock, MD      . piperacillin-tazobactam  (ZOSYN) IVPB 3.375 g  3.375 g Intravenous Boyce Medici, MD 12.5 mL/hr at 02/12/17 1756 3.375 g at 02/12/17 1756  . Rivaroxaban (XARELTO) tablet 15 mg  15 mg Oral Daily Dustin Flock, MD   15 mg at 02/12/17 1048  . rosuvastatin (CRESTOR) tablet 5 mg  5 mg Oral Mathis Fare, MD   5 mg at 02/12/17 1049  . sodium chloride 0.9 % bolus 1,000 mL  1,000 mL Intravenous Once Arta Silence, MD      . sodium chloride flush (NS) 0.9 % injection 3 mL  3 mL Intravenous Q12H Dustin Flock, MD   3 mL at 02/12/17 0257  . sodium chloride flush (NS) 0.9 % injection 3 mL  3 mL Intravenous PRN Dustin Flock, MD         Discharge Medications: Please see discharge summary for a list of discharge medications.  Relevant Imaging Results:  Relevant Lab Results:   Additional Information SS# [id number]  Zettie Pho, LCSW

## 2017-02-12 NOTE — Progress Notes (Addendum)
[facility] [place] NAME: Jimmy Riggs    MR#:  [id number]  DATE OF BIRTH:  [birth year]  SUBJECTIVE:  Patient is sleeping  REVIEW OF SYSTEMS:    Unable to obtain due to patient's sleeping. I tried to wake patient up but was not able to Nurse reports that he did not sleep well last night  Patient has history of dementia and is at a memory care unit  Tolerating Diet: yes      DRUG ALLERGIES:  No Known Allergies  VITALS:  Blood pressure (!) 116/59, pulse (!) 106, temperature 97.8 F (36.6 C), temperature source Oral, resp. rate 16, height 5\' 7"  (1.702 m), weight 64.4 kg (141 lb 14.4 oz), SpO2 97 %.  PHYSICAL EXAMINATION:  Constitutional: Appears well-developed and well-nourished. No distress. HENT: Normocephalic. .  Eyes: Conjunctivae and EOM are normal. PERRLA, no scleral icterus.  Neck: Normal ROM. Neck supple. No JVD. No tracheal deviation. CVS: Irregular, irregular S1/S2 +, no murmurs, no gallops, no carotid bruit.  Pulmonary: Effort and breath sounds normal, no stridor, rhonchi, wheezes, rales.  Abdominal: Soft. BS +,  no distension, tenderness, rebound or guarding.  Musculoskeletal:  No edema and no tenderness.  Neuro: Sleeping. Skin: No rashes noted . Psychiatric: Sleeping    LABORATORY PANEL:   CBC Recent Labs  Lab 02/12/17 0617  WBC 12.6*  HGB 9.0*  HCT 26.6*  PLT 78*   ------------------------------------------------------------------------------------------------------------------  Chemistries  Recent Labs  Lab 02/07/17 1019  02/12/17 0617  NA 123*   < > 129*  K 4.2   < > 3.0*  CL 87*   < > 98*  CO2 19*   < > 20*  GLUCOSE 169*   < > 175*  BUN 27*   < > 47*  CREATININE 1.51*   < > 1.47*  CALCIUM 9.2   < > 8.4*  MG  --   --  2.0  AST 54*  --   --   ALT 25  --   --   ALKPHOS 69  --   --   BILITOT 1.2  --   --    < > = values in this interval not displayed.    ------------------------------------------------------------------------------------------------------------------  Cardiac Enzymes Recent Labs  Lab 02/11/17 1754  TROPONINI 0.03*   ------------------------------------------------------------------------------------------------------------------  RADIOLOGY:  Dg Chest 1 View  Result Date: 02/12/2017 CLINICAL DATA:  Healthcare associated pneumonia EXAM: CHEST 1 VIEW COMPARISON:  02/11/2017 FINDINGS: Cardiomegaly. No confluent airspace opacities, effusions or edema. No acute bony abnormality. IMPRESSION: No active disease. Electronically Signed   By: Rolm Baptise M.D.   On: 02/12/2017 08:02   Ct Chest Wo Contrast  Addendum Date: 02/11/2017   ADDENDUM REPORT: 02/11/2017 21:54 ADDENDUM: Additional Ascending aortic aneurysm measuring up to 4.1 cm. Recommend annual imaging followup by CTA or MRA. This recommendation follows 2010 ACCF/AHA/AATS/ACR/ASA/SCA/SCAI/SIR/STS/SVM Guidelines for the Diagnosis and Management of Patients with Thoracic Aortic Disease. Circulation. 2010; 121: R443-X540 Electronically Signed   By: Donavan Foil M.D.   On: 02/11/2017 21:54   Result Date: 02/11/2017 CLINICAL DATA:  Acute respiratory illness, cough EXAM: CT CHEST WITHOUT CONTRAST TECHNIQUE: Multidetector CT imaging of the chest was performed following the standard protocol without IV contrast. COMPARISON:  Radiograph 02/11/2017 FINDINGS: Cardiovascular: Limited evaluation without intravenous contrast. Mild aneurysmal dilatation of ascending aorta measuring up to 4.1 cm. Moderate atherosclerosis. Coronary artery calcification. Mild cardiomegaly. Trace pericardial effusion. Mediastinum/Nodes: Midline trachea. Subcentimeter hypodense left lobe thyroid nodule and  possible hypodense nodule at the isthmus. Mildly prominent mediastinal lymph nodes, right upper paratracheal lymph node measures 1 cm, series 2, image number 46. Right precarinal lymph node measures 9 mm.  Esophagus within normal limits Lungs/Pleura: Small bilateral pleural effusions. Passive atelectasis within the posterior lung bases. Small foci of ground-glass density in the subpleural right upper lobe anteriorly and within the left upper lobe anteriorly, likely due to small foci of infection or inflammation. Mild emphysema. No pneumothorax. 13 x 11 mm, 12 mm average diameter left upper lobe medial pulmonary nodule, series 3, image number 60. Slightly spiculated margin. 6 mm left lower lobe pulmonary nodule, series 3, image number 102. 3 mm left lower lobe pulmonary nodule, series 3, image number 112. Upper Abdomen: Surgical clips at the gallbladder fossa. No acute abnormality in the upper abdomen Musculoskeletal: Degenerative changes. No acute or suspicious lesion. IMPRESSION: 1. There are small bilateral pleural effusions. Small nonspecific foci of ground-glass density in the upper lobes may reflect foci of infection or inflammation. No significant consolidative pneumonia is seen. 2. Multiple pulmonary nodules in the left upper and left lower lobe. Most concerning nodule is visible in the left upper lobe and measures 11 mm and demonstrates slightly spiculated margin. Consider one of the following in 3 months for both low-risk and high-risk individuals: (a) repeat chest CT, (b) follow-up PET-CT, or (c) tissue sampling. This recommendation follows the consensus statement: Guidelines for Management of Incidental Pulmonary Nodules Detected on CT Images: From the Fleischner Society 2017; Radiology 2017; 284:228-243. 3. Mild emphysema. 4. Mild mediastinal adenopathy, nonspecific. Aortic Atherosclerosis (ICD10-I70.0) and Emphysema (ICD10-J43.9). Electronically Signed: By: Donavan Foil M.D. On: 02/11/2017 21:27   Dg Chest Portable 1 View  Result Date: 02/11/2017 CLINICAL DATA:  Cough for 2 days. EXAM: PORTABLE CHEST 1 VIEW COMPARISON:  01/31/2017 chest radiograph FINDINGS: Cardiomegaly noted. Mild low volume film  with mild bibasilar atelectasis again noted. There is no evidence of focal airspace disease, pulmonary edema, suspicious pulmonary nodule/mass, pleural effusion, or pneumothorax. No acute bony abnormalities are identified. IMPRESSION: Cardiomegaly with mild bibasilar atelectasis Electronically Signed   By: Margarette Canada M.D.   On: 02/11/2017 18:34     ASSESSMENT AND PLAN:   83 year old male with history of dementia, diabetes, essential hypertension, chronic atrial fibrillation who presents with shortness of breath, cough and wheezing.Patient recently hospitalized for hematuria and UTI   1. Acute hypoxic respiratory failure: Clinical findings suggest pneumonia Wean oxygen as tolerated Incentive spirometer if patient unable to cooperate with this Wean steroids Continue nebs as needed   2. Sepsis due to HCAP: Patient presented with leukocytosis, tachycardia and tachypnea Sepsis is improving, lactic acid level is improving Continue IV fluids Continue zosyn Discontinue Vanc Follow-up on speech consultation.   3. Chronic atrial fibrillation: Continue xarelto and dilt  4. Hypokalemia: Replete and recheck in a.m.  5. Hyperlipidemia: Continue statin  6. Hyponatremia: Slowly improving  7. Acute kidney injury: This is improved  8. History of 2 cm right kidney cyst: Recommendations by urology during last hospitalists day was to have CT. This was deferred to PCP  9. Ascending aortic aneurysm measuring up to 4.1 cm. Recommend annual imaging followup by CTA or MRA  10. Multiple pulmonary nodules in the left upper and left lower lobe. Most concerning nodule is visible in the left upper lobe and measures 11 mm and demonstrates slightly spiculated margin. Consider one of the following in 3 months for both low-risk and high-risk Individuals   Management plans discussed with  nursing  CODE STATUS: DNR  TOTAL TIME TAKING CARE OF THIS PATIENT: 30 minutes.   He would benefit from  outpatient palliative care consultation at the time discharge  POSSIBLE D/C 2 days, DEPENDING ON CLINICAL CONDITION.   Mehreen Azizi M.D on 02/12/2017 at 10:13 AM  Between 7am to 6pm - Pager - [phone] After 6pm go to [email or web address] - password [email or web address] [facility]  Office  [phone]  CC: Primary care physician; Venia Carbon, MD  Note: This dictation was prepared with Dragon dictation along with smaller phrase technology. Any transcriptional errors that result from this process are unintentional.

## 2017-02-12 NOTE — Clinical Social Work Note (Signed)
Clinical Social Work Assessment  Patient Details  Name: Jimmy Riggs. MRN: [id number] Date of Birth: [birth year]  Date of referral:  02/12/17               Reason for consult:  Facility Placement                Permission sought to share information with:  Chartered certified accountant granted to share information::  Yes, Verbal Permission Granted  Name::        Agency::  [facility]  Relationship::     Contact Information:     Housing/Transportation Living arrangements for the past 2 months:  [place] of Information:  Patient, Medical Team, Adult Children Patient Interpreter Needed:  None Criminal Activity/Legal Involvement Pertinent to Current Situation/Hospitalization:  No - Comment as needed Significant Relationships:  Adult Children, [place], [employer] Lives with:  Facility Resident Do you feel safe going back to the place where you live?  Yes Need for family participation in patient care:  Yes (Comment)(Patient has dementia)  Care giving concerns:  Patient admitted from [facility]   Social Worker assessment / plan:  CSW met with the patient and his son at bedside to discuss discharge planning. The patient's son confirmed that the patient is a resident of [facility] and will return when stable.  CSW will continue to follow for discharge facilitation.  Employment status:  Retired Forensic scientist:  Medicare PT Recommendations:  Not assessed at this time Information / Referral to community resources:     Patient/Family's Response to care:  The patient's son thanked the CSW for assistance.   Patient/Family's Understanding of and Emotional Response to Diagnosis, Current Treatment, and Prognosis:  The patient's son is in agreement with the patient returning to the originating facility.  Emotional Assessment Appearance:  Appears stated age Attitude/Demeanor/Rapport:  (Pleasant, Alert, Confused) Affect  (typically observed):  Appropriate, Happy, Pleasant Orientation:  Oriented to Self Alcohol / Substance use:  Never Used Psych involvement (Current and /or in the community):  No (Comment)  Discharge Needs  Concerns to be addressed:  Care Coordination, Discharge Planning Concerns Readmission within the last 30 days:  No Current discharge risk:  Cognitively Impaired Barriers to Discharge:  Continued Medical Work up   [employer], LCSW 02/12/2017, 6:13 PM

## 2017-02-13 ENCOUNTER — Inpatient Hospital Stay: Payer: Medicare Other

## 2017-02-13 ENCOUNTER — Telehealth: Payer: Self-pay

## 2017-02-13 LAB — BASIC METABOLIC PANEL
ANION GAP: 11 (ref 5–15)
BUN: 55 mg/dL — ABNORMAL HIGH (ref 6–20)
CALCIUM: 8.7 mg/dL — AB (ref 8.9–10.3)
CO2: 21 mmol/L — AB (ref 22–32)
Chloride: 99 mmol/L — ABNORMAL LOW (ref 101–111)
Creatinine, Ser: 1.52 mg/dL — ABNORMAL HIGH (ref 0.61–1.24)
GFR calc Af Amer: 45 mL/min — ABNORMAL LOW (ref >60)
GFR calc non Af Amer: 39 mL/min — ABNORMAL LOW (ref >60)
GLUCOSE: 194 mg/dL — AB (ref 65–99)
POTASSIUM: 3.6 mmol/L (ref 3.5–5.1)
Sodium: 131 mmol/L — ABNORMAL LOW (ref 135–145)

## 2017-02-13 LAB — CBC
HEMATOCRIT: 25.3 % — AB (ref 40.0–52.0)
HEMOGLOBIN: 8.6 g/dL — AB (ref 13.0–18.0)
MCH: 30.6 pg (ref 26.0–34.0)
MCHC: 34.1 g/dL (ref 32.0–36.0)
MCV: 89.6 fL (ref 80.0–100.0)
Platelets: 105 10*3/uL — ABNORMAL LOW (ref 150–440)
RBC: 2.83 MIL/uL — ABNORMAL LOW (ref 4.40–5.90)
RDW: 15.3 % — ABNORMAL HIGH (ref 11.5–14.5)
WBC: 11.2 10*3/uL — ABNORMAL HIGH (ref 3.8–10.6)

## 2017-02-13 LAB — URINE CULTURE: Culture: NO GROWTH

## 2017-02-13 MED ORDER — PREDNISONE 50 MG PO TABS
50.0000 mg | ORAL_TABLET | Freq: Every day | ORAL | Status: DC
  Administered 2017-02-14 – 2017-02-15 (×2): 50 mg via ORAL
  Filled 2017-02-13 (×2): qty 1

## 2017-02-13 MED ORDER — IPRATROPIUM-ALBUTEROL 0.5-2.5 (3) MG/3ML IN SOLN
3.0000 mL | Freq: Three times a day (TID) | RESPIRATORY_TRACT | Status: DC
  Administered 2017-02-13 – 2017-02-15 (×7): 3 mL via RESPIRATORY_TRACT
  Filled 2017-02-13 (×8): qty 3

## 2017-02-13 MED ORDER — IPRATROPIUM-ALBUTEROL 0.5-2.5 (3) MG/3ML IN SOLN: 3.0000 mL | RESPIRATORY_TRACT | Status: DC | PRN

## 2017-02-13 NOTE — Evaluation (Addendum)
Objective Swallowing Evaluation: Type of Study: MBS-Modified Barium Swallow Study   Patient Details  Name: Jimmy Riggs. MRN: [id number] Date of Birth: [birth year]  Today's Date: 02/13/2017 Time: SLP Start Time (ACUTE ONLY): 0940 -SLP Stop Time (ACUTE ONLY): 1040  SLP Time Calculation (min) (ACUTE ONLY): 60 min   Past Medical History:  Past Medical History:  Diagnosis Date  . Acute intra-cranial hemorrhage (HCC)    w/ coumadin  . Atrial fibrillation ([place])   . Benign prostatic hypertrophy   . Coronary artery disease   . Hyperlipidemia   . Hypertension   . Inguinal hernia   . Lymphoma ([place])   . Melanoma in situ of shoulder ([place])   . NIDDM (non-insulin dependent diabetes mellitus)   . Osteoarthritis   . S/P tonsillectomy and adenoidectomy 1931  . Thrombocytopenia ([place])    Past Surgical History:  Past Surgical History:  Procedure Laterality Date  . CATARACT EXTRACTION    . CHOLECYSTECTOMY    . TONSILLECTOMY AND ADENOIDECTOMY     HPI: Pt is a 83 year old male with past medical history of Cognitive decline/dementia(per MD, chart), chronic atrial fibrillation, BPH, hypertension, mild cognitive decline, history of internal hemorrhage, thrombocytopenia, osteoarthritis, non-insulin-dependent diabetes who presented to the hospital due to hematuria previous admission; now he is readmitted w/ dx of pneumonia, sepsis, and hypoxia. According to his family members, he started having wheezing the second day after being admitted there he also started having cough which was new for him.  Patient also has been having on and off fevers since discharge.  Patient's got progressive shortness of breath and wheezing at the facility where he was staying therefore brought to the emergency room. Of note, pt was seen by ST services and recommended to be on a dysphagia diet of Dysphagia level 3(mech soft) and NECTAR consistency liquids d/t concern for dysphagia and aspiration risk.    Subjective: Pt was  awake; exhibited a hacking cough before pos administered; cooperative; appeared quite fatigued and min SOB w/ the exertion - on [place] O2 support    Assessment / Plan / Recommendation  CHL IP CLINICAL IMPRESSIONS 02/13/2017  Clinical Impression Pt appears to present w/ mild-moderate Pharyngeal phase dysphagia w/ increased risk for aspiration and the negative sequelae of such including pneumonia. During the pharyngeal phase, pt demonstrated delayed pharyngeal swallow initiation w/ reduced laryngeal excursion and pharyngeal pressure during the swallowing. Pt exhibited decreased epiglottic inversion and airway closure during the swallow. With trials of thin liquid consistency, pt exhibited consistent laryngeal penetration w/ intermittent deep penetration and contact to the Vocal Cords; no immediate aspiration occurred during this study but suspect pt is at high risk for such to occur. Laryngeal penetration was Silent w/ only an intermittent, delayed throat clearing - residue remained in the vestibule. Pt appeared to demonstrate a more timely pharyngeal swallow initiation w/ trials of Nectar consistency liquids and purees/soft solids w/ no laryngeal penetration or aspiration noted w/ trials. The min-mod pharyngeal residue remaining poses increased risk for laryngeal penetration and/or aspiration of the residue seeping into the laryngeal vestibule POST swallow; between swallows. Pt was encouraged to use a dry, f/u swallow as this can help reduce pharyngeal residue - pt appeared too weak to f/u w/ a second swallow consistently nor was able to use an effective effortful cough upon command. Suspect pt's baseline Cognitive decline and quick fatigue/weakness is impacting his swallowing. During the Oral phase, pt exhibited min decreased bolus cohesion w/ premature spillage of thin liquids into the pharynx; adequate  bolus management and mastication noted w/ increase textured trials of puree/soft solid w/ adequate oral  clearing. No overt Esopahgeal dysmotility noted in the cervical Esophagus.   SLP Visit Diagnosis Dysphagia, oropharyngeal phase (R13.12);Dysphagia, pharyngeal phase (R13.13)  Attention and concentration deficit following --  Frontal lobe and executive function deficit following --  Impact on safety and function Moderate aspiration risk      CHL IP TREATMENT RECOMMENDATION 02/13/2017  Treatment Recommendations Therapy as outlined in treatment plan below     Prognosis 02/13/2017  Prognosis for Safe Diet Advancement Fair  Barriers to Reach Goals Cognitive deficits  Barriers/Prognosis Comment --    CHL IP DIET RECOMMENDATION 02/13/2017  SLP Diet Recommendations Dysphagia 3 (Mech soft) solids;Nectar thick liquid  Liquid Administration via Cup  Medication Administration Whole meds with puree  Compensations Minimize environmental distractions;Slow rate;Small sips/bites;Lingual sweep for clearance of pocketing;Multiple dry swallows after each bite/sip;Follow solids with liquid  Postural Changes Remain semi-upright after after feeds/meals (Comment);Seated upright at 90 degrees      CHL IP OTHER RECOMMENDATIONS 02/13/2017  Recommended Consults (No Data)  Oral Care Recommendations Oral care BID;Staff/trained caregiver to provide oral care;Patient independent with oral care  Other Recommendations Order thickener from pharmacy;Prohibited food (jello, ice cream, thin soups);Remove water pitcher;Have oral suction available      CHL IP FOLLOW UP RECOMMENDATIONS 02/13/2017  Follow up Recommendations Skilled Nursing facility      [facility] IP FREQUENCY AND DURATION 02/13/2017  Speech Therapy Frequency (ACUTE ONLY) min 2x/week  Treatment Duration 1 week           CHL IP ORAL PHASE 02/13/2017  Oral Phase Impaired  Oral - Pudding Teaspoon --  Oral - Pudding Cup --  Oral - Honey Teaspoon --  Oral - Honey Cup --  Oral - Nectar Teaspoon --  Oral - Nectar Cup --  Oral - Nectar Straw --  Oral - Thin  Teaspoon --  Oral - Thin Cup --  Oral - Thin Straw --  Oral - Puree --  Oral - Mech Soft --  Oral - Regular --  Oral - Multi-Consistency --  Oral - Pill --  Oral Phase - Comment min decreased bolus cohesion w/ premature spillage of thin liquids into the pharynx; adequate bolus mastication of increased textures(soft)    CHL IP PHARYNGEAL PHASE 02/13/2017  Pharyngeal Phase Impaired  Pharyngeal- Pudding Teaspoon --  Pharyngeal --  Pharyngeal- Pudding Cup --  Pharyngeal --  Pharyngeal- Honey Teaspoon --  Pharyngeal --  Pharyngeal- Honey Cup --  Pharyngeal --  Pharyngeal- Nectar Teaspoon --  Pharyngeal --  Pharyngeal- Nectar Cup --  Pharyngeal --  Pharyngeal- Nectar Straw --  Pharyngeal --  Pharyngeal- Thin Teaspoon --  Pharyngeal --  Pharyngeal- Thin Cup --  Pharyngeal --  Pharyngeal- Thin Straw --  Pharyngeal --  Pharyngeal- Puree --  Pharyngeal --  Pharyngeal- Mechanical Soft --  Pharyngeal --  Pharyngeal- Regular --  Pharyngeal --  Pharyngeal- Multi-consistency --  Pharyngeal --  Pharyngeal- Pill --  Pharyngeal --  Pharyngeal Comment delayed pharyngeal swallow initiation w/ reduced laryngeal excursion and pharyngeal pressure during the swallowing. Pt exhibited decreased epiglottic inversion and airway closure during the swallow. With trials of thin liquid consistency, pt exhibited consistent laryngeal penetration w/ intermittent deep penetration and contact to the Vocal Cords; no immediate aspiration occurred during this study but suspect pt is at high risk for such to occur. Pt appeared to demonstrate a more timely pharyngeal swallow initiation w/ trials of  Nectar consistency liquids and purees/soft solids w/ no laryngeal penetration or aspiration noted w/ trials.      CHL IP CERVICAL ESOPHAGEAL PHASE 02/13/2017  Cervical Esophageal Phase WFL  Pudding Teaspoon --  Pudding Cup --  Honey Teaspoon --  Honey Cup --  Nectar Teaspoon --  Nectar Cup --  Nectar Straw --   Thin Teaspoon --  Thin Cup --  Thin Straw --  Puree --  Mechanical Soft --  Regular --  Multi-consistency --  Pill --  Cervical Esophageal Comment --    CHL IP GO 02/13/2017  Functional Assessment Tool Used clinical judgement  Functional Limitations Swallowing  Swallow Current Status (L3810) CJ  Swallow Goal Status (F7510) [place] Discharge Status (C5852) CJ  Motor Speech Current Status (D7824) (None)  Motor Speech Goal Status (M3536) (None)  Motor Speech Goal Status (R4431) (None)  Spoken Language Comprehension Current Status (V4008) (None)  Spoken Language Comprehension Goal Status (Q7619) (None)  Spoken Language Comprehension Discharge Status (J0932) (None)  Spoken Language Expression Current Status (I7124) (None)  Spoken Language Expression Goal Status (P8099) (None)  Spoken Language Expression Discharge Status (I3382) (None)  Attention Current Status (N0539) (None)  Attention Goal Status (J6734) (None)  Attention Discharge Status (L9379) (None)  Memory Current Status (K2409) (None)  Memory Goal Status (B3532) (None)  Memory Discharge Status (D9242) (None)  Voice Current Status (A8341) (None)  Voice Goal Status (D6222) (None)  Voice Discharge Status (L7989) (None)  Other Speech-Language Pathology Functional Limitation Current Status (Q1194) (None)  Other Speech-Language Pathology Functional Limitation Goal Status (R7408) (None)  Other Speech-Language Pathology Functional Limitation Discharge Status [phone]) (None)          Orinda Kenner, MS, CCC-SLP Watson,Katherine 02/13/2017, 4:38 PM

## 2017-02-13 NOTE — Telephone Encounter (Signed)
Per RBaity, pt and son have been spoken to regarding hospital f/u and TCM not required. Pt d/c to [facility]

## 2017-02-13 NOTE — Plan of Care (Signed)
  Progressing Education: Knowledge of General Education information will improve 02/13/2017 0240 - Progressing by Sonda Primes, RN Health Behavior/Discharge Planning: Ability to manage health-related needs will improve 02/13/2017 0240 - Progressing by Sonda Primes, RN Clinical Measurements: Ability to maintain clinical measurements within normal limits will improve 02/13/2017 0240 - Progressing by Sonda Primes, RN Will remain free from infection 02/13/2017 0240 - Progressing by Sonda Primes, RN Diagnostic test results will improve 02/13/2017 0240 - Progressing by Sonda Primes, RN Respiratory complications will improve 02/13/2017 0240 - Progressing by Sonda Primes, RN Cardiovascular complication will be avoided 02/13/2017 0240 - Progressing by Sonda Primes, RN Activity: Risk for activity intolerance will decrease 02/13/2017 0240 - Progressing by Sonda Primes, RN Nutrition: Adequate nutrition will be maintained 02/13/2017 0240 - Progressing by Sonda Primes, RN Elimination: Will not experience complications related to bowel motility 02/13/2017 0240 - Progressing by Sonda Primes, RN Will not experience complications related to urinary retention 02/13/2017 0240 - Progressing by Sonda Primes, RN Safety: Ability to remain free from injury will improve 02/13/2017 0240 - Progressing by Sonda Primes, RN Skin Integrity: Risk for impaired skin integrity will decrease 02/13/2017 0240 - Progressing by Sonda Primes, RN

## 2017-02-13 NOTE — Telephone Encounter (Signed)
Some SOB but no clear pneumonia Will follow status at [facility]

## 2017-02-13 NOTE — Progress Notes (Signed)
[facility] [place] NAME: Jimmy Riggs    MR#:  [id number]  DATE OF BIRTH:  1980-07-12  SUBJECTIVE:   Patient is awake and alert.  Occasional confusion.  REVIEW OF SYSTEMS:    Review of Systems  Constitutional: Positive for malaise/fatigue. Negative for chills and fever.  HENT: Negative for sore throat.   Eyes: Negative for blurred vision, double vision and pain.  Respiratory: Negative for cough, hemoptysis, shortness of breath and wheezing.   Cardiovascular: Negative for chest pain, palpitations, orthopnea and leg swelling.  Gastrointestinal: Negative for abdominal pain, constipation, diarrhea, heartburn, nausea and vomiting.  Genitourinary: Negative for dysuria and hematuria.  Musculoskeletal: Negative for back pain and joint pain.  Skin: Negative for rash.  Neurological: Negative for sensory change, speech change, focal weakness and headaches.  Endo/Heme/Allergies: Does not bruise/bleed easily.  Psychiatric/Behavioral: Negative for depression. The patient is not nervous/anxious.    DRUG ALLERGIES:  No Known Allergies  VITALS:  Blood pressure (!) 142/69, pulse 100, temperature 98 F (36.7 C), temperature source Oral, resp. rate 20, height 5\' 7"  (1.702 m), weight 64.4 kg (141 lb 14.4 oz), SpO2 98 %.  PHYSICAL EXAMINATION:  Constitutional: Appears well-developed and well-nourished. No distress. HENT: Normocephalic. .  Eyes: Conjunctivae and EOM are normal. PERRLA, no scleral icterus.  Neck: Normal ROM. Neck supple. No JVD. No tracheal deviation. CVS: Irregular, irregular S1/S2 +, no murmurs, no gallops, no carotid bruit.  Pulmonary: Bilateral wheezing.  Good air entry. Abdominal: Soft. BS +,  no distension, tenderness, rebound or guarding.  Musculoskeletal:  No edema and no tenderness.  Neuro: Moves all 4 extremities symmetrically Skin: No rash noticed Psychiatric: Awake and alert  LABORATORY PANEL:   CBC Recent Labs  Lab  02/13/17 0451  WBC 11.2*  HGB 8.6*  HCT 25.3*  PLT 105*   ------------------------------------------------------------------------------------------------------------------  Chemistries  Recent Labs  Lab 02/07/17 1019  02/12/17 0617 02/13/17 0451  NA 123*   < > 129* 131*  K 4.2   < > 3.0* 3.6  CL 87*   < > 98* 99*  CO2 19*   < > 20* 21*  GLUCOSE 169*   < > 175* 194*  BUN 27*   < > 47* 55*  CREATININE 1.51*   < > 1.47* 1.52*  CALCIUM 9.2   < > 8.4* 8.7*  MG  --   --  2.0  --   AST 54*  --   --   --   ALT 25  --   --   --   ALKPHOS 69  --   --   --   BILITOT 1.2  --   --   --    < > = values in this interval not displayed.   ------------------------------------------------------------------------------------------------------------------  Cardiac Enzymes Recent Labs  Lab 02/11/17 1754  TROPONINI 0.03*   ------------------------------------------------------------------------------------------------------------------  RADIOLOGY:  Dg Chest 1 View  Result Date: 02/12/2017 CLINICAL DATA:  Healthcare associated pneumonia EXAM: CHEST 1 VIEW COMPARISON:  02/11/2017 FINDINGS: Cardiomegaly. No confluent airspace opacities, effusions or edema. No acute bony abnormality. IMPRESSION: No active disease. Electronically Signed   By: Rolm Baptise M.D.   On: 02/12/2017 08:02   Ct Chest Wo Contrast  Addendum Date: 02/11/2017   ADDENDUM REPORT: 02/11/2017 21:54 ADDENDUM: Additional Ascending aortic aneurysm measuring up to 4.1 cm. Recommend annual imaging followup by CTA or MRA. This recommendation follows 2010 ACCF/AHA/AATS/ACR/ASA/SCA/SCAI/SIR/STS/SVM Guidelines for the Diagnosis and Management of Patients with Thoracic  Aortic Disease. Circulation. 2010; 121: B341-P379 Electronically Signed   By: Donavan Foil M.D.   On: 02/11/2017 21:54   Result Date: 02/11/2017 CLINICAL DATA:  Acute respiratory illness, cough EXAM: CT CHEST WITHOUT CONTRAST TECHNIQUE: Multidetector CT imaging of the  chest was performed following the standard protocol without IV contrast. COMPARISON:  Radiograph 02/11/2017 FINDINGS: Cardiovascular: Limited evaluation without intravenous contrast. Mild aneurysmal dilatation of ascending aorta measuring up to 4.1 cm. Moderate atherosclerosis. Coronary artery calcification. Mild cardiomegaly. Trace pericardial effusion. Mediastinum/Nodes: Midline trachea. Subcentimeter hypodense left lobe thyroid nodule and possible hypodense nodule at the isthmus. Mildly prominent mediastinal lymph nodes, right upper paratracheal lymph node measures 1 cm, series 2, image number 46. Right precarinal lymph node measures 9 mm. Esophagus within normal limits Lungs/Pleura: Small bilateral pleural effusions. Passive atelectasis within the posterior lung bases. Small foci of ground-glass density in the subpleural right upper lobe anteriorly and within the left upper lobe anteriorly, likely due to small foci of infection or inflammation. Mild emphysema. No pneumothorax. 13 x 11 mm, 12 mm average diameter left upper lobe medial pulmonary nodule, series 3, image number 60. Slightly spiculated margin. 6 mm left lower lobe pulmonary nodule, series 3, image number 102. 3 mm left lower lobe pulmonary nodule, series 3, image number 112. Upper Abdomen: Surgical clips at the gallbladder fossa. No acute abnormality in the upper abdomen Musculoskeletal: Degenerative changes. No acute or suspicious lesion. IMPRESSION: 1. There are small bilateral pleural effusions. Small nonspecific foci of ground-glass density in the upper lobes may reflect foci of infection or inflammation. No significant consolidative pneumonia is seen. 2. Multiple pulmonary nodules in the left upper and left lower lobe. Most concerning nodule is visible in the left upper lobe and measures 11 mm and demonstrates slightly spiculated margin. Consider one of the following in 3 months for both low-risk and high-risk individuals: (a) repeat chest CT,  (b) follow-up PET-CT, or (c) tissue sampling. This recommendation follows the consensus statement: Guidelines for Management of Incidental Pulmonary Nodules Detected on CT Images: From the Fleischner Society 2017; Radiology 2017; 284:228-243. 3. Mild emphysema. 4. Mild mediastinal adenopathy, nonspecific. Aortic Atherosclerosis (ICD10-I70.0) and Emphysema (ICD10-J43.9). Electronically Signed: By: Donavan Foil M.D. On: 02/11/2017 21:27   Dg Chest Portable 1 View  Result Date: 02/11/2017 CLINICAL DATA:  Cough for 2 days. EXAM: PORTABLE CHEST 1 VIEW COMPARISON:  01/31/2017 chest radiograph FINDINGS: Cardiomegaly noted. Mild low volume film with mild bibasilar atelectasis again noted. There is no evidence of focal airspace disease, pulmonary edema, suspicious pulmonary nodule/mass, pleural effusion, or pneumothorax. No acute bony abnormalities are identified. IMPRESSION: Cardiomegaly with mild bibasilar atelectasis Electronically Signed   By: Margarette Canada M.D.   On: 02/11/2017 18:34     ASSESSMENT AND PLAN:   84 year old male with history of dementia, diabetes, essential hypertension, chronic atrial fibrillation who presents with shortness of breath, cough and wheezing.Patient recently hospitalized for hematuria and UTI  *Bilateral upper lobe pneumonia likely aspiration pneumonia with sepsis present on admission Acute hypoxic respiratory failure has resolved. IV Zosyn.  Cultures pending.  Add prednisone.  Scheduled nebulizers.  *Dysphagia.  Had barium swallow.  On dysphagia diet.  * Chronic atrial fibrillation On Xarelto and diltiazem  *Hypokalemia.  Replaced.  * Hyperlipidemia: Continue statin  *CKD stage III is stable  * History of 2 cm right kidney cyst: Recommendations by urology during last admission was to have CT as outpatient  * Ascending aortic aneurysm measuring up to 4.1 cm. Recommend annual imaging followup  by CTA or MRA  * Multiple pulmonary nodules in the left upper and left  lower lobe. Most concerning nodule is visible in the left upper lobe and measures 11 mm and demonstrates slightly spiculated margin. Consider one of the following in 3 months for both low-risk and high-risk Individuals  Discussed with daughter-in-law at bedside.  CODE STATUS: DNR  TOTAL TIME TAKING CARE OF THIS PATIENT: 30 minutes.   Needs palliative care following as outpatient.  Likely discharge tomorrow   Neita Carp M.D on 02/13/2017 at 1:48 PM  Between 7am to 6pm - Pager - [phone] After 6pm go to [email or web address] - password [email or web address] [facility]  Office  [phone]  CC: Primary care physician; Venia Carbon, MD  Note: This dictation was prepared with Dragon dictation along with smaller phrase technology. Any transcriptional errors that result from this process are unintentional.

## 2017-02-13 NOTE — Telephone Encounter (Signed)
Spoke to pts son and advised

## 2017-02-13 NOTE — Telephone Encounter (Signed)
Patient Name: Jimmy Riggs Gender: Male DOB: 1925/01/12 Age: 83 Y 9 M 18 D Return Phone Number: [phone] (Primary), [phone] (Secondary) Address: City/State/Zip: [place] Night - Client Client Site [place] Physician Viviana Simpler - MD Contact Type Call Who Is Calling Patient / Member / Family / Caregiver Call Type Triage / Clinical Caller Name Helene Kelp Relationship To Patient Care Giver Return Phone Number [phone] (Secondary) Chief Complaint BREATHING - fast, heavy or wheezing Reason for Call Symptomatic / Request for [place] states that she is calling from [facility] care she has a pt that was recently released from the ER and is now having respiratory issues. Respiratory rate is 28 Translation No Nurse Assessment Nurse: Cherre Robins, RN, Ria Comment Date/Time (Eastern Time): 02/11/2017 4:37:16 PM Confirm and document reason for call. If symptomatic, describe symptoms. ---Caller states that she is and RN calling from [facility] care she has a pt that was released from the Hospital on the 29th after being admitted for a few days with a UTI and has a choking episode while eating and is now having respiratory issues. Respiratory rate is 28. Caller states that he is working harder to breathe- caller states she put him on oxygen and pt says it's not helping- o2 sats are 99 on oxygen. Caller states lung sounds sound reduce. Does the patient have any new or worsening symptoms? ---Yes Will a triage be completed? ---Yes Related visit to physician within the last 2 weeks? ---Yes Does the PT have any chronic conditions? (i.e. diabetes, asthma, etc.) ---Yes List chronic conditions. ---vascular dementia, persistent atrila fib, Type 2 DM, benign prostatic hyperplasia, other obstructive and reflux uropathy, esential HTN, and  hyperlipidemia/. Is this a behavioral health or substance abuse call? ---No Guidelines Guideline Title Affirmed Question Affirmed Notes Nurse Date/Time (Eastern Time) Breathing Difficulty [1] MODERATE difficulty breathing (e.g., speaks in phrases, Weiss-Hilton, RN, Ria Comment 02/11/2017 4:41:02 PM PLEASE NOTE: All timestamps contained within this report are represented as [place] Standard Time. CONFIDENTIALTY NOTICE: This fax transmission is intended only for the addressee. It contains information that is legally privileged, confidential or otherwise protected from use or disclosure. If you are not the intended recipient, you are strictly prohibited from reviewing, disclosing, copying using or disseminating any of this information or taking any action in reliance on or regarding this information. If you have received this fax in error, please notify us immediately by telephone so that we can arrange for its return to [place]. Phone: [phone], Toll-Free: [phone], Fax: [phone] Page: 2 of 2 Call Id: [id number] Guidelines Guideline Title Affirmed Question Affirmed Notes Nurse Date/Time Eilene Ghazi Time) SOB even at rest, pulse 100-120) AND [2] NEWonset or WORSE than normal Disp. Time Eilene Ghazi Time) Disposition Final User 02/11/2017 4:35:27 PM Send to Urgent Zorita Pang 02/11/2017 4:43:56 PM Go to ED Now Yes Weiss-Hilton, RN, Ivonne Andrew Disagree/Comply Comply Caller Understands Yes PreDisposition InappropriateToAsk Care Advice Given Per Guideline GO TO ED NOW: You need to be seen in the Emergency Department. Go to the ER at ___________ [place] now. Drive carefully. NOTE TO TRIAGER - DRIVING: * Another adult should drive. BRING MEDICINES: * Please bring a list of your current medicines when you go to the Emergency Department (ER). CALL EMS [phone] IF: you become worse. CARE ADVICE given per Breathing Difficulty (Adult) guideline. Referrals [facility] - ED

## 2017-02-13 NOTE — Telephone Encounter (Signed)
Patient has been admitted to [facility].

## 2017-02-13 NOTE — Care Management Important Message (Signed)
Important Message  Patient Details  Name: Jimmy Riggs. MRN: [id number] Date of Birth: 1924/12/27   Medicare Important Message Given:  Yes    Shelbie Ammons, RN 02/13/2017, 7:22 AM

## 2017-02-13 NOTE — Clinical Social Work Note (Signed)
CSW spoke to [facility] and they can accept patient back once he is medically ready for discharge and orders have been received.  Jones Broom. [place], MSW, [place]  02/13/2017 5:07 PM

## 2017-02-14 MED ORDER — FUROSEMIDE 10 MG/ML IJ SOLN
20.0000 mg | Freq: Once | INTRAMUSCULAR | Status: AC
  Administered 2017-02-14: 20 mg via INTRAVENOUS
  Filled 2017-02-14: qty 2

## 2017-02-14 NOTE — Progress Notes (Signed)
Physical Therapy Treatment Patient Details Name: Jimmy Riggs. MRN: [id number] DOB: 1924/03/22 Today's Date: 02/14/2017    History of Present Illness Tc Kapusta  is a 83 y.o. male with a known history of atrial fibrillation, coronary artery disease, hyperlipidemia, essential hypertension, non-insulin-dependent diabetes who was actually hospitalized and discharged 2 days ago after he was admitted with hematuria associated with a urinary tract infection.  Patient also at that time was noted to have hyponatremia.  During that hospitalization he received IV fluids.  Patient was discharged on oral antibiotics.  According to his family members he started having wheezing the second day after being admitted he also started having cough which was new for him.  Patient also has been having on and off fevers since discharge.  Patient's got progressive shortness of breath and wheezing at the facility where he was staying    PT Comments    To edge of bed with min assist.  Stood with walker and min assist.  During attempts at stepping, pt required mod assist due to balance and LE weakness/bucking.  Pt returned to sitting and stand pivot transfer without walker was used to get pt to chair safely.  Participated in exercises as described below.   Follow Up Recommendations  SNF     Equipment Recommendations       Recommendations for Other Services       Precautions / Restrictions Precautions Precautions: Fall Restrictions Weight Bearing Restrictions: No    Mobility  Bed Mobility Overal bed mobility: Needs Assistance Bed Mobility: Supine to Sit     Supine to sit: Min assist        Transfers Overall transfer level: Needs assistance Equipment used: Rolling walker (2 wheeled);None Transfers: Sit to/from Stand Sit to Stand: Min assist            Ambulation/Gait Ambulation/Gait assistance: Min assist Ambulation Distance (Feet): 2 Feet Assistive device: None Gait  Pattern/deviations: Step-to pattern         Stairs            Wheelchair Mobility    Modified Rankin (Stroke Patients Only)       Balance Overall balance assessment: Needs assistance Sitting-balance support: Bilateral upper extremity supported Sitting balance-Leahy Scale: Fair Sitting balance - Comments: flexed posture with verbal cues to sit upright   Standing balance support: Bilateral upper extremity supported Standing balance-Leahy Scale: Fair Standing balance comment: flexed posture continues in standing, corrects somewhat with verbal cues.                            Cognition Arousal/Alertness: Awake/alert Behavior During Therapy: WFL for tasks assessed/performed Overall Cognitive Status: Within Functional Limits for tasks assessed                                        Exercises Other Exercises Other Exercises: seated LAQ and marches x 10 before pt fatigue    General Comments        Pertinent Vitals/Pain Pain Assessment: No/denies pain    Home Living                      Prior Function            PT Goals (current goals can now be found in the care plan section) Progress towards PT goals: Progressing toward goals  Frequency    Min 2X/week      PT Plan Current plan remains appropriate    Co-evaluation              AM-PAC PT "6 Clicks" Daily Activity  Outcome Measure  Difficulty turning over in bed (including adjusting bedclothes, sheets and blankets)?: Unable Difficulty moving from lying on back to sitting on the side of the bed? : Unable Difficulty sitting down on and standing up from a chair with arms (e.g., wheelchair, bedside commode, etc,.)?: Unable Help needed moving to and from a bed to chair (including a wheelchair)?: A Lot Help needed walking in hospital room?: A Lot Help needed climbing 3-5 steps with a railing? : Total 6 Click Score: 8    End of Session Equipment Utilized  During Treatment: Gait belt Activity Tolerance: Patient tolerated treatment well Patient left: in chair;with chair alarm set;with call bell/phone within reach         Time: 1130-1146 PT Time Calculation (min) (ACUTE ONLY): 16 min  Charges:  $Therapeutic Activity: 8-22 mins                    G Codes:     Chesley Noon, PTA 03/09/17, 11:57 AM

## 2017-02-14 NOTE — Plan of Care (Signed)
  Progressing Education: Knowledge of General Education information will improve 02/14/2017 1150 - Progressing by Daylene Posey, RN Health Behavior/Discharge Planning: Ability to manage health-related needs will improve 02/14/2017 1150 - Progressing by Daylene Posey, RN Clinical Measurements: Ability to maintain clinical measurements within normal limits will improve 02/14/2017 1150 - Progressing by Daylene Posey, RN Will remain free from infection 02/14/2017 1150 - Progressing by Daylene Posey, RN Diagnostic test results will improve 02/14/2017 1150 - Progressing by Daylene Posey, RN Respiratory complications will improve 02/14/2017 1150 - Progressing by Daylene Posey, RN Cardiovascular complication will be avoided 02/14/2017 1150 - Progressing by Daylene Posey, RN Activity: Risk for activity intolerance will decrease 02/14/2017 1150 - Progressing by Daylene Posey, RN Nutrition: Adequate nutrition will be maintained 02/14/2017 1150 - Progressing by Daylene Posey, RN Elimination: Will not experience complications related to bowel motility 02/14/2017 1150 - Progressing by Daylene Posey, RN Will not experience complications related to urinary retention 02/14/2017 1150 - Progressing by Daylene Posey, RN Safety: Ability to remain free from injury will improve 02/14/2017 1150 - Progressing by Daylene Posey, RN Skin Integrity: Risk for impaired skin integrity will decrease 02/14/2017 1150 - Progressing by Daylene Posey, RN

## 2017-02-14 NOTE — Progress Notes (Signed)
[facility] [place] NAME: Jimmy Riggs    MR#:  [id number]  DATE OF BIRTH:  20-Jul-1924  SUBJECTIVE:   Patient is awake and alert.  Occasional confusion. Not on O2  REVIEW OF SYSTEMS:    Review of Systems  Constitutional: Positive for malaise/fatigue. Negative for chills and fever.  HENT: Negative for sore throat.   Eyes: Negative for blurred vision, double vision and pain.  Respiratory: Negative for cough, hemoptysis, shortness of breath and wheezing.   Cardiovascular: Negative for chest pain, palpitations, orthopnea and leg swelling.  Gastrointestinal: Negative for abdominal pain, constipation, diarrhea, heartburn, nausea and vomiting.  Genitourinary: Negative for dysuria and hematuria.  Musculoskeletal: Negative for back pain and joint pain.  Skin: Negative for rash.  Neurological: Negative for sensory change, speech change, focal weakness and headaches.  Endo/Heme/Allergies: Does not bruise/bleed easily.  Psychiatric/Behavioral: Negative for depression. The patient is not nervous/anxious.    DRUG ALLERGIES:  No Known Allergies  VITALS:  Blood pressure (!) 138/91, pulse (!) 102, temperature 98.1 F (36.7 C), temperature source Oral, resp. rate 16, height 5\' 7"  (1.702 m), weight 64.4 kg (141 lb 14.4 oz), SpO2 93 %.  PHYSICAL EXAMINATION:  Constitutional: Appears well-developed and well-nourished. No distress. HENT: Normocephalic. .  Eyes: Conjunctivae and EOM are normal. PERRLA, no scleral icterus.  Neck: Normal ROM. Neck supple. No JVD. No tracheal deviation. CVS: Irregular, irregular S1/S2 +, no murmurs, no gallops, no carotid bruit.  Pulmonary: Bilateral wheezing.  Good air entry. Abdominal: Soft. BS +,  no distension, tenderness, rebound or guarding.  Musculoskeletal:  No edema and no tenderness.  Neuro: Moves all 4 extremities symmetrically Skin: No rash noticed Psychiatric: Awake and alert  LABORATORY PANEL:    CBC Recent Labs  Lab 02/13/17 0451  WBC 11.2*  HGB 8.6*  HCT 25.3*  PLT 105*   ------------------------------------------------------------------------------------------------------------------  Chemistries  Recent Labs  Lab 02/07/17 1019  02/12/17 0617 02/13/17 0451  NA 123*   < > 129* 131*  K 4.2   < > 3.0* 3.6  CL 87*   < > 98* 99*  CO2 19*   < > 20* 21*  GLUCOSE 169*   < > 175* 194*  BUN 27*   < > 47* 55*  CREATININE 1.51*   < > 1.47* 1.52*  CALCIUM 9.2   < > 8.4* 8.7*  MG  --   --  2.0  --   AST 54*  --   --   --   ALT 25  --   --   --   ALKPHOS 69  --   --   --   BILITOT 1.2  --   --   --    < > = values in this interval not displayed.   ------------------------------------------------------------------------------------------------------------------  Cardiac Enzymes Recent Labs  Lab 02/11/17 1754  TROPONINI 0.03*   ------------------------------------------------------------------------------------------------------------------  RADIOLOGY:  No results found.   ASSESSMENT AND PLAN:   83 year old male with history of dementia, diabetes, essential hypertension, chronic atrial fibrillation who presents with shortness of breath, cough and wheezing.Patient recently hospitalized for hematuria and UTI  * Bilateral upper lobe pneumonia secondary likely to aspiration pneumonia with sepsis present on admission Acute hypoxic respiratory failure has resolved. IV Zosyn.  Cultures pending.  Added prednisone yesterday.  Scheduled nebulizers.  *Dysphagia.  Had barium swallow.  On dysphagia diet.  * Chronic atrial fibrillation On Xarelto and diltiazem  *Hypokalemia.  Replaced.  * Hyperlipidemia:  Continue statin  *CKD stage III is stable  * History of 2 cm right kidney cyst: Recommendations by urology during last admission was to have CT as outpatient  * Ascending aortic aneurysm measuring up to 4.1 cm. Recommend annual imaging followup by CTA or MRA  *  Multiple pulmonary nodules in the left upper and left lower lobe. Most concerning nodule is visible in the left upper lobe and measures 11 mm and demonstrates slightly spiculated margin. Consider one of the following in 3 months for both low-risk and high-risk Individuals  Discussed with son.  CODE STATUS: DNR  TOTAL TIME TAKING CARE OF THIS PATIENT: 30 minutes.   Needs palliative care following as outpatient.  Likely d/c in AM back to memory care unit   Neita Carp M.D on 02/14/2017 at 10:07 AM  Between 7am to 6pm - Pager - [phone]  After 6pm go to [email or web address] - password [email or web address] [facility]  Office  [phone]  CC: Primary care physician; Venia Carbon, MD  Note: This dictation was prepared with Dragon dictation along with smaller phrase technology. Any transcriptional errors that result from this process are unintentional.

## 2017-02-15 MED ORDER — AMOXICILLIN-POT CLAVULANATE 400-57 MG/5ML PO SUSR: 800.0000 mg | Freq: Two times a day (BID) | ORAL | 0 refills | Status: AC

## 2017-02-15 MED ORDER — POLYETHYLENE GLYCOL 3350 17 G PO PACK
17.0000 g | PACK | Freq: Every day | ORAL | Status: DC
  Administered 2017-02-15: 11:00:00 17 g via ORAL
  Filled 2017-02-15: qty 1

## 2017-02-15 MED ORDER — DOCUSATE SODIUM 100 MG PO CAPS: 100.0000 mg | ORAL_CAPSULE | Freq: Every day | ORAL | 0 refills | Status: DC

## 2017-02-15 MED ORDER — PREDNISONE 20 MG PO TABS: 50.0000 mg | ORAL_TABLET | Freq: Every day | ORAL | 0 refills | Status: AC

## 2017-02-15 MED ORDER — DILTIAZEM HCL ER COATED BEADS 180 MG PO CP24
180.0000 mg | ORAL_CAPSULE | Freq: Every day | ORAL | Status: DC
  Administered 2017-02-15: 180 mg via ORAL
  Filled 2017-02-15: qty 1

## 2017-02-15 NOTE — Plan of Care (Signed)
  Education: Knowledge of General Education information will improve 02/15/2017 0409 - Not Progressing by Jeffie Pollock, RN   Health Behavior/Discharge Planning: Ability to manage health-related needs will improve 02/15/2017 0409 - Progressing by Jeffie Pollock, RN   Clinical Measurements: Ability to maintain clinical measurements within normal limits will improve 02/15/2017 0409 - Progressing by Jeffie Pollock, RN Will remain free from infection 02/15/2017 0409 - Progressing by Jeffie Pollock, RN Diagnostic test results will improve 02/15/2017 0409 - Progressing by Jeffie Pollock, RN Respiratory complications will improve 02/15/2017 0409 - Progressing by Jeffie Pollock, RN Cardiovascular complication will be avoided 02/15/2017 0409 - Progressing by Jeffie Pollock, RN   Activity: Risk for activity intolerance will decrease 02/15/2017 0409 - Progressing by Jeffie Pollock, RN   Nutrition: Adequate nutrition will be maintained 02/15/2017 0409 - Progressing by Jeffie Pollock, RN   Elimination: Will not experience complications related to bowel motility 02/15/2017 0409 - Progressing by Jeffie Pollock, RN Will not experience complications related to urinary retention 02/15/2017 0409 - Progressing by Jeffie Pollock, RN   Safety: Ability to remain free from injury will improve 02/15/2017 0409 - Progressing by Jeffie Pollock, RN   Skin Integrity: Risk for impaired skin integrity will decrease 02/15/2017 0409 - Progressing by Jeffie Pollock, RN

## 2017-02-15 NOTE — NC FL2 (Signed)
[place] LEVEL OF CARE SCREENING TOOL     IDENTIFICATION  Patient Name: Jimmy Riggs. Birthdate: [birth year] Sex: male Admission Date (Current Location): 02/11/2017  [place] and [place] Number:  Engineering geologist and Address:  [facility], [address], [place], [place] [address]      Provider Number: [id number]  Attending Physician Name and Address:  Hillary Bow, MD  Relative Name and Phone Number:  Jeriel Vivanco (son) [phone]    Current Level of Care: Hospital Recommended Level of Care: [place], Memory Care Prior Approval Number:    Date Approved/Denied:   PASRR Number:    Discharge Plan: Other (Comment)(Memory Care Unit at ALF)    Current Diagnoses: Primary: Vascular Dementia Patient Active Problem List   Diagnosis Date Noted  . Acute respiratory failure ([place]) 02/11/2017  . Pressure injury of skin 02/09/2017  . Hematuria 02/07/2017  . Malnutrition of mild degree ([place]) 11/17/2016  . Vascular dementia without behavioral disturbance 11/10/2015  . Thrombocytopenia ([place])   . Preventative health care 09/29/2014  . Advance directive discussed with patient 09/29/2014  . Diabetes, polyneuropathy ([place]) 04/08/2013  . Osteoarthritis of left knee 06/09/2011  . Type 2 diabetes, controlled, with peripheral neuropathy ([place]) 07/28/2006  . Hyperlipemia 07/28/2006  . Essential hypertension, benign 07/28/2006  . Coronary atherosclerosis of native coronary artery 07/28/2006  . Atrial fibrillation ([place]) 07/28/2006  . BPH with obstruction/lower urinary tract symptoms 07/28/2006    Orientation RESPIRATION BLADDER Height & Weight     Self  Room Air  Incontinent Weight: 141 lb 14.4 oz (64.4 kg) Height:  5\' 7"  (170.2 cm)  BEHAVIORAL SYMPTOMS/MOOD NEUROLOGICAL BOWEL NUTRITION STATUS      Incontinent Dysphagia 3 (Mech soft) solids;Nectar thick liquid  AMBULATORY STATUS COMMUNICATION OF NEEDS Skin   Extensive  Assistance Verbally Normal                       Personal Care Assistance Level of Assistance  Bathing, Feeding, Dressing Bathing Assistance: Limited assistance Feeding assistance: Independent Dressing Assistance: Limited assistance     Functional Limitations Info             SPECIAL CARE FACTORS FREQUENCY   PT   3-4 days per week                  Contractures Contractures Info: Not present    Additional Factors Info  Code Status, Allergies Code Status Info: DNR Allergies Info: No Known Allergies          Discharge Medications: Please see discharge summary for a list of discharge medications. Medication List     TAKE these medications   acetaminophen 325 MG tablet Commonly known as:  TYLENOL Take 650 mg by mouth every 4 (four) hours as needed.   amoxicillin-clavulanate 400-57 MG/5ML suspension Commonly known as:  AUGMENTIN Take 10 mLs (800 mg total) by mouth 2 (two) times daily for 8 days. What changed:  when to take this   benzonatate 100 MG capsule Commonly known as:  TESSALON Take 1 capsule (100 mg total) by mouth 2 (two) times daily as needed for cough.   diltiazem 120 MG 24 hr capsule Commonly known as:  CARDIZEM CD TAKE 1 CAPSULE BY MOUTH  DAILY   docusate sodium 100 MG capsule Commonly known as:  COLACE Take 1 capsule (100 mg total) by mouth daily.   glucose blood test strip Commonly known as:  ONE TOUCH ULTRA TEST  Use as instructed to test blood sugar once daily dx: E11.40   multivitamin tablet Take 1 tablet by mouth daily.   onetouch ultrasoft lancets Use as instructed to test blood sugar once daily dx: E11.49   predniSONE 20 MG tablet Commonly known as:  DELTASONE Take 2.5 tablets (50 mg total) by mouth daily with breakfast for 3 days.   rosuvastatin 5 MG tablet Commonly known as:  CRESTOR TAKE 1 TABLET BY MOUTH  EVERY OTHER DAY   triamcinolone cream 0.1 % Commonly known as:  KENALOG apply to affected area  twice a day if needed   XARELTO 20 MG Tabs tablet Generic drug:  rivaroxaban TAKE 1 TABLET BY MOUTH ONCE DAILY WITH SUPPER     Relevant Imaging Results: Relevant Lab Results: Additional Information SS# [id number]  Jimmy Riggs, Jimmy Riggs, Jimmy Riggs

## 2017-02-15 NOTE — Progress Notes (Signed)
Pt for discharge back to [facility] memory care unit. No resp distress. Sl d/cd.  tol meds well . No voiced c/o. Ems called to transport  Pt. At 1200 noon. Report  Called to susan morris at [facility].  Waiting for  Ems to pick pt up for transport

## 2017-02-15 NOTE — Discharge Instructions (Signed)
Dysphagia 2 diet with nectar thickened liquids

## 2017-02-15 NOTE — Plan of Care (Signed)
  Progressing Education: Knowledge of General Education information will improve 02/15/2017 1139 - Progressing by Rowe Robert, RN Health Behavior/Discharge Planning: Ability to manage health-related needs will improve 02/15/2017 1139 - Progressing by Rowe Robert, RN Clinical Measurements: Ability to maintain clinical measurements within normal limits will improve 02/15/2017 1139 - Progressing by Rowe Robert, RN Will remain free from infection 02/15/2017 1139 - Progressing by Rowe Robert, RN Diagnostic test results will improve 02/15/2017 1139 - Progressing by Rowe Robert, RN Respiratory complications will improve 02/15/2017 1139 - Progressing by Rowe Robert, RN Cardiovascular complication will be avoided 02/15/2017 1139 - Progressing by Rowe Robert, RN Activity: Risk for activity intolerance will decrease 02/15/2017 1139 - Progressing by Rowe Robert, RN Nutrition: Adequate nutrition will be maintained 02/15/2017 1139 - Progressing by Rowe Robert, RN Elimination: Will not experience complications related to bowel motility 02/15/2017 1139 - Progressing by Rowe Robert, RN Will not experience complications related to urinary retention 02/15/2017 1139 - Progressing by Rowe Robert, RN Safety: Ability to remain free from injury will improve 02/15/2017 1139 - Progressing by Rowe Robert, RN Skin Integrity: Risk for impaired skin integrity will decrease 02/15/2017 1139 - Progressing by Rowe Robert, RN

## 2017-02-15 NOTE — Discharge Summary (Addendum)
[facility] NAME: Jimmy Riggs    MR#:  [id number]  DATE OF BIRTH:  08-10-1924  DATE OF ADMISSION:  02/11/2017 ADMITTING PHYSICIAN: Dustin Flock, MD  DATE OF DISCHARGE: 02/15/2017  PRIMARY CARE PHYSICIAN: Venia Carbon, MD   ADMISSION DIAGNOSIS:  Respiratory distress [R06.03] Sepsis, due to unspecified organism ([place]) [A41.9]  DISCHARGE DIAGNOSIS:  Active Problems:   Acute respiratory failure ([place])   SECONDARY DIAGNOSIS:   Past Medical History:  Diagnosis Date  . Acute intra-cranial hemorrhage (HCC)    w/ coumadin  . Atrial fibrillation ([place])   . Benign prostatic hypertrophy   . Coronary artery disease   . Hyperlipidemia   . Hypertension   . Inguinal hernia   . Lymphoma ([place])   . Melanoma in situ of shoulder ([place])   . NIDDM (non-insulin dependent diabetes mellitus)   . Osteoarthritis   . S/P tonsillectomy and adenoidectomy 1931  . Thrombocytopenia ([place])      ADMITTING HISTORY  HISTORY OF PRESENT ILLNESS: Jimmy Riggs  is a 83 y.o. male with a known history of atrial fibrillation, coronary artery disease, hyperlipidemia, essential hypertension, non-insulin-dependent diabetes who was actually hospitalized and discharged 2 days ago after he was admitted with hematuria associated with a urinary tract infection.  Patient also at that time was noted to have hyponatremia.  During that hospitalization he received IV fluids.  Patient was discharged on oral antibiotics.  According to his family members he started having wheezing the second day after being admitted he also started having cough which was new for him.  Patient also has been having on and off fevers since discharge.  Patient's got progressive shortness of breath and wheezing at the facility where he was staying.  Therefore brought to the emergency room.  Patient is a poor historian and his son was able to provide all the history.  HOSPITAL COURSE:   83 year old  male with history of dementia, diabetes, essential hypertension, chronic atrial fibrillation who presents with shortness of breath, cough and wheezing.Patient recently hospitalized for hematuria and UTI  * Bilateral upper lobe pneumonia secondary to aspiration pneumonia with sepsis present on admission Acute hypoxic respiratory failure has resolved. IV Zosyn --> change to PO augmentin at discharge.  Cultures Negative.  Added prednisone and will continue after discharge  *Dysphagia.  Had barium swallow.  On dysphagia 3 diet with nectar thickened liquids  * Chronic atrial fibrillation On Xarelto and diltiazem  *Hypokalemia.  Replaced.  * Hyperlipidemia: Continue statin  *CKD stage III is stable  * History of 2 cm right kidney cyst: Recommendations by urology during last admission was to have CT as outpatient with PCP  * Ascending aortic aneurysm measuring up to 4.1 cm. Recommend annual imaging followup by CTA or MRA  * Multiple pulmonary nodules in the left upper and left lower lobe. Most concerning nodule is visible in the left upper lobe and measures 11 mm and demonstrates slightly spiculated margin. Consider Repeat CT chest in 3 months  Stable for discharge back to his facility  CONSULTS OBTAINED:    DRUG ALLERGIES:  No Known Allergies  DISCHARGE MEDICATIONS:   Allergies as of 02/15/2017   No Known Allergies     Medication List    TAKE these medications   acetaminophen 325 MG tablet Commonly known as:  TYLENOL Take 650 mg by mouth every 4 (four) hours as needed.   amoxicillin-clavulanate 400-57 MG/5ML suspension Commonly known as:  AUGMENTIN Take  10 mLs (800 mg total) by mouth 2 (two) times daily for 8 days. What changed:  when to take this   benzonatate 100 MG capsule Commonly known as:  TESSALON Take 1 capsule (100 mg total) by mouth 2 (two) times daily as needed for cough.   diltiazem 120 MG 24 hr capsule Commonly known as:  CARDIZEM CD TAKE 1  CAPSULE BY MOUTH  DAILY   glucose blood test strip Commonly known as:  ONE TOUCH ULTRA TEST Use as instructed to test blood sugar once daily dx: E11.40   multivitamin tablet Take 1 tablet by mouth daily.   onetouch ultrasoft lancets Use as instructed to test blood sugar once daily dx: E11.49   predniSONE 20 MG tablet Commonly known as:  DELTASONE Take 2.5 tablets (50 mg total) by mouth daily with breakfast for 3 days.   rosuvastatin 5 MG tablet Commonly known as:  CRESTOR TAKE 1 TABLET BY MOUTH  EVERY OTHER DAY   triamcinolone cream 0.1 % Commonly known as:  KENALOG apply to affected area twice a day if needed   XARELTO 20 MG Tabs tablet Generic drug:  rivaroxaban TAKE 1 TABLET BY MOUTH ONCE DAILY WITH SUPPER       Today   VITAL SIGNS:  Blood pressure 124/69, pulse (!) 115, temperature 97.8 F (36.6 C), temperature source Oral, resp. rate 16, height 5\' 7"  (1.702 m), weight 64.4 kg (141 lb 14.4 oz), SpO2 96 %.  I/O:    Intake/Output Summary (Last 24 hours) at 02/15/2017 0949 Last data filed at 02/14/2017 1700 Gross per 24 hour  Intake 290 ml  Output -  Net 290 ml    PHYSICAL EXAMINATION:  Physical Exam  GENERAL:  83 y.o.-year-old patient lying in the bed with no acute distress.  LUNGS: Normal breath sounds bilaterally, no wheezing, rales,rhonchi or crepitation. No use of accessory muscles of respiration.  CARDIOVASCULAR: S1, S2 normal. No murmurs, rubs, or gallops.  ABDOMEN: Soft, non-tender, non-distended. Bowel sounds present. No organomegaly or mass.  NEUROLOGIC: Moves all 4 extremities. PSYCHIATRIC: The patient is alert and awake. SKIN: No obvious rash, lesion, or ulcer.   DATA REVIEW:   CBC Recent Labs  Lab 02/13/17 0451  WBC 11.2*  HGB 8.6*  HCT 25.3*  PLT 105*    Chemistries  Recent Labs  Lab 02/12/17 0617 02/13/17 0451  NA 129* 131*  K 3.0* 3.6  CL 98* 99*  CO2 20* 21*  GLUCOSE 175* 194*  BUN 47* 55*  CREATININE 1.47* 1.52*   CALCIUM 8.4* 8.7*  MG 2.0  --     Cardiac Enzymes Recent Labs  Lab 02/11/17 1754  TROPONINI 0.03*    Microbiology Results  Results for orders placed or performed during the hospital encounter of 02/11/17  Culture, blood (routine x 2)     Status: None (Preliminary result)   Collection Time: 02/11/17  5:54 PM  Result Value Ref Range Status   Specimen Description BLOOD BLOOD LEFT HAND  Final   Special Requests   Final    BOTTLES DRAWN AEROBIC AND ANAEROBIC Blood Culture results may not be optimal due to an inadequate volume of blood received in culture bottles   Culture NO GROWTH 4 DAYS  Final   Report Status PENDING  Incomplete  Culture, blood (routine x 2)     Status: None (Preliminary result)   Collection Time: 02/11/17  7:20 PM  Result Value Ref Range Status   Specimen Description BLOOD RIGHT ANTECUBITAL  Final  Special Requests   Final    BOTTLES DRAWN AEROBIC AND ANAEROBIC Blood Culture adequate volume   Culture NO GROWTH 4 DAYS  Final   Report Status PENDING  Incomplete  Urine culture     Status: None   Collection Time: 02/12/17  5:56 AM  Result Value Ref Range Status   Specimen Description URINE, RANDOM  Final   Special Requests NONE  Final   Culture   Final    NO GROWTH Performed at [facility], [place] [address] [place], [place] [address]    Report Status 02/13/2017 FINAL  Final    RADIOLOGY:  No results found.  Follow up with PCP in 1 week.  Management plans discussed with the patient, family and they are in agreement.  CODE STATUS:     Code Status Orders  (From admission, onward)        Start     Ordered   02/11/17 2302  Do not attempt resuscitation (DNR)  Continuous    Question Answer Comment  In the event of cardiac or respiratory ARREST Do not call a "code blue"   In the event of cardiac or respiratory ARREST Do not perform Intubation, CPR, defibrillation or ACLS   In the event of cardiac or respiratory ARREST Use medication by any  route, position, wound care, and other measures to relive pain and suffering. May use oxygen, suction and manual treatment of airway obstruction as needed for comfort.      02/11/17 2301    Code Status History    Date Active Date Inactive Code Status Order ID Comments User Context   02/11/2017 20:53 02/11/2017 23:01 DNR [id number]  Dustin Flock, MD ED   02/07/2017 15:34 02/09/2017 19:49 DNR [id number]  Henreitta Leber, MD Inpatient    Advance Directive Documentation     Most Recent Value  Type of Advance Directive  Out of facility DNR (pink MOST or yellow form), Healthcare Power of Attorney, Living will  Pre-existing out of facility DNR order (yellow form or pink MOST form)  Yellow form placed in chart (order not valid for inpatient use)  "MOST" Form in Place?  No data      TOTAL TIME TAKING CARE OF THIS PATIENT ON DAY OF DISCHARGE: more than 30 minutes.   Jimmy Riggs M.D on 02/15/2017 at 9:49 AM  Between 7am to 6pm - Pager - [phone]  After 6pm go to [email or web address] - password [email or web address] [facility]  Office  [phone]  CC: Primary care physician; Venia Carbon, MD  Note: This dictation was prepared with Dragon dictation along with smaller phrase technology. Any transcriptional errors that result from this process are unintentional.

## 2017-02-15 NOTE — Progress Notes (Signed)
Patient is medically stable for D/C to [facility] today. Per [facility] admissions coordinator for [facility] patient can come today. RN will call report and arrange EMS for transport. Clinical Education officer, museum (CSW) sent D/C orders to [facility] via [place]. CSW contacted patient's son Eddie Dibbles and made him aware of above. Please reconsult if future social work needs arise. CSW signing off.   [employer], LCSW [phone]

## 2017-02-15 NOTE — Progress Notes (Signed)
Ems here to transport pt back to [facility] no c/o.

## 2017-02-16 ENCOUNTER — Telehealth: Payer: Self-pay

## 2017-02-16 ENCOUNTER — Ambulatory Visit: Payer: Medicare Other | Admitting: Internal Medicine

## 2017-02-16 ENCOUNTER — Other Ambulatory Visit: Payer: Self-pay

## 2017-02-16 DIAGNOSIS — J690 Pneumonitis due to inhalation of food and vomit: Secondary | ICD-10-CM

## 2017-02-16 DIAGNOSIS — R2681 Unsteadiness on feet: Secondary | ICD-10-CM | POA: Diagnosis not present

## 2017-02-16 DIAGNOSIS — M6281 Muscle weakness (generalized): Secondary | ICD-10-CM | POA: Diagnosis not present

## 2017-02-16 LAB — CULTURE, BLOOD (ROUTINE X 2)
CULTURE: NO GROWTH
Culture: NO GROWTH
SPECIAL REQUESTS: ADEQUATE

## 2017-02-16 NOTE — Patient Outreach (Signed)
[facility]) Care Management  02/16/2017  Quention Mcneill. [birth year] [id number]  EMMI: General discharge Referral date: 02/16/17 Referral source: EMMI general discharge red alert Referral reason:Scheduled follow up appointment Day # 4  Telephone call to patient regarding EMMI general discharge red alert. Spoke with patients son and daughter in law, Eddie Dibbles and Kinnie Feil, designated party release.  Son states patient is currently residing back at [facility] assisted living/ memory care unit due to patient having cognitive issues. Son states patient does not have a follow up appointment with his primary MD at this time. RNCM advised patient be scheduled for a post hospital follow up appointment with his primary MD. Son verbalized understanding and agreement.  Son states he received a call from [facility] stating patient had an upcoming appointment for catheter removal.  Son states patients catheter was removed while he was in the hospital.  Son states he cancelled appointment with [facility]. Son states patient was in the hospital two days prior to this recent admission for a urinary tract infection. Son states patient was discharged from the hospital then readmitted 48 hours later for this current hospitalization.   Daughter in law states she was present with patient during his hospitalizations.  Son and daughter in law states [place] assist patient with the administration of his medications.   RNCM advised family to notify MD of any changes in condition prior to scheduled appointment. RNCM verified family aware of 911 services for urgent/ emergent needs. RNCM advised son and daughter in law they can contact the hospitals patient experience department if they have any concerns regarding patients hospital stay. Son and daughter in law states patient has no further needs at this time.  Daughter in law request EMMI general discharge calls be discontinued.   PLAN:  RNCM will refer patient to care management assistant to close patient due to patient being assessed and having no further needs per patients son/ daughter in law.  Quinn Plowman RN,BSN,CCM [facility] Telephonic  [phone]

## 2017-02-16 NOTE — Telephone Encounter (Signed)
Called and got son. They have decided to hold off on the urology appt unless he has another episode.of hematuria. Also discussed the pulmonary nodule---- I recommended that we not follow up with more CT scans either

## 2017-02-16 NOTE — Telephone Encounter (Signed)
Copied from [place] [id number]. Topic: Inquiry >> Feb 16, 2017  1:50 PM Arletha Grippe wrote: Reason for CVU:DTHYH [place] called - please call back on cell phone [phone] She appreciated your message earlier.  She would like to get more information about possible urology referral.

## 2017-02-16 NOTE — Telephone Encounter (Signed)
Spoke to [place]. She said she knows the cystoscope is very uncomfortable and not sure he would understand what was going on. Understands we are looking at the bladder. If something is found, then what? Is looking for something looking for trouble? Just not sure putting him through the procedure is worth it. He saw Dr Junious Silk at [facility] and they were not happy with him. Does not want to see him, again.

## 2017-02-16 NOTE — Telephone Encounter (Signed)
Please let her know that Manuela Schwartz the nurse is calling to schedule the follow up visit with the urologist that was planned after the hospital stay. I think this would be a good idea but we could reconsider if you don't want him to go (ie-- hold off unless he has recurrent bleeding episode). Generally an office cystoscopy would be appropriate to make sure we aren't missing a worrisome spot in the bladder as the cause of the bleeding

## 2017-02-17 ENCOUNTER — Other Ambulatory Visit: Payer: Self-pay

## 2017-02-17 DIAGNOSIS — M6281 Muscle weakness (generalized): Secondary | ICD-10-CM | POA: Diagnosis not present

## 2017-02-17 DIAGNOSIS — R2681 Unsteadiness on feet: Secondary | ICD-10-CM | POA: Diagnosis not present

## 2017-02-20 DIAGNOSIS — M6281 Muscle weakness (generalized): Secondary | ICD-10-CM | POA: Diagnosis not present

## 2017-02-20 DIAGNOSIS — R2681 Unsteadiness on feet: Secondary | ICD-10-CM | POA: Diagnosis not present

## 2017-02-21 DIAGNOSIS — R2681 Unsteadiness on feet: Secondary | ICD-10-CM | POA: Diagnosis not present

## 2017-02-21 DIAGNOSIS — M6281 Muscle weakness (generalized): Secondary | ICD-10-CM | POA: Diagnosis not present

## 2017-02-22 DIAGNOSIS — M6281 Muscle weakness (generalized): Secondary | ICD-10-CM | POA: Diagnosis not present

## 2017-02-22 DIAGNOSIS — R2681 Unsteadiness on feet: Secondary | ICD-10-CM | POA: Diagnosis not present

## 2017-02-23 DIAGNOSIS — R2681 Unsteadiness on feet: Secondary | ICD-10-CM | POA: Diagnosis not present

## 2017-02-23 DIAGNOSIS — M6281 Muscle weakness (generalized): Secondary | ICD-10-CM | POA: Diagnosis not present

## 2017-02-24 DIAGNOSIS — R2681 Unsteadiness on feet: Secondary | ICD-10-CM | POA: Diagnosis not present

## 2017-02-24 DIAGNOSIS — M6281 Muscle weakness (generalized): Secondary | ICD-10-CM | POA: Diagnosis not present

## 2017-02-27 ENCOUNTER — Ambulatory Visit: Payer: Medicare Other | Admitting: Podiatry

## 2017-02-27 DIAGNOSIS — M6281 Muscle weakness (generalized): Secondary | ICD-10-CM | POA: Diagnosis not present

## 2017-02-27 DIAGNOSIS — R2681 Unsteadiness on feet: Secondary | ICD-10-CM | POA: Diagnosis not present

## 2017-02-28 DIAGNOSIS — R2681 Unsteadiness on feet: Secondary | ICD-10-CM | POA: Diagnosis not present

## 2017-02-28 DIAGNOSIS — M6281 Muscle weakness (generalized): Secondary | ICD-10-CM | POA: Diagnosis not present

## 2017-03-01 DIAGNOSIS — M6281 Muscle weakness (generalized): Secondary | ICD-10-CM | POA: Diagnosis not present

## 2017-03-01 DIAGNOSIS — R2681 Unsteadiness on feet: Secondary | ICD-10-CM | POA: Diagnosis not present

## 2017-03-02 DIAGNOSIS — R2681 Unsteadiness on feet: Secondary | ICD-10-CM | POA: Diagnosis not present

## 2017-03-02 DIAGNOSIS — M6281 Muscle weakness (generalized): Secondary | ICD-10-CM | POA: Diagnosis not present

## 2017-04-13 DIAGNOSIS — F0150 Vascular dementia without behavioral disturbance: Secondary | ICD-10-CM | POA: Diagnosis not present

## 2017-04-13 DIAGNOSIS — R262 Difficulty in walking, not elsewhere classified: Secondary | ICD-10-CM | POA: Diagnosis not present

## 2017-04-17 DIAGNOSIS — R262 Difficulty in walking, not elsewhere classified: Secondary | ICD-10-CM | POA: Diagnosis not present

## 2017-04-19 DIAGNOSIS — I4891 Unspecified atrial fibrillation: Secondary | ICD-10-CM | POA: Diagnosis not present

## 2017-04-19 DIAGNOSIS — E43 Unspecified severe protein-calorie malnutrition: Secondary | ICD-10-CM | POA: Diagnosis not present

## 2017-04-19 DIAGNOSIS — E785 Hyperlipidemia, unspecified: Secondary | ICD-10-CM | POA: Diagnosis not present

## 2017-04-19 DIAGNOSIS — R112 Nausea with vomiting, unspecified: Secondary | ICD-10-CM | POA: Diagnosis not present

## 2017-04-19 DIAGNOSIS — E119 Type 2 diabetes mellitus without complications: Secondary | ICD-10-CM | POA: Diagnosis not present

## 2017-04-19 DIAGNOSIS — N401 Enlarged prostate with lower urinary tract symptoms: Secondary | ICD-10-CM | POA: Diagnosis not present

## 2017-04-19 DIAGNOSIS — F0150 Vascular dementia without behavioral disturbance: Secondary | ICD-10-CM | POA: Diagnosis not present

## 2017-04-19 DIAGNOSIS — R1110 Vomiting, unspecified: Secondary | ICD-10-CM | POA: Diagnosis not present

## 2017-04-19 DIAGNOSIS — E860 Dehydration: Secondary | ICD-10-CM | POA: Diagnosis not present

## 2017-04-20 ENCOUNTER — Other Ambulatory Visit: Payer: Self-pay

## 2017-04-21 ENCOUNTER — Encounter (INDEPENDENT_AMBULATORY_CARE_PROVIDER_SITE_OTHER): Payer: Self-pay

## 2017-04-21 ENCOUNTER — Ambulatory Visit (INDEPENDENT_AMBULATORY_CARE_PROVIDER_SITE_OTHER): Payer: Medicare Other | Admitting: Gastroenterology

## 2017-04-21 ENCOUNTER — Inpatient Hospital Stay
Admission: EM | Admit: 2017-04-21 | Discharge: 2017-05-12 | DRG: 640 | Disposition: E | Payer: Medicare Other | Attending: Internal Medicine | Admitting: Internal Medicine

## 2017-04-21 ENCOUNTER — Other Ambulatory Visit: Payer: Self-pay

## 2017-04-21 ENCOUNTER — Encounter: Payer: Self-pay | Admitting: Gastroenterology

## 2017-04-21 VITALS — BP 91/60 | HR 76 | Temp 97.4°F | Wt 116.4 lb

## 2017-04-21 DIAGNOSIS — R1111 Vomiting without nausea: Secondary | ICD-10-CM

## 2017-04-21 DIAGNOSIS — N179 Acute kidney failure, unspecified: Secondary | ICD-10-CM | POA: Diagnosis present

## 2017-04-21 DIAGNOSIS — Z515 Encounter for palliative care: Secondary | ICD-10-CM | POA: Diagnosis not present

## 2017-04-21 DIAGNOSIS — R1110 Vomiting, unspecified: Secondary | ICD-10-CM

## 2017-04-21 DIAGNOSIS — Z66 Do not resuscitate: Secondary | ICD-10-CM | POA: Diagnosis present

## 2017-04-21 DIAGNOSIS — E1122 Type 2 diabetes mellitus with diabetic chronic kidney disease: Secondary | ICD-10-CM | POA: Diagnosis present

## 2017-04-21 DIAGNOSIS — E870 Hyperosmolality and hypernatremia: Secondary | ICD-10-CM | POA: Diagnosis present

## 2017-04-21 DIAGNOSIS — E860 Dehydration: Principal | ICD-10-CM | POA: Diagnosis present

## 2017-04-21 DIAGNOSIS — G92 Toxic encephalopathy: Secondary | ICD-10-CM | POA: Diagnosis present

## 2017-04-21 DIAGNOSIS — I129 Hypertensive chronic kidney disease with stage 1 through stage 4 chronic kidney disease, or unspecified chronic kidney disease: Secondary | ICD-10-CM | POA: Diagnosis present

## 2017-04-21 DIAGNOSIS — R627 Adult failure to thrive: Secondary | ICD-10-CM | POA: Diagnosis present

## 2017-04-21 DIAGNOSIS — E785 Hyperlipidemia, unspecified: Secondary | ICD-10-CM | POA: Diagnosis present

## 2017-04-21 DIAGNOSIS — R230 Cyanosis: Secondary | ICD-10-CM | POA: Diagnosis not present

## 2017-04-21 DIAGNOSIS — R000 Tachycardia, unspecified: Secondary | ICD-10-CM | POA: Diagnosis present

## 2017-04-21 DIAGNOSIS — I480 Paroxysmal atrial fibrillation: Secondary | ICD-10-CM | POA: Diagnosis present

## 2017-04-21 DIAGNOSIS — I2510 Atherosclerotic heart disease of native coronary artery without angina pectoris: Secondary | ICD-10-CM | POA: Diagnosis present

## 2017-04-21 DIAGNOSIS — N183 Chronic kidney disease, stage 3 (moderate): Secondary | ICD-10-CM | POA: Diagnosis present

## 2017-04-21 DIAGNOSIS — N289 Disorder of kidney and ureter, unspecified: Secondary | ICD-10-CM

## 2017-04-21 DIAGNOSIS — I959 Hypotension, unspecified: Secondary | ICD-10-CM

## 2017-04-21 DIAGNOSIS — F0280 Dementia in other diseases classified elsewhere without behavioral disturbance: Secondary | ICD-10-CM | POA: Diagnosis present

## 2017-04-21 DIAGNOSIS — N189 Chronic kidney disease, unspecified: Secondary | ICD-10-CM

## 2017-04-21 DIAGNOSIS — Z833 Family history of diabetes mellitus: Secondary | ICD-10-CM

## 2017-04-21 DIAGNOSIS — Z87891 Personal history of nicotine dependence: Secondary | ICD-10-CM

## 2017-04-21 DIAGNOSIS — F0150 Vascular dementia without behavioral disturbance: Secondary | ICD-10-CM | POA: Diagnosis present

## 2017-04-21 LAB — COMPREHENSIVE METABOLIC PANEL
ALBUMIN: 3.7 g/dL (ref 3.5–5.0)
ALT: 19 U/L (ref 17–63)
AST: 26 U/L (ref 15–41)
Alkaline Phosphatase: 94 U/L (ref 38–126)
Anion gap: 14 (ref 5–15)
BILIRUBIN TOTAL: 0.9 mg/dL (ref 0.3–1.2)
BUN: 49 mg/dL — AB (ref 6–20)
CHLORIDE: 126 mmol/L — AB (ref 101–111)
CO2: 22 mmol/L (ref 22–32)
CREATININE: 1.83 mg/dL — AB (ref 0.61–1.24)
Calcium: 9.3 mg/dL (ref 8.9–10.3)
GFR calc Af Amer: 36 mL/min — ABNORMAL LOW (ref >60)
GFR, EST NON AFRICAN AMERICAN: 31 mL/min — AB (ref >60)
GLUCOSE: 116 mg/dL — AB (ref 65–99)
Potassium: 3.6 mmol/L (ref 3.5–5.1)
Sodium: 162 mmol/L (ref 135–145)
Total Protein: 7.8 g/dL (ref 6.5–8.1)

## 2017-04-21 LAB — CBC
HEMATOCRIT: 37.9 % — AB (ref 40.0–52.0)
Hemoglobin: 11.9 g/dL — ABNORMAL LOW (ref 13.0–18.0)
MCH: 31.1 pg (ref 26.0–34.0)
MCHC: 31.5 g/dL — ABNORMAL LOW (ref 32.0–36.0)
MCV: 99 fL (ref 80.0–100.0)
PLATELETS: 173 10*3/uL (ref 150–440)
RBC: 3.83 MIL/uL — AB (ref 4.40–5.90)
RDW: 16.5 % — ABNORMAL HIGH (ref 11.5–14.5)
WBC: 6.9 10*3/uL (ref 3.8–10.6)

## 2017-04-21 LAB — MAGNESIUM: Magnesium: 2.5 mg/dL — ABNORMAL HIGH (ref 1.7–2.4)

## 2017-04-21 LAB — TROPONIN I: Troponin I: <0.03 ng/mL (ref <0.03)

## 2017-04-21 MED ORDER — ONDANSETRON HCL 4 MG/2ML IJ SOLN
4.0000 mg | Freq: Once | INTRAMUSCULAR | Status: AC
  Administered 2017-04-21: 4 mg via INTRAVENOUS

## 2017-04-21 MED ORDER — SODIUM CHLORIDE 0.9 % IV BOLUS (SEPSIS)
1000.0000 mL | Freq: Once | INTRAVENOUS | Status: AC
  Administered 2017-04-21: 1000 mL via INTRAVENOUS

## 2017-04-21 MED ORDER — SODIUM CHLORIDE 0.9 % IV BOLUS (SEPSIS)
1000.0000 mL | Freq: Once | INTRAVENOUS | Status: DC
  Administered 2017-04-21: 1000 mL via INTRAVENOUS

## 2017-04-21 NOTE — ED Provider Notes (Signed)
[facility] Emergency Department Provider Note  ____________________________________________  Time seen: Approximately 10:04 PM  I have reviewed the triage vital signs and the nursing notes.   HISTORY  Chief Complaint Vascular Access Problem    HPI Jimmy Riggs. is a 84 y.o. male w/ a hx of failure to thrive presenting due to dehydration.  Several weeks ago, the patient developed an inability to keep down any food or drinks, with associated vomiting.  An intravenous catheter was placed and the patient was hydrated with fluids in his nursing facility.  In addition, his diet was changed to a pured and liquid diet.  The symptoms have continued to worsen.  There was some concern that he may have a fungal infection in his gastrointestinal tract due to broad-spectrum antibiotics that were used during a hospitalization in December for sepsis.  Today, the patient was seen in the gastrointestinal clinic, with a plan for hydration, and possible imaging in the future.  An IV could not be established in his nursing home, EMS was called and they were also unable to establish an IV so the patient was transported here.  At this time, the patient denies any pain.  Past Medical History:  Diagnosis Date  . Acute intra-cranial hemorrhage (HCC)    w/ coumadin  . Atrial fibrillation ([place])   . Benign prostatic hypertrophy   . Coronary artery disease   . Hyperlipidemia   . Hypertension   . Inguinal hernia   . Lymphoma ([place])   . Melanoma in situ of shoulder ([place])   . NIDDM (non-insulin dependent diabetes mellitus)   . Osteoarthritis   . S/P tonsillectomy and adenoidectomy 1931  . Thrombocytopenia [facility])     Patient Active Problem List   Diagnosis Date Noted  . Acute respiratory failure ([place]) 02/11/2017  . Pressure injury of skin 02/09/2017  . Hematuria 02/07/2017  . Malnutrition of mild degree ([place]) 11/17/2016  . Vascular dementia without behavioral disturbance 11/10/2015   . Thrombocytopenia ([place])   . Preventative health care 09/29/2014  . Advance directive discussed with patient 09/29/2014  . Diabetes, polyneuropathy ([place]) 04/08/2013  . ED (erectile dysfunction) of organic origin 01/02/2013  . Elevated prostate specific antigen (PSA) 01/02/2013  . Incomplete emptying of bladder 01/02/2013  . Increased frequency of urination 01/02/2013  . Nodular prostate with urinary obstruction 01/02/2013  . Osteoarthritis of left knee 06/09/2011  . Type 2 diabetes, controlled, with peripheral neuropathy ([place]) 07/28/2006  . Hyperlipemia 07/28/2006  . Essential hypertension, benign 07/28/2006  . Coronary atherosclerosis of native coronary artery 07/28/2006  . Atrial fibrillation ([place]) 07/28/2006  . BPH with obstruction/lower urinary tract symptoms 07/28/2006    Past Surgical History:  Procedure Laterality Date  . CATARACT EXTRACTION    . CHOLECYSTECTOMY    . TONSILLECTOMY AND ADENOIDECTOMY      Current Outpatient Rx  . Order #: [id number] Class: Historical Med  . Order #: [id number] Class: Historical Med  . Order #: [id number] Class: Historical Med  . Order #: [id number] Class: Normal  . Order #: [id number] Class: Normal  . Order #: [id number] Class: Normal  . Order #: [id number] Class: Historical Med  . Order #: [id number] Class: Historical Med  . Order #: [id number] Class: Historical Med  . Order #: [id number] Class: Historical Med  . Order #: [id number] Class: Historical Med  . Order #: [id number] Class: Historical Med  . Order #: [id number] Class: Normal  . Order #: [id number] Class: Normal    Allergies Patient has no known allergies.  Family History  Problem Relation Age of Onset  .  Heart failure Mother   . Diabetes Mother   . Hypertension Mother   . Prostate cancer Father   . Arthritis Sister     Social History Social History   Tobacco Use  . Smoking status: Former Smoker    Packs/day: 0.00    Years: 20.00    Pack years: 0.00    Last attempt to quit: 03/14/1980     Years since quitting: 37.1  . Smokeless tobacco: Never Used  Substance Use Topics  . Alcohol use: Yes    Comment: socially  . Drug use: No    Review of Systems Constitutional: No fever/chills.  + FTT Eyes: No visual changes. ENT: No sore throat. No congestion or rhinorrhea. Cardiovascular: Denies chest pain. Denies palpitations. Respiratory: Denies shortness of breath.  No cough. Gastrointestinal: No abdominal pain.  +nausea, +vomiting.  No diarrhea.  No constipation. Genitourinary: Negative for dysuria. Musculoskeletal: Negative for back pain. Skin: Negative for rash. Neurological: Negative for headaches. No focal numbness, tingling or weakness.     ____________________________________________   PHYSICAL EXAM:  VITAL SIGNS: ED Triage Vitals  Enc Vitals Group     BP 04/30/2017 1951 104/71     Pulse Rate 04/25/2017 1951 78     Resp 04/20/2017 1951 19     Temp 04/18/2017 1951 98.2 F (36.8 C)     Temp Source 04/17/2017 1951 Oral     SpO2 04/25/2017 1951 100 %     Weight 05/01/2017 1954 116 lb (52.6 kg)     Height --      Head Circumference --      Peak Flow --      Pain Score --      Pain Loc --      Pain Edu? --      Excl. in [place]? --     Constitutional: Patient is alert, makes good eye contact, but is chronically ill.. Eyes: Conjunctivae are normal.  EOMI. No scleral icterus. Head: Atraumatic. Nose: No congestion/rhinnorhea. Mouth/Throat: Mucous membranes are very dry.  Neck: No stridor.  Supple.  No JVD.  No meningismus. Cardiovascular: Normal rate, regular rhythm. No murmurs, rubs or gallops.  On my examination, the patient is hypotensive to 91/72. Respiratory: Normal respiratory effort.  No accessory muscle use or retractions. Lungs CTAB.  No wheezes, rales or ronchi. Gastrointestinal: Soft, nontender and nondistended.  No guarding or rebound.  No peritoneal signs. Musculoskeletal: No LE edema. No ttp in the calves or palpable cords.  Negative Homan's sign. Neurologic:  alert and protecting his airway..  Face and smile are symmetric.  EOMI.  Moves all extremities well. Skin:  Skin is warm, dry and intact. No rash noted. Psychiatric: Mood and affect are normal.  ____________________________________________   LABS (all labs ordered are listed, but only abnormal results are displayed)  Labs Reviewed  CBC - Abnormal; Notable for the following components:      Result Value   RBC 3.83 (*)    Hemoglobin 11.9 (*)    HCT 37.9 (*)    MCHC 31.5 (*)    RDW 16.5 (*)    All other components within normal limits  COMPREHENSIVE METABOLIC PANEL - Abnormal; Notable for the following components:   Sodium 162 (*)    Chloride 126 (*)    Glucose, Bld 116 (*)    BUN 49 (*)    Creatinine, Ser 1.83 (*)    GFR calc non Af Amer 31 (*)    GFR calc Af Amer 36 (*)  All other components within normal limits  MAGNESIUM - Abnormal; Notable for the following components:   Magnesium 2.5 (*)    All other components within normal limits  TROPONIN I  URINALYSIS, COMPLETE (UACMP) WITH MICROSCOPIC   ____________________________________________  EKG  Not performed during my shift.  ____________________________________________  RADIOLOGY  No results found.  ____________________________________________   PROCEDURES  Procedure(s) performed: None  Procedures  Critical Care performed: No ____________________________________________   INITIAL IMPRESSION / ASSESSMENT AND PLAN / ED COURSE  Pertinent labs & imaging results that were available during my care of the patient were reviewed by me and considered in my medical decision making (see chart for details).  84 y.o. male with failure to thrive, recent inability to tolerate any food or liquid, presenting to the emergency department to have an IV placed for fluids.  At this time, the patient is clinically severely dehydrated and cachectic.  We will get basic labs but I anticipate that he may have electrolyte  abnormalities or worsening renal function.  I will initiate fluids, laboratory studies, urinalysis, and a screening EKG.  Anticipate the patient will be admitted to the hospital for further evaluation and treatment.  I reviewed the patient's medical chart  ----------------------------------------- 11:09 PM on 05/02/2017 -----------------------------------------  Patient does have evidence of severe dehydration and his laboratory studies.  He is markedly hyponatremic with a renal insufficiency and all of his laboratory studies are most consistent with hemoconcentration.  I will proceed with intravenous fluids and reevaluation.  The patient will be admitted to the hospitalist at this time.  ____________________________________________  FINAL CLINICAL IMPRESSION(S) / ED DIAGNOSES  Final diagnoses:  Hypernatremia  Severe dehydration  Acute on chronic renal insufficiency  Hypotension, unspecified hypotension type         NEW MEDICATIONS STARTED DURING THIS VISIT:  New Prescriptions   No medications on file      Eula Listen, MD 04/17/2017 2343

## 2017-04-21 NOTE — Patient Instructions (Addendum)
1. Check CBC, LFTs, BMP, Mag 2. IVFs if needed 3. Zofran 4mg  SL every 6hrs before trying anything oral 4. Start omeprazole 40mg  PO BID 5. Start fluconazole 200mg  daily for 2weeks, first dose 400mg  6. Low threshold for hospital admission if not improving with above 7. UA and Urine cx to r/o UTI  8. Popsicles, full liquids, low residue diet  Please call our office to speak with my nurse Kami Cmpton at [phone] during business hours from 8am to 4pm if you have any questions/concerns. During after hours, you will be redirected to on call GI physician. For any emergency please call 911 or go the nearest emergency room.    Cephas Darby, MD [address]  [place]  [place], [place] [address]  Main: [phone]  Fax: [phone]

## 2017-04-21 NOTE — ED Triage Notes (Signed)
Pt presents today via [facility] from [facility]. Pt was brought in for IV access. [facility] states facitlity called them out to place IV as they couldn't gain access. [facility] states they needed it to give pt fluids and residental MD states that IV fluids are needed for hydration so EMS brought pt here to ED.

## 2017-04-21 NOTE — Progress Notes (Signed)
Jimmy Darby, MD [address]  [place]  Jimmy Riggs, Jimmy Riggs [address]  Main: [phone]  Fax: [phone]    Gastroenterology Consultation  Referring Provider:     Venia Carbon, MD Primary Care Physician:  Jimmy Carbon, MD Primary Gastroenterologist:  Dr. Cephas Riggs Reason for Consultation:     Intractable vomiting        HPI:   Jimmy Riggs. is a 84 y.o. male referred by Dr. Venia Carbon, MD  for consultation & management of intractable vomiting. He is currently in nursing home at [facility]. Brought by his daughter-in-law. He was recently admitted to hospital secondary to severe UTI and has been on antibiotics. After he was discharged to nursing home, he started picking up his appetite slowly. He had evaluation by speech therapy while in the hospital who recommended dysphagia 3 diet which includes nectar thick liquids and mechanical soft foods. He began vomiting on 03/26/17. Vomiting daily until week of jan 28.Was able to eat solid food and retain.was initially on Zofran, later switched to Phenergan. Did not vomit again until 2/3. Now vomits daily with any intake,cannot keep down clear liquids. He also lost weight.He had BMP done on 04/19/2017 which revealed sodium 159, BP and 49, creatinine 1.69. He was given IV fluids at nursing home same day. Per his daughter-in-law, the check his stool for C. Difficile which was negative after he was discharged from the hospital. He had one watery bowel movement followed by formed stool today, this was after 3 days.  When I asked patient about GI symptoms, he does recall vomiting. He denies epigastric pain, dysphagia, odynophagia, nausea, dysuria, abdominal pain. He is on xarelto for history of A. fib  NSAIDs: none  Antiplts/Anticoagulants/Anti thrombotics: Xarelto  GI Procedures: none Had a cholecystectomy several years ago  Past Medical History:  Diagnosis Date  . Acute intra-cranial hemorrhage (HCC)    w/  coumadin  . Atrial fibrillation ([place])   . Benign prostatic hypertrophy   . Coronary artery disease   . Hyperlipidemia   . Hypertension   . Inguinal hernia   . Lymphoma ([place])   . Melanoma in situ of shoulder ([place])   . NIDDM (non-insulin dependent diabetes mellitus)   . Osteoarthritis   . S/P tonsillectomy and adenoidectomy 1931  . Thrombocytopenia ([place])     Past Surgical History:  Procedure Laterality Date  . CATARACT EXTRACTION    . CHOLECYSTECTOMY    . TONSILLECTOMY AND ADENOIDECTOMY       Current Outpatient Medications:  .  acetaminophen (TYLENOL) 325 MG tablet, Take 650 mg by mouth every 4 (four) hours as needed., Disp: , Rfl:  .  bisacodyl (DULCOLAX) 10 MG suppository, Place 10 mg rectally as needed for moderate constipation., Disp: , Rfl:  .  Dextrose-Sodium Chloride (DEXTROSE 5 % AND 0.45% NACL) infusion, , Disp: , Rfl:  .  diltiazem (CARDIZEM CD) 120 MG 24 hr capsule, TAKE 1 CAPSULE BY MOUTH  DAILY, Disp: 90 capsule, Rfl: 1 .  glucose blood (ONE TOUCH ULTRA TEST) test strip, Use as instructed to test blood sugar once daily dx: E11.40, Disp: 100 each, Rfl: 3 .  Lancets (ONETOUCH ULTRASOFT) lancets, Use as instructed to test blood sugar once daily dx: E11.49, Disp: 100 each, Rfl: 3 .  mirtazapine (REMERON) 15 MG tablet, , Disp: , Rfl:  .  omeprazole (PRILOSEC) 20 MG capsule, , Disp: , Rfl:  .  promethazine (PHENERGAN) 12.5 MG tablet, , Disp: ,  Rfl:  .  SANTYL ointment, , Disp: , Rfl:  .  silver sulfADIAZINE (SILVADENE) 1 % cream, , Disp: , Rfl:  .  sodium chloride 0.9 % infusion, , Disp: , Rfl:  .  triamcinolone cream (KENALOG) 0.1 %, apply to affected area twice a day if needed, Disp: 45 g, Rfl: 2 .  XARELTO 20 MG TABS tablet, TAKE 1 TABLET BY MOUTH ONCE DAILY WITH SUPPER, Disp: 90 tablet, Rfl: 3   Family History  Problem Relation Age of Onset  . Heart failure Mother   . Diabetes Mother   . Hypertension Mother   . Prostate cancer Father   . Arthritis Sister       Social History   Tobacco Use  . Smoking status: Former Smoker    Packs/day: 0.00    Years: 20.00    Pack years: 0.00    Last attempt to quit: 03/14/1980    Years since quitting: 37.1  . Smokeless tobacco: Never Used  Substance Use Topics  . Alcohol use: Yes    Comment: socially  . Drug use: No    Allergies as of 04/26/2017  . (No Known Allergies)    Review of Systems:    All systems reviewed and negative except where noted in HPI.   Physical Exam:  BP 91/60   Pulse 76   Temp (!) 97.4 F (36.3 C) (Oral)   Wt 116 lb 6.4 oz (52.8 kg) Comment: Taken at nursing home on 2/6  BMI 18.23 kg/m  No LMP for male patient.  General:   Alert,  Sitting in wheelchair, cachectic, pleasant and cooperative in NAD Head:  Normocephalic and atraumatic, bitemporal wasting. Eyes:  Sclera clear, no icterus.   Conjunctiva pink. Ears:  Normal auditory acuity. Nose:  No deformity, discharge, or lesions. Mouth:  Dry mucous membranes, tongue is dry, no oral thrush Neck:  Supple; no masses or thyromegaly. Lungs:  Respirations even and unlabored.  Clear throughout to auscultation.   No wheezes, crackles, or rhonchi. No acute distress. Heart:  Regular rate and rhythm; no murmurs, clicks, rubs, or gallops. Abdomen:  Normal bowel sounds. Soft, non-tender and non-distended without masses, hepatosplenomegaly or hernias noted.  No guarding or rebound tenderness.   Rectal: Not performed Msk:  Generalized muscle wasting,Symmetrical without gross deformities. Generalized weakness Pulses:  Normal pulses noted. Extremities:  No clubbing or edema.  No cyanosis. Neurologic:  Alert and oriented x3;  grossly normal neurologically. Skin: dry skin, large ecchymosis in his upper extremities bilateral secondary to anticoagulation. No jaundice. Psych:  Alert and cooperative. Normal mood and affect.  Imaging Studies: No recent abdominal imaging  Assessment and Plan:   Jimmy Riggs. is a 84 y.o. male with  history of diabetes, hypertension, A. Fib on chronic anticoagulation with Xarelto, presents with approximately 2 weeks' history of intractable vomiting, no nausea, associated with weight loss. He is not able to tolerate even liquids, most recent labs revealed worsening BM and creatinine and hypovolemic hypernatremia. His history is suggestive of regurgitation rather than vomiting. Differentials include underlying systemic infection or GI causes such as erosive esophagitis or Candida esophagitis with recent use of antibiotics and hospitalizations. I discussed with his daughter-in-law that his labs are concerning and if is not able to keep anything down, he needs to be admitted to the hospital. She prefers to manage his care at nursing home if possible and if he does not get better then she would take him to the hospital. Therefore, my recommendations are  1. Check CBC, LFTs, BMP, Mag at nursing home 2. IVFs if needed 3. Zofran 4mg  SL every 6hrs before trying anything oral 4. Start omeprazole 40mg  PO BID 5. Start fluconazole 200mg  daily for 2weeks, first dose 400mg  empirically for esophageal candidiasis 6. Low threshold for hospital admission if not improving with above for possible endoscopic evaluation and further management.  7. UA and Urine cx to r/o UTI  8. Popsicles, full liquids, low residue diet 9. If he develops diarrhea, rule out C. difficile   Follow up in 1-2 weeks   Jimmy Darby, MD

## 2017-04-21 NOTE — ED Notes (Signed)
Pt family states pt has not eaten in the last 5 days. Pt has had many ICR looking for c-diff they have all been negative. Pt was getting zofran and was getting sugar water. Pt was in hospital for 5 days for failure to thrive.

## 2017-04-22 ENCOUNTER — Inpatient Hospital Stay: Payer: Medicare Other

## 2017-04-22 ENCOUNTER — Other Ambulatory Visit: Payer: Self-pay

## 2017-04-22 DIAGNOSIS — F0150 Vascular dementia without behavioral disturbance: Secondary | ICD-10-CM | POA: Diagnosis present

## 2017-04-22 DIAGNOSIS — Z87891 Personal history of nicotine dependence: Secondary | ICD-10-CM | POA: Diagnosis not present

## 2017-04-22 DIAGNOSIS — I480 Paroxysmal atrial fibrillation: Secondary | ICD-10-CM | POA: Diagnosis present

## 2017-04-22 DIAGNOSIS — E1122 Type 2 diabetes mellitus with diabetic chronic kidney disease: Secondary | ICD-10-CM | POA: Diagnosis present

## 2017-04-22 DIAGNOSIS — N183 Chronic kidney disease, stage 3 (moderate): Secondary | ICD-10-CM | POA: Diagnosis present

## 2017-04-22 DIAGNOSIS — Z833 Family history of diabetes mellitus: Secondary | ICD-10-CM | POA: Diagnosis not present

## 2017-04-22 DIAGNOSIS — Z66 Do not resuscitate: Secondary | ICD-10-CM | POA: Diagnosis present

## 2017-04-22 DIAGNOSIS — R112 Nausea with vomiting, unspecified: Secondary | ICD-10-CM | POA: Diagnosis not present

## 2017-04-22 DIAGNOSIS — R627 Adult failure to thrive: Secondary | ICD-10-CM | POA: Diagnosis present

## 2017-04-22 DIAGNOSIS — I2510 Atherosclerotic heart disease of native coronary artery without angina pectoris: Secondary | ICD-10-CM | POA: Diagnosis present

## 2017-04-22 DIAGNOSIS — I129 Hypertensive chronic kidney disease with stage 1 through stage 4 chronic kidney disease, or unspecified chronic kidney disease: Secondary | ICD-10-CM | POA: Diagnosis present

## 2017-04-22 DIAGNOSIS — R230 Cyanosis: Secondary | ICD-10-CM | POA: Diagnosis present

## 2017-04-22 DIAGNOSIS — E870 Hyperosmolality and hypernatremia: Secondary | ICD-10-CM | POA: Diagnosis present

## 2017-04-22 DIAGNOSIS — E785 Hyperlipidemia, unspecified: Secondary | ICD-10-CM | POA: Diagnosis present

## 2017-04-22 DIAGNOSIS — K8020 Calculus of gallbladder without cholecystitis without obstruction: Secondary | ICD-10-CM | POA: Diagnosis not present

## 2017-04-22 DIAGNOSIS — I959 Hypotension, unspecified: Secondary | ICD-10-CM | POA: Diagnosis present

## 2017-04-22 DIAGNOSIS — G92 Toxic encephalopathy: Secondary | ICD-10-CM | POA: Diagnosis present

## 2017-04-22 DIAGNOSIS — K121 Other forms of stomatitis: Secondary | ICD-10-CM | POA: Diagnosis not present

## 2017-04-22 DIAGNOSIS — E860 Dehydration: Secondary | ICD-10-CM | POA: Diagnosis present

## 2017-04-22 DIAGNOSIS — Z515 Encounter for palliative care: Secondary | ICD-10-CM | POA: Diagnosis present

## 2017-04-22 DIAGNOSIS — F0280 Dementia in other diseases classified elsewhere without behavioral disturbance: Secondary | ICD-10-CM | POA: Diagnosis present

## 2017-04-22 DIAGNOSIS — N179 Acute kidney failure, unspecified: Secondary | ICD-10-CM | POA: Diagnosis present

## 2017-04-22 DIAGNOSIS — R000 Tachycardia, unspecified: Secondary | ICD-10-CM | POA: Diagnosis present

## 2017-04-22 LAB — TSH: TSH: 7.784 u[IU]/mL — ABNORMAL HIGH (ref 0.350–4.500)

## 2017-04-22 LAB — MRSA PCR SCREENING: MRSA by PCR: NEGATIVE

## 2017-04-22 MED ORDER — BISACODYL 10 MG RE SUPP: 10.0000 mg | RECTAL | Status: DC | PRN

## 2017-04-22 MED ORDER — POLYETHYLENE GLYCOL 3350 17 GM/SCOOP PO POWD: 17.0000 g | Freq: Every day | ORAL | Status: DC

## 2017-04-22 MED ORDER — HEPARIN SODIUM (PORCINE) 5000 UNIT/ML IJ SOLN
5000.0000 [IU] | Freq: Three times a day (TID) | INTRAMUSCULAR | Status: DC
  Administered 2017-04-22: 06:00:00 5000 [IU] via SUBCUTANEOUS
  Filled 2017-04-22: qty 1

## 2017-04-22 MED ORDER — SODIUM CHLORIDE 0.45 % IV SOLN: INTRAVENOUS | Status: DC

## 2017-04-22 MED ORDER — MORPHINE SULFATE (CONCENTRATE) 10 MG/0.5ML PO SOLN: 5.0000 mg | ORAL | Status: DC | PRN

## 2017-04-22 MED ORDER — PROCHLORPERAZINE EDISYLATE 5 MG/ML IJ SOLN
10.0000 mg | Freq: Two times a day (BID) | INTRAMUSCULAR | Status: DC | PRN
  Filled 2017-04-22: qty 2

## 2017-04-22 MED ORDER — HALOPERIDOL LACTATE 2 MG/ML PO CONC
0.5000 mg | ORAL | Status: DC | PRN
Start: 2017-04-22 — End: 2017-04-24
  Filled 2017-04-22: qty 0.3

## 2017-04-22 MED ORDER — ACETAMINOPHEN 650 MG RE SUPP: 650.0000 mg | Freq: Four times a day (QID) | RECTAL | Status: DC | PRN

## 2017-04-22 MED ORDER — ATROPINE SULFATE 1 % OP SOLN
4.0000 [drp] | OPHTHALMIC | Status: DC | PRN
Start: 2017-04-22 — End: 2017-04-24
  Filled 2017-04-22: qty 2

## 2017-04-22 MED ORDER — ONDANSETRON HCL 4 MG/2ML IJ SOLN
4.0000 mg | Freq: Once | INTRAMUSCULAR | Status: AC
  Administered 2017-04-22: 4 mg via INTRAVENOUS

## 2017-04-22 MED ORDER — BENZONATATE 100 MG PO CAPS: 100.0000 mg | ORAL_CAPSULE | Freq: Two times a day (BID) | ORAL | Status: DC | PRN

## 2017-04-22 MED ORDER — MORPHINE SULFATE (CONCENTRATE) 10 MG/0.5ML PO SOLN
5.0000 mg | ORAL | Status: DC | PRN
  Administered 2017-04-22 – 2017-04-23 (×2): 5 mg via ORAL
  Filled 2017-04-22 (×2): qty 1

## 2017-04-22 MED ORDER — HALOPERIDOL 0.5 MG PO TABS
0.5000 mg | ORAL_TABLET | ORAL | Status: DC | PRN
  Filled 2017-04-22: qty 1

## 2017-04-22 MED ORDER — PROCHLORPERAZINE MALEATE 10 MG PO TABS
10.0000 mg | ORAL_TABLET | Freq: Four times a day (QID) | ORAL | Status: DC | PRN
  Filled 2017-04-22: qty 1

## 2017-04-22 MED ORDER — ACETAMINOPHEN 325 MG PO TABS: 650.0000 mg | ORAL_TABLET | Freq: Four times a day (QID) | ORAL | Status: DC | PRN

## 2017-04-22 MED ORDER — ONDANSETRON HCL 4 MG PO TABS: 4.0000 mg | ORAL_TABLET | Freq: Four times a day (QID) | ORAL | Status: DC | PRN

## 2017-04-22 MED ORDER — HALOPERIDOL LACTATE 5 MG/ML IJ SOLN: 0.5000 mg | INTRAMUSCULAR | Status: DC | PRN

## 2017-04-22 MED ORDER — PANTOPRAZOLE SODIUM 40 MG PO TBEC: 40.0000 mg | DELAYED_RELEASE_TABLET | Freq: Every day | ORAL | Status: DC

## 2017-04-22 MED ORDER — ONDANSETRON HCL 4 MG/2ML IJ SOLN
INTRAMUSCULAR | Status: AC
  Filled 2017-04-22: qty 2

## 2017-04-22 MED ORDER — POLYETHYLENE GLYCOL 3350 17 G PO PACK: 17.0000 g | PACK | Freq: Every day | ORAL | Status: DC

## 2017-04-22 MED ORDER — PROCHLORPERAZINE 25 MG RE SUPP
25.0000 mg | Freq: Two times a day (BID) | RECTAL | Status: DC | PRN
  Filled 2017-04-22: qty 1

## 2017-04-22 MED ORDER — FLUCONAZOLE 100 MG PO TABS
200.0000 mg | ORAL_TABLET | Freq: Every day | ORAL | Status: DC
  Filled 2017-04-22: qty 2

## 2017-04-22 MED ORDER — DEXTROSE 5 % IV SOLN
INTRAVENOUS | Status: DC
  Administered 2017-04-22: 05:00:00 via INTRAVENOUS

## 2017-04-22 MED ORDER — DILTIAZEM HCL 30 MG PO TABS: 120.0000 mg | ORAL_TABLET | Freq: Every day | ORAL | Status: DC

## 2017-04-22 MED ORDER — LORAZEPAM 2 MG/ML PO CONC: 1.0000 mg | ORAL | Status: DC | PRN

## 2017-04-22 MED ORDER — ONDANSETRON HCL 4 MG/2ML IJ SOLN
4.0000 mg | Freq: Four times a day (QID) | INTRAMUSCULAR | Status: DC | PRN
Start: 2017-04-22 — End: 2017-04-22

## 2017-04-22 MED ORDER — DOCUSATE SODIUM 100 MG PO CAPS: 100.0000 mg | ORAL_CAPSULE | Freq: Two times a day (BID) | ORAL | Status: DC

## 2017-04-22 MED ORDER — MIRTAZAPINE 15 MG PO TABS: 7.5000 mg | ORAL_TABLET | Freq: Every day | ORAL | Status: DC

## 2017-04-22 NOTE — ED Notes (Signed)
Per conversation with Caren Griffins, RN hold pt

## 2017-04-22 NOTE — ED Notes (Signed)
Pt given popsicle melted down with a small amount of cranberry juice. Pt tolerating well for several spoonfuls. Pt then begins to vomit frothy and bile emesis. Dr Marcille Blanco notified. Zofran ordered received.

## 2017-04-22 NOTE — ED Provider Notes (Signed)
-----------------------------------------   1:07 AM on 04/22/2017 -----------------------------------------  ED ECG REPORT I, Hinda Kehr, the attending physician, personally viewed and interpreted this ECG.  Date: 04/22/2017 EKG Time: 00:47 Rate: 117 Rhythm: atrial fibrillation QRS Axis: normal Intervals: RBBB and LAFB ST/T Wave abnormalities: Non-specific ST segment / T-wave changes, but no evidence of acute ischemia. Narrative Interpretation: no evidence of acute ischemia     Hinda Kehr, MD 04/22/17 7829

## 2017-04-22 NOTE — Progress Notes (Addendum)
Patient's BP 70/30, manually taken 60/30. Dr. Darvin Neighbours at bedside, received order for 1/2 NS bolus 533ml. While calling a rapid response, family discussed with Dr. Darvin Neighbours of how aggressively to treat the patient, family stated they would like the patient to be comfort care. Discontinued rapid response call, both IV accesses infiltrated, received an order to remove as well. Received orders for comfort measures and nurse may pronounce. Discussed the patient's condition of assessment, verbalized understanding.

## 2017-04-22 NOTE — ED Notes (Signed)
Family at bedside. 

## 2017-04-22 NOTE — H&P (Addendum)
Jimmy Riggs. is an 84 y.o. male.   Chief Complaint: No IV access HPI: The patient with past medical history of hypertension, atrial fibrillation, vascular dementia, history of lymphoma, diet-controlled diabetes and coronary artery disease presents to the emergency department from [facility] memory care unit due to inability to establish IV access.  The patient has not been able to keep liquids or solids down for weeks.  He is clinically dehydrated.  Laboratory evaluation also showed hyponatremia as well as acute on chronic kidney injury.  Once IV access was established the emergency department staff called the hospitalist service for further management.  Past Medical History:  Diagnosis Date  . Acute intra-cranial hemorrhage (HCC)    w/ coumadin  . Atrial fibrillation ([place])   . Benign prostatic hypertrophy   . Coronary artery disease   . Hyperlipidemia   . Hypertension   . Inguinal hernia   . Lymphoma ([place])   . Melanoma in situ of shoulder ([place])   . NIDDM (non-insulin dependent diabetes mellitus)   . Osteoarthritis   . S/P tonsillectomy and adenoidectomy 1931  . Thrombocytopenia ([place])     Past Surgical History:  Procedure Laterality Date  . CATARACT EXTRACTION    . CHOLECYSTECTOMY    . TONSILLECTOMY AND ADENOIDECTOMY      Family History  Problem Relation Age of Onset  . Heart failure Mother   . Diabetes Mother   . Hypertension Mother   . Prostate cancer Father   . Arthritis Sister    Social History:  reports that he quit smoking about 37 years ago. He smoked 0.00 packs per day for 20.00 years. he has never used smokeless tobacco. He reports that he drinks alcohol. He reports that he does not use drugs.  Allergies: No Known Allergies   (Not in a hospital admission)  Results for orders placed or performed during the hospital encounter of 05/07/2017 (from the past 48 hour(s))  CBC     Status: Abnormal   Collection Time: 05/04/2017  8:28 PM  Result Value Ref Range   WBC  6.9 3.8 - 10.6 K/uL   RBC 3.83 (L) 4.40 - 5.90 MIL/uL   Hemoglobin 11.9 (L) 13.0 - 18.0 g/dL   HCT 37.9 (L) 40.0 - 52.0 %   MCV 99.0 80.0 - 100.0 fL   MCH 31.1 26.0 - 34.0 pg   MCHC 31.5 (L) 32.0 - 36.0 g/dL   RDW 16.5 (H) 11.5 - 14.5 %   Platelets 173 150 - 440 K/uL    Comment: Performed at [facility], [address] [place], [place] [address]  Comprehensive metabolic panel     Status: Abnormal   Collection Time: 05/02/2017  8:28 PM  Result Value Ref Range   Sodium 162 (HH) 135 - 145 mmol/L    Comment: CRITICAL RESULT CALLED TO, READ BACK BY AND VERIFIED WITH [facility]SIE [facility] AT 2256 04/17/2017.PMH   Potassium 3.6 3.5 - 5.1 mmol/L   Chloride 126 (H) 101 - 111 mmol/L   CO2 22 22 - 32 mmol/L   Glucose, Bld 116 (H) 65 - 99 mg/dL   BUN 49 (H) 6 - 20 mg/dL   Creatinine, Ser 1.83 (H) 0.61 - 1.24 mg/dL   Calcium 9.3 8.9 - 10.3 mg/dL   Total Protein 7.8 6.5 - 8.1 g/dL   Albumin 3.7 3.5 - 5.0 g/dL   AST 26 15 - 41 U/L   ALT 19 17 - 63 U/L   Alkaline Phosphatase 94 38 -  126 U/L   Total Bilirubin 0.9 0.3 - 1.2 mg/dL   GFR calc non Af Amer 31 (L) >60 mL/min   GFR calc Af Amer 36 (L) >60 mL/min    Comment: (NOTE) The eGFR has been calculated using the CKD EPI equation. This calculation has not been validated in all clinical situations. eGFR's persistently <60 mL/min signify possible Chronic Kidney Disease.    Anion gap 14 5 - 15    Comment: Performed at [facility], [address] [place], [place] [address]  Troponin I     Status: None   Collection Time: 05/09/2017  8:28 PM  Result Value Ref Range   Troponin I <0.03 <0.03 ng/mL    Comment: Performed at [facility], [address] [place], [place] [address]  Magnesium     Status: Abnormal   Collection Time: 04/19/2017  8:28 PM  Result Value Ref Range   Magnesium 2.5 (H) 1.7 - 2.4 mg/dL    Comment: Performed at [facility], [address] [place],  [address]   No results found.  Review of  Systems  Unable to perform ROS: Dementia  Gastrointestinal: Positive for vomiting.    Blood pressure 109/62, pulse (!) 108, temperature 98.2 F (36.8 C), temperature source Oral, resp. rate (!) 22, weight 52.6 kg (116 lb), SpO2 100 %. Physical Exam  Vitals reviewed. Constitutional: He is oriented to person, place, and time. He appears well-developed. No distress.  HENT:  Head: Normocephalic and atraumatic.  Mouth/Throat: Oropharynx is clear and moist.  Eyes: Conjunctivae and EOM are normal. Pupils are equal, round, and reactive to light. No scleral icterus.  Neck: Normal range of motion. Neck supple. No JVD present. No tracheal deviation present. No thyromegaly present.  Cardiovascular: Normal rate, regular rhythm and normal heart sounds. Exam reveals no gallop and no friction rub.  No murmur heard. Respiratory: Effort normal and breath sounds normal. No respiratory distress.  GI: Soft. Bowel sounds are normal. He exhibits no distension. There is no tenderness.  Genitourinary:  Genitourinary Comments: Deferred  Musculoskeletal: Normal range of motion. He exhibits no edema.  Lymphadenopathy:    He has no cervical adenopathy.  Neurological: He is alert and oriented to person, place, and time. No cranial nerve deficit.  Skin: Skin is warm and dry. No rash noted. No erythema.  Psychiatric: He has a normal mood and affect. His behavior is normal.  Thought and judgement is difficult to assess as the patient has dementia     Assessment/Plan This is a 84 year old male admitted for hypernatremia. 1.  Hypernatremia: Secondary to poor free water intake.  The patient has been unable to keep liquids or solids down for at least a week and has had poor nutrition for approximately 1 month.  Hydrate with D5W.  Encourage small sips of water.  Monitor sodium levels. 2.  Chronic kidney disease: Acute on chronic.  Hydrate with intravenous fluid.  Avoid nephrotoxic agents. 3.  Intractable nausea and  vomiting: Differential diagnosis includes oral thrush, esophageal spasm or stricture, gastritis, cholecystitis, etc.  The patient will not be able to tolerate oral contrast for CT of his abdomen.  I have ordered an ultrasound of his right upper quadrant to evaluate for gallbladder disease.  Recent workup has included negative C. difficile assays as well as food elimination trials and change to pured diet that has been unsuccessful. 4.  Mouth ulceration: May be simple drying of the mucosa secondary to dehydration but differential diagnosis also  includes vitamin deficiency such as ascorbic acid due to poor nutrition.  The patient is a very lean but does not have muscle wasting.  No rashes noted. 5.  Tachycardia: Persistent; secondary to dehydration.  Continue to volume resuscitate.  The patient has only received 1 L of normal saline at time of admission.  Some blood pressure readings have been low although the patient's cuff is way too big for his arms and has been reading pressures from his forearm.  Improved blood pressure at his leg.  Patient's mentation is the same as baseline for months.  He is alert and interactive. 6.  DVT prophylaxis: Heparin 7.  GI prophylaxis: PPI per home regimen The patient is a DNR.  Time spent on admission orders and patient care approximately 45 minutes  Harrie Foreman, MD 04/22/2017, 4:21 AM

## 2017-04-22 NOTE — Plan of Care (Signed)
  Education: Knowledge of General Education information will improve 11/18/6732 1937 - Not Applicable by Herbie Baltimore, RN   Health Behavior/Discharge Planning: Ability to manage health-related needs will improve 9/0/2409 7353 - Not Applicable by Herbie Baltimore, RN

## 2017-04-22 NOTE — Progress Notes (Addendum)
[place] [facility] [place] NAME: Jimmy Riggs    MR#:  [id number]  DATE OF BIRTH:  1924-05-29  SUBJECTIVE:  CHIEF COMPLAINT:   Chief Complaint  Patient presents with  . Vascular Access Problem   Patient is pleasantly confused.  Has cyanotic fingers and toes.   REVIEW OF SYSTEMS:    Review of Systems  Unable to perform ROS: Dementia    DRUG ALLERGIES:  No Known Allergies  VITALS:  Blood pressure 103/68, pulse (!) 106, temperature (!) 97.5 F (36.4 C), temperature source Oral, resp. rate 15, height 6' (1.829 m), weight 48.5 kg (107 lb), SpO2 99 %.  PHYSICAL EXAMINATION:   Physical Exam  GENERAL:  84 y.o.-year-old patient lying in the bed .  Cyanotic EYES: Pupils equal, round, reactive to light and accommodation. No scleral icterus. Extraocular muscles intact.  HEENT: Head atraumatic, normocephalic. Oropharynx and nasopharynx clear.  NECK:  Supple, no jugular venous distention. No thyroid enlargement, no tenderness.  LUNGS: Coarse breath sounds CARDIOVASCULAR: S1, S2  ABDOMEN: Soft, nontender, nondistended. Bowel sounds present. No organomegaly or mass.  EXTREMITIES: No , clubbing or edema b/l.   Cyanosis present  PSYCHIATRIC: The patient is drowsy  LABORATORY PANEL:   CBC Recent Labs  Lab 04/26/2017 2028  WBC 6.9  HGB 11.9*  HCT 37.9*  PLT 173   ------------------------------------------------------------------------------------------------------------------ Chemistries  Recent Labs  Lab 05/07/2017 2028  NA 162*  K 3.6  CL 126*  CO2 22  GLUCOSE 116*  BUN 49*  CREATININE 1.83*  CALCIUM 9.3  MG 2.5*  AST 26  ALT 19  ALKPHOS 94  BILITOT 0.9   ------------------------------------------------------------------------------------------------------------------  Cardiac Enzymes Recent Labs  Lab 04/23/2017 2028  TROPONINI <0.03    ------------------------------------------------------------------------------------------------------------------  RADIOLOGY:  Ct Abdomen Pelvis Wo Contrast  Result Date: 04/22/2017 CLINICAL DATA:  Cholelithiasis. EXAM: CT ABDOMEN AND PELVIS WITHOUT CONTRAST TECHNIQUE: Multidetector CT imaging of the abdomen and pelvis was performed following the standard protocol without IV contrast. COMPARISON:  Ultrasound 02/08/2017 FINDINGS: Lower chest: Dense coronary artery calcifications noted. Heart is normal size. Lung bases clear. No effusions. Hepatobiliary: No focal liver abnormality is seen. Status post cholecystectomy. No biliary dilatation. Pancreas: No focal abnormality or ductal dilatation. Spleen: No focal abnormality.  Normal size. Adrenals/Urinary Tract: Mild pelvicaliectasis on the right. No visible renal or ureteral stones. But no visible focal renal abnormality. Adrenal glands and urinary bladder unremarkable. Stomach/Bowel: Scattered sigmoid diverticula. No active diverticulitis. Stomach, large and small bowel grossly unremarkable. Vascular/Lymphatic: Diffuse aortic and iliac calcifications. Mild infrarenal abdominal aortic ectasia measuring 2.5 cm. No adenopathy. Reproductive: Enlarged prostate. Other: No free fluid or free air. Musculoskeletal: Diffuse degenerative changes throughout the lumbar spine. IMPRESSION: No renal or ureteral stones.  Mild pelvicaliectasis on the right. Enlarged prostate. Prior cholecystectomy. Coronary artery disease, aortic atherosclerosis. Ectasia of the infrarenal aorta, 2.5 cm. Electronically Signed   By: Rolm Baptise M.D.   On: 04/22/2017 09:38     ASSESSMENT AND PLAN:   *Hypotension likely due to dehydration.  Concern for sepsis. Due to cyanosis and low saturations blood pressure was checked and found to be 56/40. Will check lactic acid.  Bolus fluids stat 500 mL. Blood cultures.  *Hypernatremia with severe dehydration Continue D5. Repeat  sodium.  *Acute kidney injury over CKD stage III.  On IV fluids.  Repeat labs.  Monitor input and output.  *Chronic vomiting.  CT scan of the abdomen checked and shows nothing acute.  *Acute encephalopathy over  dementia due to dehydration and hypernatremia.  Patient is critically ill with high risk for deterioration/death.  No improvement in spite of aggressive care.  Will discussed with family regarding goals of care.  Seems appropriate for comfort measures at this time.  All the records are reviewed and case discussed with Care Management/Social Worker Management plans discussed with the patient, family and they are in agreement.  CODE STATUS: DNR  DVT Prophylaxis: SCDs  TOTAL CC TIME TAKING CARE OF THIS PATIENT: 50 minutes.   POSSIBLE D/C IN 1-2 DAYS, DEPENDING ON CLINICAL CONDITION.  [name] Hephzibah Strehle M.D on 04/22/2017 at 1:44 PM  Between 7am to 6pm - Pager - [phone]  After 6pm go to [email or web address] - password [email or web address] [facility]  Office  [phone]  CC: Primary care physician; Venia Carbon, MD  Note: This dictation was prepared with Dragon dictation along with smaller phrase technology. Any transcriptional errors that result from this process are unintentional.

## 2017-04-22 NOTE — Progress Notes (Signed)
Patient critically ill and with dementia unable to make decisions.  Called his healthcare power of attorney Marquice Uddin and at [phone].  His son.  Discussed regarding patient's critical nature of illness with high risk for deterioration and death.  We discussed regarding his hypernatremia/dehydration/dementia and possible sepsis.  Discussed regarding aggressive care versus palliative approach.  Son explains that his diet has declined over the last few months with dementia/poor appetite and vomiting over the last 2 weeks.  Explained that CT scan of the abdomen was normal.  Patient if needing aggressive care will have to be transferred to ICU with central line and pressor support.  Confirmed he is DNR DNI.  Patient seems appropriate for comfort measures at this time and son agrees.  Discussed regarding starting morphine and Ativan as needed with no new IV lines or lab draws.  Complete comfort measures ordered.  Critical care time spent 30 minutes

## 2017-04-22 NOTE — Clinical Social Work Note (Signed)
CSW aware through chart review that this patient admitted from [facility]. CSW will assess when able.   Santiago Bumpers, MSW, Latanya Presser [phone]

## 2017-04-23 MED ORDER — MORPHINE SULFATE (CONCENTRATE) 10 MG/0.5ML PO SOLN
10.0000 mg | ORAL | Status: DC | PRN
Start: 2017-04-23 — End: 2017-04-24
  Administered 2017-04-23 – 2017-04-24 (×2): 10 mg via SUBLINGUAL
  Filled 2017-04-23 (×2): qty 1

## 2017-04-23 NOTE — Progress Notes (Signed)
Nutrition Brief Note  Patient identified to be seen for Malnutrition Screening Tool (MST). Chart reviewed. Patient has transitioned to comfort care.   No nutrition interventions warranted at this time. Please consult RD as needed.   Zilphia Kozinski, MS, RD, LDN Office: [phone] Pager: [phone] After Hours/Weekend Pager: [phone]  

## 2017-04-23 NOTE — Clinical Social Work Note (Signed)
Clinical Social Work Assessment  Patient Details  Name: Jimmy Riggs. MRN: [id number] Date of Birth: 11-Aug-1924  Date of referral:  04/23/17               Reason for consult:  Facility Placement, End of Life/Hospice                Permission sought to share information with:  Facility Art therapist granted to share information::  Yes, Verbal Permission Granted  Name::        Agency::     Relationship::     Contact Information:     Housing/Transportation Living arrangements for the past 2 months:  [place] of Information:  Medical Team, Facility, Adult Children Patient Interpreter Needed:  None Criminal Activity/Legal Involvement Pertinent to Current Situation/Hospitalization:  No - Comment as needed Significant Relationships:  Adult Children, Community Support Lives with:  Facility Resident Do you feel safe going back to the place where you live?  No(Patient is at end of life) Need for family participation in patient care:  Yes (Comment)(Patient has dementia and AMS at this time)  Care giving concerns:  Patient at end of life   Social Worker assessment / plan: CSW was updated by the medical team that the family wishes to pursue hospice facility placement due to terminal illness. The CSW attempted to contact the admissions coordinator at [facility] [place] to inquire about bed availability. The CSW did not receive an answer. The CSW has asked that the admissions coordinator be paged. Currently, the hospice home does not seem to have bed availability based on the inability to respond at this time. The CSW has sent a referral via fax to the admissions staff.  The patient was most recently a [facility] ALF/MCU resident. CSW is following.  Employment status:  Retired Forensic scientist:  Medicare PT Recommendations:  Not assessed at this time [place] / Referral to community resources:  Other (Comment  Required)([facility])  Patient/Family's Response to care:  The family was unavailable at the time of assessment  Patient/Family's Understanding of and Emotional Response to Diagnosis, Current Treatment, and Prognosis: The family is currently contacting extended family to begin end of life visits.  Emotional Assessment Appearance:  Appears stated age Attitude/Demeanor/Rapport:  Lethargic Affect (typically observed):  Stable Orientation:  Oriented to Self Alcohol / Substance use:  Never Used Psych involvement (Current and /or in the community):  No (Comment)  Discharge Needs  Concerns to be addressed:  Discharge Planning Concerns, Grief and Loss Concerns Readmission within the last 30 days:  Yes Current discharge risk:  Terminally ill Barriers to Discharge:  Hospice Bed not available   Zettie Pho, LCSW 04/23/2017, 2:06 PM

## 2017-04-23 NOTE — Progress Notes (Signed)
[facility] [place] NAME: Jimmy Riggs    MR#:  [id number]  DATE OF BIRTH:  1925/01/04  SUBJECTIVE:  CHIEF COMPLAINT:   Chief Complaint  Patient presents with  . Vascular Access Problem   Patient is pleasantly confused.  Has cyanotic fingers and toes.   REVIEW OF SYSTEMS:    Review of Systems  Unable to perform ROS: Dementia    DRUG ALLERGIES:  No Known Allergies  VITALS:  Blood pressure (!) 70/30, pulse (!) 106, temperature (!) 97.5 F (36.4 C), temperature source Oral, resp. rate 15, height 6' (1.829 m), weight 48.5 kg (107 lb), SpO2 (!) 80 %.  PHYSICAL EXAMINATION:   Physical Exam  GENERAL:  84 y.o.-year-old patient lying in the bed .Cyanotic HEENT: Dry oral mucosa LUNGS: Coarse breath sounds CARDIOVASCULAR: S1, S2  EXTREMITIES: No , clubbing or edema b/l.   Cyanosis present PSYCHIATRIC: The patient is drowsy  LABORATORY PANEL:   CBC Recent Labs  Lab 04/16/2017 2028  WBC 6.9  HGB 11.9*  HCT 37.9*  PLT 173   ------------------------------------------------------------------------------------------------------------------ Chemistries  Recent Labs  Lab 04/26/2017 2028  NA 162*  K 3.6  CL 126*  CO2 22  GLUCOSE 116*  BUN 49*  CREATININE 1.83*  CALCIUM 9.3  MG 2.5*  AST 26  ALT 19  ALKPHOS 94  BILITOT 0.9   ------------------------------------------------------------------------------------------------------------------  Cardiac Enzymes Recent Labs  Lab 05/09/2017 2028  TROPONINI <0.03   ------------------------------------------------------------------------------------------------------------------  RADIOLOGY:  Ct Abdomen Pelvis Wo Contrast  Result Date: 04/22/2017 CLINICAL DATA:  Cholelithiasis. EXAM: CT ABDOMEN AND PELVIS WITHOUT CONTRAST TECHNIQUE: Multidetector CT imaging of the abdomen and pelvis was performed following the standard protocol without IV contrast. COMPARISON:  Ultrasound 02/08/2017  FINDINGS: Lower chest: Dense coronary artery calcifications noted. Heart is normal size. Lung bases clear. No effusions. Hepatobiliary: No focal liver abnormality is seen. Status post cholecystectomy. No biliary dilatation. Pancreas: No focal abnormality or ductal dilatation. Spleen: No focal abnormality.  Normal size. Adrenals/Urinary Tract: Mild pelvicaliectasis on the right. No visible renal or ureteral stones. But no visible focal renal abnormality. Adrenal glands and urinary bladder unremarkable. Stomach/Bowel: Scattered sigmoid diverticula. No active diverticulitis. Stomach, large and small bowel grossly unremarkable. Vascular/Lymphatic: Diffuse aortic and iliac calcifications. Mild infrarenal abdominal aortic ectasia measuring 2.5 cm. No adenopathy. Reproductive: Enlarged prostate. Other: No free fluid or free air. Musculoskeletal: Diffuse degenerative changes throughout the lumbar spine. IMPRESSION: No renal or ureteral stones.  Mild pelvicaliectasis on the right. Enlarged prostate. Prior cholecystectomy. Coronary artery disease, aortic atherosclerosis. Ectasia of the infrarenal aorta, 2.5 cm. Electronically Signed   By: Rolm Baptise M.D.   On: 04/22/2017 09:38     ASSESSMENT AND PLAN:   *Hypotension likely due to dehydration.  *Hypernatremia with severe dehydration *Acute kidney injury over CKD stage III.  *Chronic vomiting.  *Acute toxic and metabolic encephalopathy over dementia  On comfort measures. Increase dose of morphine.  Time spent 25 minutes.>50% time spent in discussing with family at bedside and co-ordinating care  All the records are reviewed and case discussed with Care Management/Social Worker Management plans discussed with the patient, family and they are in agreement.  CODE STATUS: DNR  TOTAL  TIME TAKING CARE OF THIS PATIENT: 25 minutes.   Jimmy Riggs M.D on 04/23/2017 at 1:34 PM  Between 7am to 6pm - Pager - [phone]  After 6pm go to [email or web address] -  password [email or web address] [facility]  Office  [phone]  CC: Primary care physician; Jimmy Carbon, MD  Note: This dictation was prepared with Dragon dictation along with smaller phrase technology. Any transcriptional errors that result from this process are unintentional.

## 2017-04-23 NOTE — Clinical Social Work Note (Signed)
CSW received verbal consult that this patient has transitioned to comfort care and is seeking possible discharge to hospice facility. CSW has sent the referral for such. Currently, the [facility] is not able to accept due to bed availability. CSW will continue to follow for support.  Santiago Bumpers, MSW, Latanya Presser [phone]

## 2017-05-01 ENCOUNTER — Ambulatory Visit: Payer: Medicare Other | Admitting: Gastroenterology

## 2017-05-12 NOTE — Care Management Important Message (Signed)
Important Message  Patient Details  Name: Jimmy Riggs. MRN: [id number] Date of Birth: 02-Apr-1924   Medicare Important Message Given:  Yes  Signed by son/POA    Shelbie Ammons, RN May 20, 2017, 8:30 AM

## 2017-05-12 NOTE — Progress Notes (Signed)
Picked up in room by [facility], Corinna Capra. Family present.

## 2017-05-12 NOTE — Progress Notes (Signed)
Called to room by patient's son. Upon assessment, pt is not breathing, has no pulse. Andee Poles, RN helped verify patient's death at 07:42. Supervisor notified, MD paged to notify, [facility] will be notified. Ammie Dalton, RN

## 2017-05-12 NOTE — Death Summary Note (Signed)
DEATH SUMMARY   Patient Details  Name: Jimmy Riggs. MRN: [id number] DOB: 1924-08-12  Admission/Discharge Information   Admit Date:  2017-05-15  Date of Death: Date of Death: 2017-05-18  Time of Death: Time of Death: 0742  Length of Stay: 2  Referring Physician: Venia Carbon, MD   Reason(s) for Hospitalization  Dehydration Hypernatremia Acute encephalopathy over baseline dementia  Diagnoses  Preliminary cause of death:   Dehydration with hypernatremia Secondary Diagnoses (including complications and co-morbidities):  Active Problems:   Hypernatremia  *Hypotension likely due to dehydration.  *Hypernatremia with severe dehydration *Acute kidney injury over CKD stage III.  *Chronic vomiting.  *Acute toxic and metabolic encephalopathy over dementia    Brief Hospital Course (including significant findings, care, treatment, and services provided and events leading to death)  Jimmy Riggs. is a 84 y.o. year old male who who was a resident of local nursing home with history of dementia with progressive decline over the past few months was admitted to the hospital due to hyponatremia, dehydration, weakness and acute encephalopathy over his baseline dementia.  Patient was started on IV fluids.  He continued to be hypotensive and turning cyanotic.  At this point discussed with family who have decided to start comfort measures on the patient.  Patient was started on as needed morphine in the hospital.  He was pronounced dead on May 18, 2017 at 7:40 AM.   Pertinent Labs and Studies  Significant Diagnostic Studies Ct Abdomen Pelvis Wo Contrast  Result Date: 04/22/2017 CLINICAL DATA:  Cholelithiasis. EXAM: CT ABDOMEN AND PELVIS WITHOUT CONTRAST TECHNIQUE: Multidetector CT imaging of the abdomen and pelvis was performed following the standard protocol without IV contrast. COMPARISON:  Ultrasound 02/08/2017 FINDINGS: Lower chest: Dense coronary artery calcifications noted. Heart is  normal size. Lung bases clear. No effusions. Hepatobiliary: No focal liver abnormality is seen. Status post cholecystectomy. No biliary dilatation. Pancreas: No focal abnormality or ductal dilatation. Spleen: No focal abnormality.  Normal size. Adrenals/Urinary Tract: Mild pelvicaliectasis on the right. No visible renal or ureteral stones. But no visible focal renal abnormality. Adrenal glands and urinary bladder unremarkable. Stomach/Bowel: Scattered sigmoid diverticula. No active diverticulitis. Stomach, large and small bowel grossly unremarkable. Vascular/Lymphatic: Diffuse aortic and iliac calcifications. Mild infrarenal abdominal aortic ectasia measuring 2.5 cm. No adenopathy. Reproductive: Enlarged prostate. Other: No free fluid or free air. Musculoskeletal: Diffuse degenerative changes throughout the lumbar spine. IMPRESSION: No renal or ureteral stones.  Mild pelvicaliectasis on the right. Enlarged prostate. Prior cholecystectomy. Coronary artery disease, aortic atherosclerosis. Ectasia of the infrarenal aorta, 2.5 cm. Electronically Signed   By: Rolm Baptise M.D.   On: 04/22/2017 09:38    Microbiology Recent Results (from the past 240 hour(s))  MRSA PCR Screening     Status: None   Collection Time: 04/22/17  4:47 AM  Result Value Ref Range Status   MRSA by PCR NEGATIVE NEGATIVE Final    Comment:        The GeneXpert MRSA Assay (FDA approved for NASAL specimens only), is one component of a comprehensive MRSA colonization surveillance program. It is not intended to diagnose MRSA infection nor to guide or monitor treatment for MRSA infections. Performed at [facility], [address] [place], [place] [address]     Lab Basic Metabolic Panel: Recent Labs  Lab 05/15/2017 07-04-2026  NA 162*  K 3.6  CL 126*  CO2 22  GLUCOSE 116*  BUN 49*  CREATININE 1.83*  CALCIUM 9.3  MG 2.5*   Liver Function  Tests: Recent Labs  Lab 05/08/2017 2028  AST 26  ALT 19  ALKPHOS 94   BILITOT 0.9  PROT 7.8  ALBUMIN 3.7   No results for input(s): LIPASE, AMYLASE in the last 168 hours. No results for input(s): AMMONIA in the last 168 hours. CBC: Recent Labs  Lab 04/27/2017 2028  WBC 6.9  HGB 11.9*  HCT 37.9*  MCV 99.0  PLT 173   Cardiac Enzymes: Recent Labs  Lab 05/10/2017 2028  TROPONINI <0.03   Sepsis Labs: Recent Labs  Lab 04/18/2017 2028  WBC 6.9    Procedures/Operations  None   Jimmy Riggs 04/28/2017, 3:34 PM

## 2017-05-12 DEATH — deceased

## 2018-10-07 IMAGING — RF DG SWALLOWING FUNCTION - NRPT [facility]
12 of 15 series · 13 of 24 positions shown · non-contrast
Comparison: none

[Series 2: run · 1 of 25 frames shown (1 of 12)]
[frame 4/25]
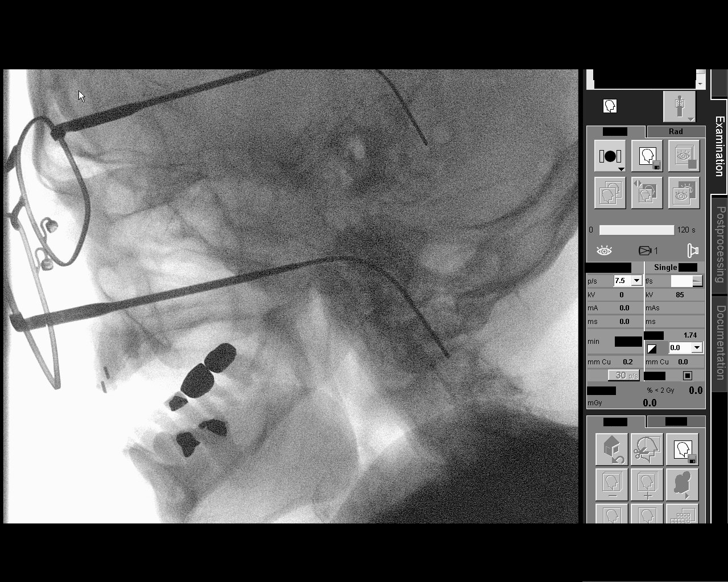

[Series 3: run · 1 of 26 frames shown (2 of 12)]
[frame 22/26]
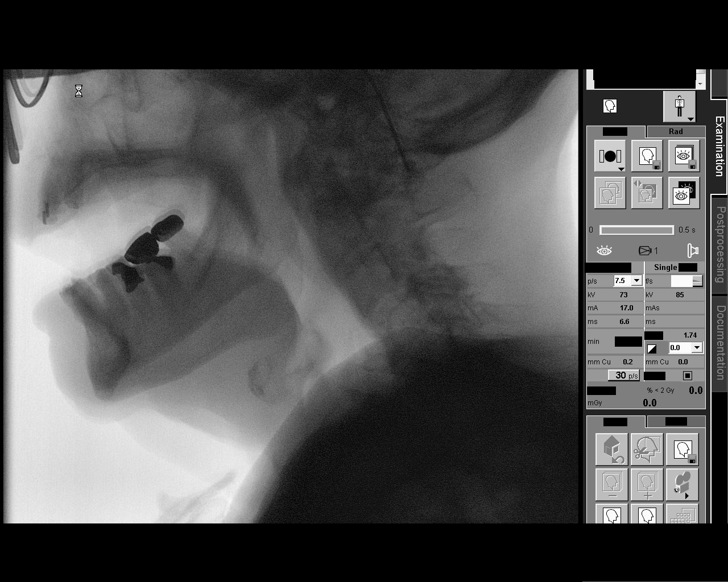

[Series 5: run · 1 of 249 frames shown (3 of 12)]
[frame 38/249]
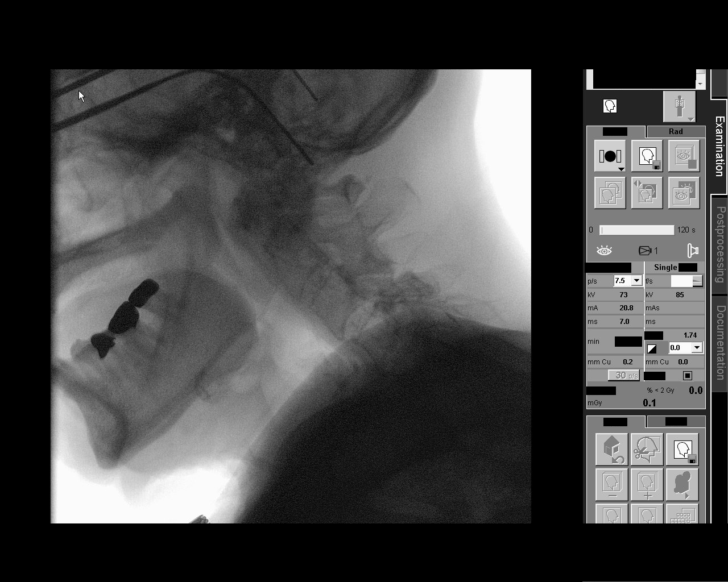

[Series 6: run · 1 of 257 frames shown (4 of 12)]
[frame 129/257]
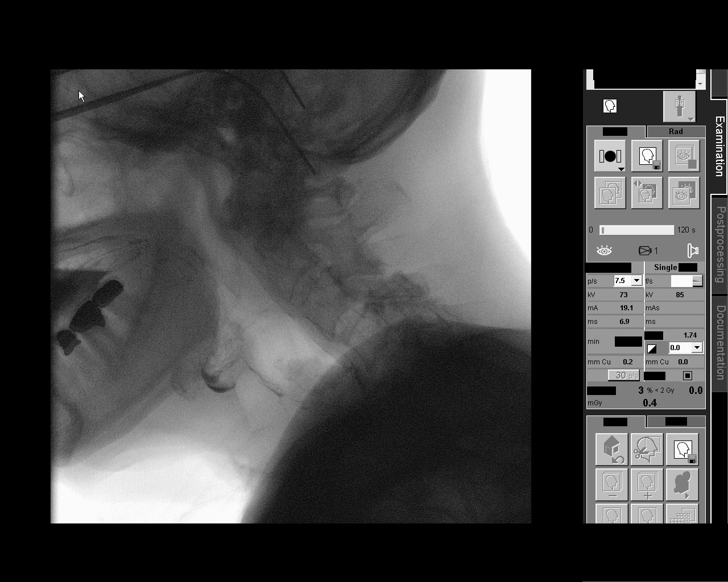

[Series 7: run · 1 of 26 frames shown (5 of 12)]
[frame 23/26]
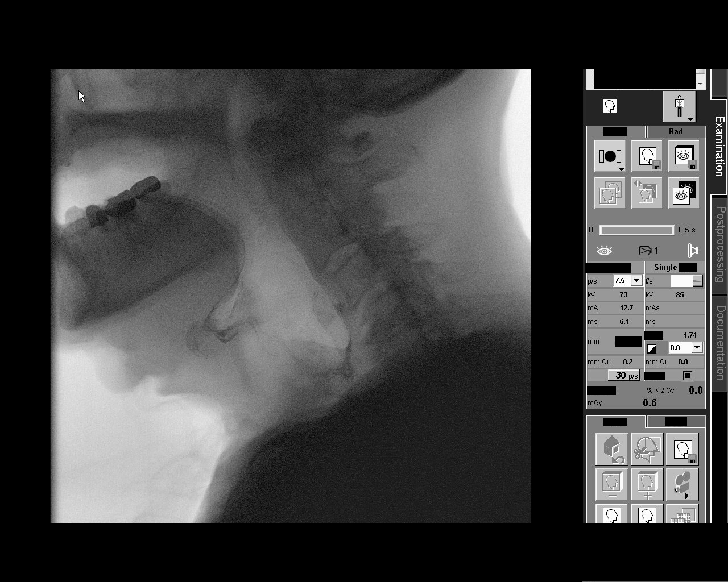

[Series 8: run · 1 of 477 frames shown (6 of 12)]
[frame 475/477]
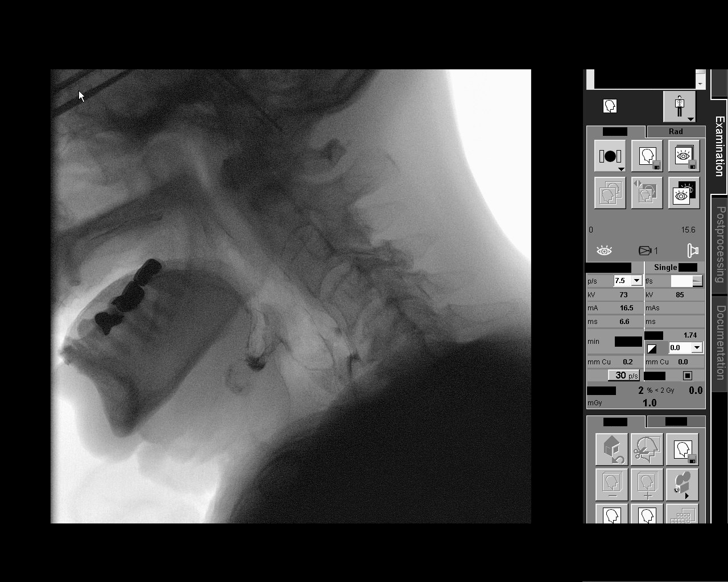

[Series 10: run · 2 of 280 frames shown (7 of 12)]
[frame 43/280]
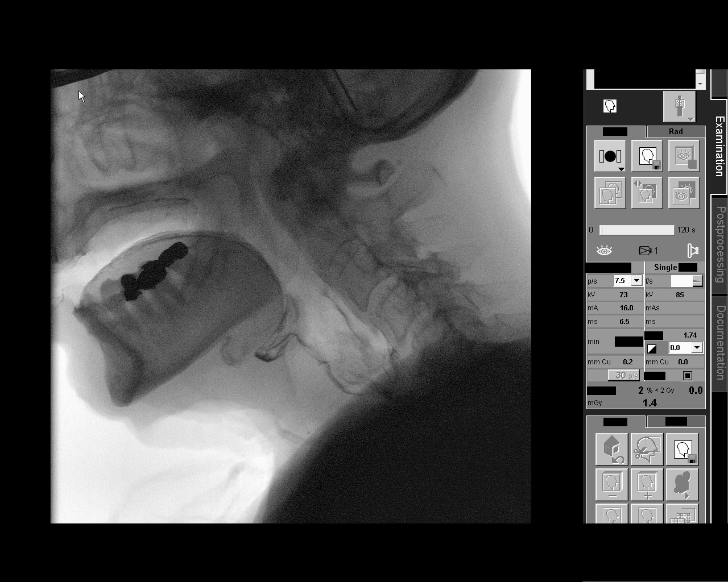
[frame 141/280]
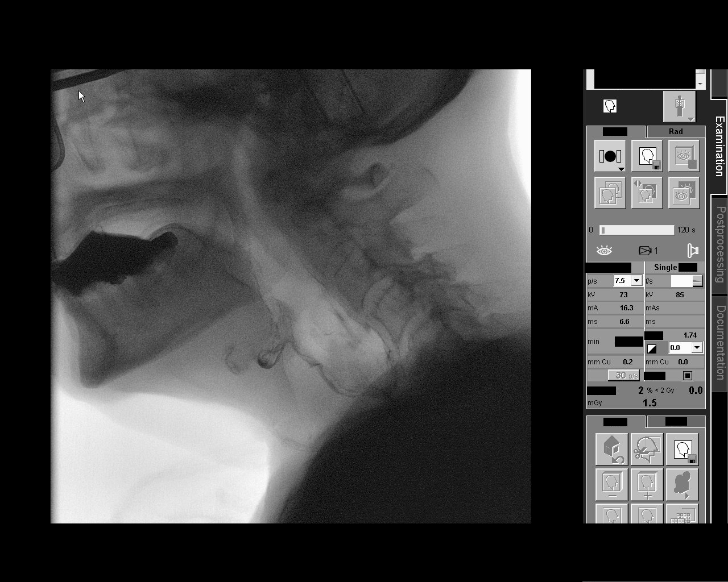

[Series 11: run · 1 of 184 frames shown (8 of 12)]
[frame 157/184]
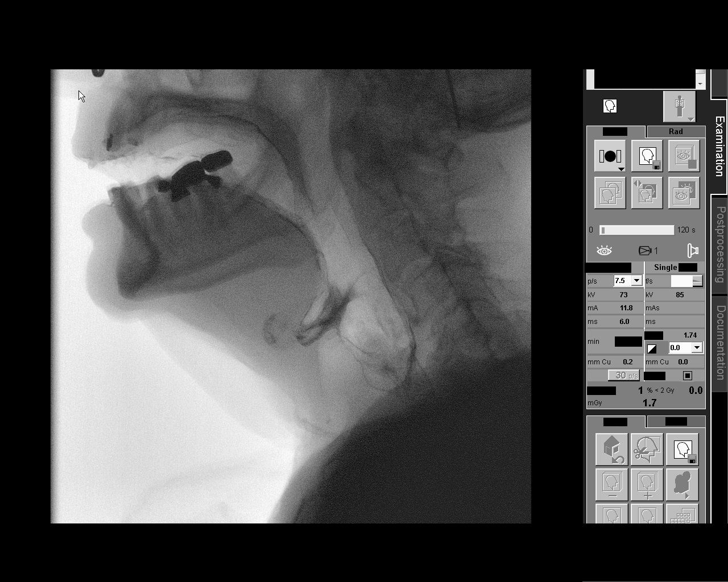

[Series 13: run · 1 of 554 frames shown (9 of 12)]
[frame 84/554]
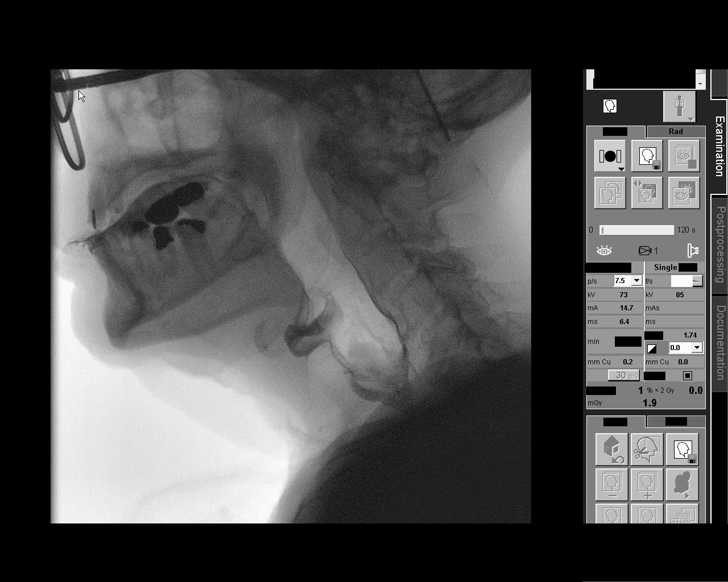

[Series 14: run · 1 of 307 frames shown (10 of 12)]
[frame 93/307]
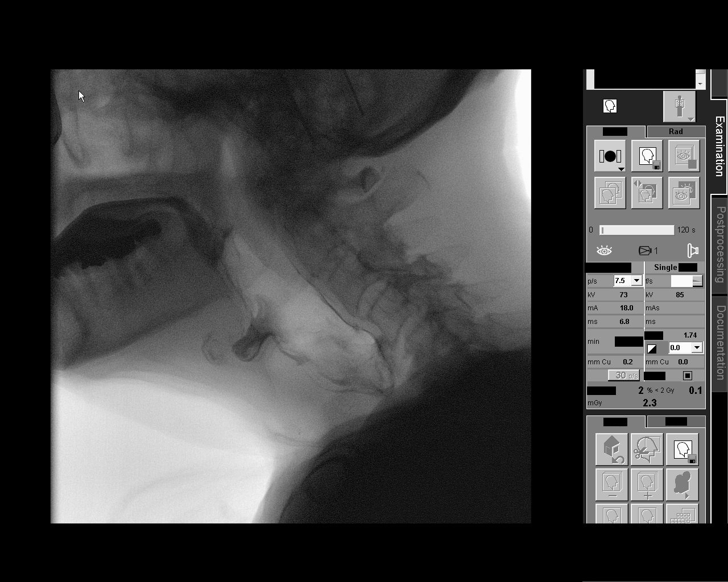

[Series 15: run · 1 of 59 frames shown (11 of 12)]
[frame 51/59]
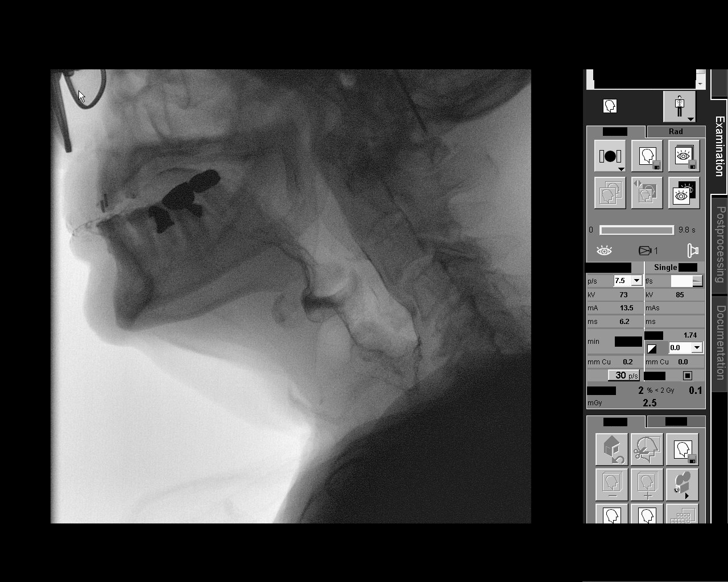

[Series 16: run · 1 of 209 frames shown (12 of 12)]
[frame 178/209]
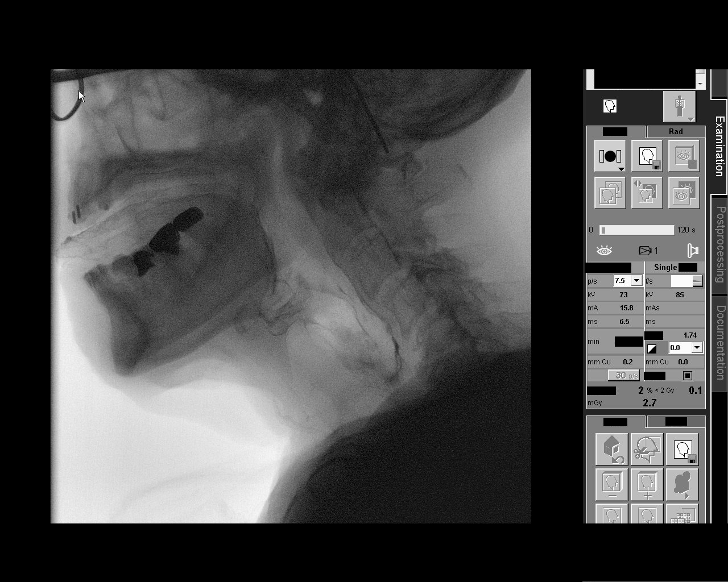

[13 of 24 positions shown; findings below may reference images not displayed]

FLUOROSCOPY FOR SWALLOWING FUNCTION STUDY:
Fluoroscopy was provided for swallowing function study, which was administered by a speech pathologist.  Final results and recommendations from this study are contained within the speech pathology report.
